# Patient Record
Sex: Female | Born: 1950
Health system: Southern US, Community
[De-identification: ages and names within clinical notes are randomized; demographics above are authoritative.]

## PROBLEM LIST (undated history)

## (undated) DIAGNOSIS — I10 Essential (primary) hypertension: Secondary | ICD-10-CM

## (undated) DIAGNOSIS — M1990 Unspecified osteoarthritis, unspecified site: Secondary | ICD-10-CM

## (undated) DIAGNOSIS — K219 Gastro-esophageal reflux disease without esophagitis: Secondary | ICD-10-CM

## (undated) DIAGNOSIS — R519 Headache, unspecified: Secondary | ICD-10-CM

## (undated) DIAGNOSIS — T7840XA Allergy, unspecified, initial encounter: Secondary | ICD-10-CM

## (undated) DIAGNOSIS — E7800 Pure hypercholesterolemia, unspecified: Secondary | ICD-10-CM

## (undated) DIAGNOSIS — I639 Cerebral infarction, unspecified: Secondary | ICD-10-CM

## (undated) DIAGNOSIS — E119 Type 2 diabetes mellitus without complications: Secondary | ICD-10-CM

## (undated) DIAGNOSIS — R51 Headache: Secondary | ICD-10-CM

## (undated) DIAGNOSIS — G4730 Sleep apnea, unspecified: Secondary | ICD-10-CM

## (undated) HISTORY — PX: KNEE ARTHROSCOPY: SUR90

## (undated) HISTORY — PX: TUBAL LIGATION: SHX77

## (undated) HISTORY — DX: Allergy, unspecified, initial encounter: T78.40XA

## (undated) HISTORY — PX: ABDOMINAL HYSTERECTOMY: SHX81

## (undated) HISTORY — DX: Unspecified osteoarthritis, unspecified site: M19.90

## (undated) HISTORY — PX: SHOULDER ARTHROSCOPY: SHX128

## (undated) HISTORY — PX: FOOT SURGERY: SHX648

---

## 1999-11-22 ENCOUNTER — Emergency Department (HOSPITAL_COMMUNITY): Admission: EM | Admit: 1999-11-22 | Discharge: 1999-11-22 | Payer: Self-pay | Admitting: *Deleted

## 2000-04-24 ENCOUNTER — Encounter: Admission: RE | Admit: 2000-04-24 | Discharge: 2000-04-24 | Payer: Self-pay | Admitting: Emergency Medicine

## 2000-04-24 ENCOUNTER — Encounter: Payer: Self-pay | Admitting: Emergency Medicine

## 2001-03-27 ENCOUNTER — Encounter: Payer: Self-pay | Admitting: Neurology

## 2001-03-27 ENCOUNTER — Encounter: Payer: Self-pay | Admitting: Emergency Medicine

## 2001-03-27 ENCOUNTER — Inpatient Hospital Stay (HOSPITAL_COMMUNITY): Admission: EM | Admit: 2001-03-27 | Discharge: 2001-03-29 | Payer: Self-pay | Admitting: Emergency Medicine

## 2002-07-14 ENCOUNTER — Encounter: Admission: RE | Admit: 2002-07-14 | Discharge: 2002-07-14 | Payer: Self-pay | Admitting: Hematology and Oncology

## 2002-07-14 ENCOUNTER — Encounter: Payer: Self-pay | Admitting: Internal Medicine

## 2002-07-16 HISTORY — PX: CHEST TUBE INSERTION: SHX231

## 2002-07-30 ENCOUNTER — Encounter: Admission: RE | Admit: 2002-07-30 | Discharge: 2002-07-30 | Payer: Self-pay | Admitting: Internal Medicine

## 2002-07-30 ENCOUNTER — Encounter: Payer: Self-pay | Admitting: Internal Medicine

## 2002-08-10 ENCOUNTER — Encounter: Payer: Self-pay | Admitting: Obstetrics and Gynecology

## 2002-08-10 ENCOUNTER — Ambulatory Visit (HOSPITAL_COMMUNITY): Admission: RE | Admit: 2002-08-10 | Discharge: 2002-08-10 | Payer: Self-pay | Admitting: Obstetrics and Gynecology

## 2002-10-21 ENCOUNTER — Ambulatory Visit (HOSPITAL_COMMUNITY): Admission: RE | Admit: 2002-10-21 | Discharge: 2002-10-21 | Payer: Self-pay | Admitting: Gastroenterology

## 2002-10-22 ENCOUNTER — Encounter: Payer: Self-pay | Admitting: Emergency Medicine

## 2002-10-22 ENCOUNTER — Encounter: Payer: Self-pay | Admitting: Neurology

## 2002-10-22 ENCOUNTER — Inpatient Hospital Stay (HOSPITAL_COMMUNITY): Admission: EM | Admit: 2002-10-22 | Discharge: 2002-10-23 | Payer: Self-pay | Admitting: Emergency Medicine

## 2002-10-23 ENCOUNTER — Encounter: Payer: Self-pay | Admitting: Neurology

## 2002-11-11 ENCOUNTER — Ambulatory Visit (HOSPITAL_COMMUNITY): Admission: RE | Admit: 2002-11-11 | Discharge: 2002-11-11 | Payer: Self-pay | Admitting: Neurology

## 2002-11-11 ENCOUNTER — Encounter: Payer: Self-pay | Admitting: Cardiology

## 2003-05-11 ENCOUNTER — Emergency Department (HOSPITAL_COMMUNITY): Admission: EM | Admit: 2003-05-11 | Discharge: 2003-05-12 | Payer: Self-pay | Admitting: Emergency Medicine

## 2003-07-17 DIAGNOSIS — I639 Cerebral infarction, unspecified: Secondary | ICD-10-CM

## 2003-07-17 HISTORY — DX: Cerebral infarction, unspecified: I63.9

## 2003-07-28 ENCOUNTER — Ambulatory Visit (HOSPITAL_COMMUNITY): Admission: RE | Admit: 2003-07-28 | Discharge: 2003-07-28 | Payer: Self-pay | Admitting: Orthopedic Surgery

## 2003-07-28 ENCOUNTER — Ambulatory Visit (HOSPITAL_BASED_OUTPATIENT_CLINIC_OR_DEPARTMENT_OTHER): Admission: RE | Admit: 2003-07-28 | Discharge: 2003-07-28 | Payer: Self-pay | Admitting: Orthopedic Surgery

## 2003-09-01 ENCOUNTER — Ambulatory Visit (HOSPITAL_COMMUNITY): Admission: RE | Admit: 2003-09-01 | Discharge: 2003-09-01 | Payer: Self-pay | Admitting: Orthopedic Surgery

## 2003-09-01 ENCOUNTER — Inpatient Hospital Stay (HOSPITAL_COMMUNITY): Admission: AD | Admit: 2003-09-01 | Discharge: 2003-09-05 | Payer: Self-pay | Admitting: Orthopedic Surgery

## 2003-09-01 ENCOUNTER — Ambulatory Visit (HOSPITAL_BASED_OUTPATIENT_CLINIC_OR_DEPARTMENT_OTHER): Admission: RE | Admit: 2003-09-01 | Discharge: 2003-09-01 | Payer: Self-pay | Admitting: Orthopedic Surgery

## 2003-09-08 ENCOUNTER — Ambulatory Visit (HOSPITAL_COMMUNITY): Admission: RE | Admit: 2003-09-08 | Discharge: 2003-09-08 | Payer: Self-pay | Admitting: Surgery

## 2003-09-30 ENCOUNTER — Encounter: Admission: RE | Admit: 2003-09-30 | Discharge: 2003-09-30 | Payer: Self-pay | Admitting: Surgery

## 2003-10-25 ENCOUNTER — Ambulatory Visit (HOSPITAL_BASED_OUTPATIENT_CLINIC_OR_DEPARTMENT_OTHER): Admission: RE | Admit: 2003-10-25 | Discharge: 2003-10-25 | Payer: Self-pay | Admitting: Orthopedic Surgery

## 2003-10-25 ENCOUNTER — Ambulatory Visit (HOSPITAL_COMMUNITY): Admission: RE | Admit: 2003-10-25 | Discharge: 2003-10-25 | Payer: Self-pay | Admitting: Orthopedic Surgery

## 2005-02-26 ENCOUNTER — Emergency Department (HOSPITAL_COMMUNITY): Admission: EM | Admit: 2005-02-26 | Discharge: 2005-02-26 | Payer: Self-pay | Admitting: Emergency Medicine

## 2009-11-03 ENCOUNTER — Encounter: Admission: RE | Admit: 2009-11-03 | Discharge: 2009-11-03 | Payer: Self-pay | Admitting: Internal Medicine

## 2010-07-04 ENCOUNTER — Encounter
Admission: RE | Admit: 2010-07-04 | Discharge: 2010-07-04 | Payer: Self-pay | Source: Home / Self Care | Attending: Internal Medicine | Admitting: Internal Medicine

## 2010-08-29 ENCOUNTER — Other Ambulatory Visit (HOSPITAL_COMMUNITY): Payer: BC Managed Care – PPO

## 2010-08-31 ENCOUNTER — Encounter (HOSPITAL_COMMUNITY)
Admission: RE | Admit: 2010-08-31 | Discharge: 2010-08-31 | Disposition: A | Discharge status: Home / Self Care | Payer: BC Managed Care – PPO | Source: Ambulatory Visit | Attending: Obstetrics and Gynecology | Admitting: Obstetrics and Gynecology

## 2010-08-31 DIAGNOSIS — Z01812 Encounter for preprocedural laboratory examination: Secondary | ICD-10-CM | POA: Insufficient documentation

## 2010-08-31 LAB — BASIC METABOLIC PANEL
BUN: 14 mg/dL (ref 6–23)
Chloride: 102 mEq/L (ref 96–112)
Glucose, Bld: 111 mg/dL — ABNORMAL HIGH (ref 70–99)
Potassium: 3.3 mEq/L — ABNORMAL LOW (ref 3.5–5.1)

## 2010-08-31 LAB — CBC
HCT: 43.8 % (ref 36.0–46.0)
MCV: 81.6 fL (ref 78.0–100.0)
RBC: 5.37 MIL/uL — ABNORMAL HIGH (ref 3.87–5.11)
WBC: 4.8 10*3/uL (ref 4.0–10.5)

## 2010-09-01 NOTE — H&P (Signed)
NAME:  [name], [name] NO.:  [id number]  MEDICAL RECORD NO.:  [id number]           PATIENT TYPE:  O  LOCATION:  SDC                           F[facility]ILITY:  [facility]  PHYSICIAN:  Osborn Coho, M.D.   DATE OF BIRTH:  04-07-1951  DATE OF ADMISSION: DATE OF DISCHARGE:                             HISTORY & PHYSICAL   HISTORY OF PRESENT ILLNESS:  [name] is a 60 year old single black female para 2-0-0-2, presenting for hysteroscopy D and C because of postmenopausal bleeding.  Over the past year, the patient reports that she has had sporadic vaginal bleeding that will last for 3-4 days at a time, requiring her to change a pad every 2-3 hours.  These bleeding episodes were accompanied by cramping that she rates as an 8/10 on a 10- point pain scale. she was able to find some relief however,  by taking 2 Aleve, decreasing her pain to 3/10 on a 10-point pain scale.  She admits to occasional constipation but denies any urinary tract symptoms, vaginitis symptoms, back pain, or fever.  Pelvic ultrasound on January 2012 showed a uterus measuring 13.0 cm x 11.1 cm x 11.8 cm with an endometrium that was not able to be assessed due to the position and size of a posterior fibroid that measured 10.1 x 8.83 x 8.52 cm.  The patient's ovaries appeared normal bilaterally.  The patient also had a TSH that returned normal as did her CBC.  An endometrial biopsy showed the fragments of benign endocervical mucosa in the background of mucous but no diagnostic endometrial tissue.  Given the patient's menopausal state and the presentation of vaginal bleeding, she has consented to proceed with hysteroscopy D and C for evaluation and management of these symptoms.  PAST MEDICAL HISTORY:  OB History:  Gravida 2, para 2-0-0-2.  The patient's deliveries were all vaginal. GYN History:  Menarche at 60 years old.  The patient is menopausal.  She has undergone bilateral tubal ligation.  Denies any history  of sexually transmitted diseases or abnormal Pap smears.  Her most recent Pap smear was in December 2011. Medical History:  Hypertension, migraines, diabetes mellitus, "mini" stroke, autoimmune disease affecting her cervical spine, and gastroesophageal reflux disease. Surgical History:  In 1995, left foot surgery; in 1996, right foot surgery; in 1996, bilateral tubal ligation; in 2004, right shoulder, left knee, and lung surgeries (due to lung perforation).  Denies any history of blood transfusions or problems with anesthesia.  FAMILY HISTORY:  Asthma, liver and ovarian cancer, autoimmune diseases, hypertension, anemia, diabetes, seizures, strokes, and anxiety.  SOCIAL HISTORY:  The patient is single and she works as a Naval architect at [employer]  Habits, she does not use tobacco or illicit drugs.  She will occasionally consume alcohol.  CURRENT MEDICATIONS: 1. Vitamin D 1 tablet daily. 2. Multivitamin 1 tablet daily. 3. Benicar 40/25 mg daily. 4. Lipitor 20 mg daily. 5. Victoza 1.8 mg. 6. Metformin 500 mg daily. 7. Prilosec 20 mg daily as needed.  The patient has no known drug allergies, however, she does have a  sensitivity to CODEINE and VICODIN, both of which cause nausea and PERCOCET that causes hallucinations.  She denies sensitivities to peanuts, soy, shellfish, or latex.  REVIEW OF SYSTEMS:  The patient does wear glasses and complains occasionally of left ankle edema.  She denies chest pain, shortness of breath, headache, vision changes, difficulty swallowing, nasal congestion, skin rashes, and except as mentioned in history present illness, the patient's review of systems is otherwise negative.  PHYSICAL EXAMINATION:  VITAL SIGNS:  Blood pressure 120/72, pulse is 70, respirations 16, temperature 96.9 degrees Fahrenheit orally, weight 261 pounds, height 5 feet 6-1/4 inches.  Body mass index 41.8. NECK:  Supple without masses. HEART:  Regular  rate and rhythm. LUNGS:  Clear. BACK:  No CVA tenderness. ABDOMEN:  No tenderness, masses, rebound, or organomegaly. EXTREMITIES:  No clubbing, cyanosis, or edema. PELVIC:  EG/BUS is normal.  Vagina is normal.  Cervix is nontender without lesions.  Uterus appears approximately 14 weeks size, is irregular without tenderness.  Adnexa, no tenderness or masses.  IMPRESSION:  Postmenopausal bleeding.  DISPOSITION:  A discussion was held with the patient regarding indications for her procedure along with its risks which include but are not limited to reaction to anesthesia, damage to adjacent organs, infection, and excessive bleeding.  The patient was given an ACOG brochure on hysteroscopy.  She has consented to proceed with hysteroscopy D and C at [facility] on September 05, 2010, at 11:30 a.m.     [name], P.A.-C   ______________________________ Osborn Coho, M.D.    EJP/MEDQ  D:  08/29/2010  T:  08/30/2010  Job:  [id number]  Electronically Signed by Raylene Everts. on 08/30/2010 09:58:51 PM Electronically Signed by Osborn Coho M.D. on 09/01/2010 09:33:03 AM

## 2010-09-05 ENCOUNTER — Ambulatory Visit (HOSPITAL_COMMUNITY)
Admission: RE | Admit: 2010-09-05 | Discharge: 2010-09-05 | Disposition: A | Discharge status: Home / Self Care | Payer: BC Managed Care – PPO | Source: Ambulatory Visit | Attending: Obstetrics and Gynecology | Admitting: Obstetrics and Gynecology

## 2010-09-05 ENCOUNTER — Other Ambulatory Visit: Payer: Self-pay | Admitting: Obstetrics and Gynecology

## 2010-09-05 DIAGNOSIS — Z01812 Encounter for preprocedural laboratory examination: Secondary | ICD-10-CM | POA: Insufficient documentation

## 2010-09-05 DIAGNOSIS — N950 Postmenopausal bleeding: Secondary | ICD-10-CM | POA: Insufficient documentation

## 2010-09-05 DIAGNOSIS — Z01818 Encounter for other preprocedural examination: Secondary | ICD-10-CM | POA: Insufficient documentation

## 2010-09-05 DIAGNOSIS — I10 Essential (primary) hypertension: Secondary | ICD-10-CM | POA: Insufficient documentation

## 2010-09-05 LAB — GLUCOSE, CAPILLARY: Glucose-Capillary: 107 mg/dL — ABNORMAL HIGH (ref 70–99)

## 2010-10-12 NOTE — Op Note (Signed)
  [name], TUSING                  ACCOUNT NO.:  [id number]  MEDICAL RECORD NO.:  [id number]           PATIENT TYPE:  O  LOCATION:  [facility]                          [facility]  PHYSICIAN:  Osborn Coho, M.D.   DATE OF BIRTH:  09/23/1950  DATE OF PROCEDURE: DATE OF DISCHARGE:                              OPERATIVE REPORT   PREOPERATIVE DIAGNOSIS:  Postmenopausal bleeding.  POSTOPERATIVE DIAGNOSIS:  Postmenopausal bleeding.  PROCEDURES: 1. Hysteroscopy. 2. D and C.  ATTENDING SURGEON:  Osborn Coho, MD  ANESTHESIA:  General via LMA.  FINDINGS:  Uterus sounded to 14 cm, questionable septum near the fundus of the cavity.  SPECIMENS TO PATHOLOGY:  Endometrial curettings.  FLUIDS:  800 mL.  URINE OUTPUT:  Quantity sufficient via straight cath prior to procedure. Hysteroscopic fluid deficit of glycine 415 mL  ESTIMATED BLOOD LOSS:  Minimal.  COMPLICATIONS:  None.  PROCEDURE:  The patient was taken to the operating room after the risks, benefits, and alternatives were discussed with the patient.  The patient verbalized understanding, consent signed and witnessed.  The patient was placed under general anesthesia and prepped and draped in a normal sterile fashion in the dorsal lithotomy position.  A bivalved speculum was placed in the patient's vagina and the anterior lip of the cervix grasped with a single-tooth tenaculum.  The cervix was dilated for passage of the diagnostic hysteroscope and uterus sounded to 14 cm. Diagnostic hysteroscope introduced and the right cornua region extended approximately 3 cm beyond the left cornua region.  There appeared to be in the ostia and the right cornua; however, it appeared to be covered by endometrium and atrophy.  Mostly, the endometrium appeared to be atrophic; however, with the questionable septum at the fundus, the uterine cavity was somewhat difficult to assess completely.  Curettage was performed; however, uncertain  whether or not the right cornual region was sampled secondary to the nature of the abnormal-appearing cavity.Marland Kitchen Hysteroscope was introduced again and no obvious intracavitary lesions were noted.  There was minimal bleeding.  All instruments were removed. There was good hemostasis at tenaculum site.  Count was correct.  The patient tolerated the procedure well and was awaiting transfer to the recovery room in good condition.     Osborn Coho, M.D.     AR/MEDQ  D:  09/05/2010  T:  09/05/2010  Job:  [id number]  Electronically Signed by Osborn Coho M.D. on 10/12/2010 09:49:10 AM

## 2010-12-01 NOTE — Discharge Summary (Signed)
NAME:  BRYER, GOTTSCH                            ACCOUNT NO.:  [id number]   MEDICAL RECORD NO.:  [id number]                   PATIENT TYPE:  INP   LOCATION:  5710                                 FACILITY:  [facility]   PHYSICIAN:  Thornton Park. Daphine Deutscher, M.D.             DATE OF BIRTH:  1950-08-31   DATE OF ADMISSION:  09/01/2003  DATE OF DISCHARGE:  09/04/2003                                 DISCHARGE SUMMARY   ADMITTING DIAGNOSIS:  Right pneumothorax.   DISCHARGE DIAGNOSIS:  Right pneumothorax, status post right tube  thoracostomy with reexpansion.   COURSE IN THE HOSPITAL:  Debora Stockdale. Paterson is a 60 year old lady who was  undergoing a block at [facility] and following that, was found to have  a right complete pneumothorax.  A 28 chest tube was placed anterior in the  thorax, resulting in reexpansion of the lung.   The patient was transferred to [facility] and admitted by me.  X-rays the  first day showed a small air-fluid level inferiorly consistent with a  hemopneumothorax.  Dr. Ines Bloomer saw the patient in addition  regarding whether any further interventions might be necessary.  Because of  the size of her breasts, lateral approach would be much more complicated.  However, she seemed to stay reexpanded with her anterior chest tube.  CT  scan done showed primarily atelectasis in the right base.  The chest tube  was removed on the morning of September 04, 2003.  The plan at this time is a  repeat chest x-ray later today and if all goes well, she will be discharged  after lunch for a followup chest x-ray on Wednesday, September 08, 2003.  She  is not having any pain and is having no difficulty breathing after the  removal of the chest tube.  I would not necessarily plan on her taking  anything other than over-the-counter medications for pain.  Condition  improved.   FOLLOWUP:  The patient was instructed to follow up with Dr. Harvie Junior  to reschedule orthopedic intervention  and will see me as needed but we will  get this chest x-ray on Wednesday to see if there are any intermediate-term  sequelae of this pneumothorax.                                                Thornton Park Daphine Deutscher, M.D.    MBM/MEDQ  D:  09/04/2003  T:  09/04/2003  Job:  [id number]   cc:   Candyce Churn. Allyne Gee, M.D.  [address]  [address]  [place]  [place] [address]  Fax: [phone]   C. Lesia Sago, M.D.  [address]  [address]  [place]  [place] [address]  Fax: [phone]  Harvie Junior, M.D.  [address]  [place]  [place] [address]  Fax: [phone]   Ines Bloomer, M.D.  [address]  [place]  [place] [address]   [facility]

## 2010-12-01 NOTE — Discharge Summary (Signed)
NAME:  [name], [name]                            ACCOUNT NO.:  [id number]   MEDICAL RECORD NO.:  [id number]                   PATIENT TYPE:  INP   LOCATION:  3030                                 FACILITY:  [facility]   PHYSICIAN:  Marlan Palau, M.D.               DATE OF BIRTH:  January 11, 1951   DATE OF ADMISSION:  10/22/2002  DATE OF DISCHARGE:  10/23/2002                                 DISCHARGE SUMMARY   ADMISSION DIAGNOSES:  1. Onset of headache, left-sided numbness and weakness (transient). Rule out     TIA or migraine headaches.  2. History of migraine headaches.  3. Hypertension.   DISCHARGE DIAGNOSES:  1. Transient left-sided symptomatology, probable migraine headache.  2. History of migraines.  3. Hypertension.   PROCEDURES:  1. CT of the brain.  2. MRI of the brain.  3. MR-angiogram of intracranial and extracranial vessels.  4. A 2-D echocardiogram - to be completed.   COMPLICATIONS TO ABOVE PROCEDURES:  None.   HISTORY OF PRESENT ILLNESS:  The patient is a 60 year old right-handed black  female who was born 04/28/2051 with a history of episodes of left-sided  weakness and numbness that occurred on the day of admission.  The patient  was at work, fell while pushing a cart. When she got up, she noticed that  the left side was somewhat weak and numb.  The patient noted a bifrontal  headache as well. This cleared over the next 40-50 minutes. The patient  denied any visual field changes or loss of consciousness.  The patient was  still able to ambulate during the event. The patient reported similar  episodes associated with severe headache, nausea and vomiting with left-  sided clumsiness or numbness of the arm.  The patient will take Imitrex for  this, which seems to improve her symptomatology.  The patient was brought  into the hospital for further evaluation.  CT of the head initially was  unremarkable.   PAST MEDICAL HISTORY:  1. Transient episodes of left-sided  weakness and numbness, TIA versus     migraines.  2. History of migraine headaches.  3. Obesity.  4. Gastroesophageal reflux disease.  5. History of feet surgery for heel spurs bilaterally.  6. History of hypertension.   MEDICATIONS:  1. Aspirin 1 a day.  2. Prilosec 20 mg a day.  3. Atenolol 25 mg a day.  4. Lisinopril/hydrochlorothiazide 10/12.5 one a day.  5. Imitrex nasal spray if needed.  6. Allegra if needed.   ALLERGIES:  CODEINE.   SOCIAL HISTORY:  Does not smoke, drinks alcohol on occasion.   FAMILY HISTORY, PAST SURGICAL HISTORY, REVIEW OF SYSTEMS:  Please refer to  admission history and physical dictation.   LABORATORY DATA:  Notable for sodium 140, potassium 3.4, chloride 106, CO2  17, glucose 94. INR 1.0.  White count 4.4, hemoglobin 12.4, hematocrit 37.9,  MCV 78.7,  platelets 250.  Urinalysis revealed specific 1.024, pH 5.0, 0-2  red cells and white cells. Drug screen positive for benzodiazepines,  otherwise negative.  EKG revealed sinus bradycardia, otherwise normal EKG,  heart rate 57. CT of head is as above.   HOSPITAL COURSE:  The patient was admitted to the hospital for evaluation of  transient left-sided weakness, numbness and headache.  Patient had a normal  CT of the head, was admitted for further evaluation.  MRI scan of the brain  was sent out and appeared to be unremarkable, no true changes seen.  MR-  angiography of the cranial vessels was unremarkable.  MR-angiogram of the  carotid vessels and extra-cranial vessels showed evidence of some reduction  of the distal portion of the left vertebral artery, right vertebral artery  appears to be dominant.  Otherwise, patient has no significant abnormalities  in the cerebrovascular circulation.   DISPOSITION:  The patient is to be set-up for a 2-D echocardiogram, which  has not been done at the time of this dictation. Following the 2-D echo, the  patient will be discharged to home; taking aspirin one a day  325 mg tablets,  lisinopril/hydrochlorothiazide 10/12.5 mg daily, Allegra daily, Prilosec 20  mg a day.  The patient is go off of Imitrex, is to have no strenuous  activity for 3 weeks, to have a no added salt diet.  The patient is to  contact the office immediately if any further problems arise.                                               Marlan Palau, M.D.    CKW/MEDQ  D:  10/23/2002  T:  10/24/2002  Job:  [id number]   cc:   Candyce Churn. Allyne Gee, M.D.  [address]  [address]  [place]  [place] [address]  Fax: [phone]
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
## 2010-12-01 NOTE — Op Note (Signed)
   NAME:  [name], [name]                            ACCOUNT NO.:  [id number]   MEDICAL RECORD NO.:  [id number]                   PATIENT TYPE:  AMB   LOCATION:  ENDO                                 FACILITY:  [facility]   PHYSICIAN:  Anselmo Rod, M.D.               DATE OF BIRTH:  [birth year]   DATE OF PROCEDURE:  10/21/2002  DATE OF DISCHARGE:                                 OPERATIVE REPORT   PROCEDURE:  Screening colonoscopy.   ENDOSCOPIST:  Anselmo Rod, M.D.   INSTRUMENT USED:  Olympus video colonoscope.   INDICATION FOR PROCEDURE:  A 60 year old African-American female undergoing  screening colonoscopy.  Rule out colonic polyps, masses, etc.   PREPROCEDURE PREPARATION:  Informed consent was procured from the patient.  The patient was fasted for eight hours prior to the procedure and prepped  with a bottle of Miralax and Gatorade the night prior to the procedure.   PREPROCEDURE PHYSICAL:  VITAL SIGNS:  The patient had stable vital signs.  NECK:  Supple.  CHEST:  Clear to auscultation.  S1, S2 regular.  ABDOMEN:  Soft with normal bowel sounds.   DESCRIPTION OF PROCEDURE:  The patient was placed in the left lateral  decubitus position and sedated with 60 mg of Demerol and 6 mg of Versed  intravenously.  Once the patient was adequately sedate and maintained on low-  flow oxygen and continuous cardiac monitoring, the Olympus video colonoscope  was advanced from the rectum to the cecum and terminal ileum without  difficulty.  The patient had an excellent prep.  No masses, polyps,  erosions, ulcerations, or diverticula were seen.  Small, nonbleeding  internal hemorrhoids were appreciated from retroflexion in the rectum.   IMPRESSION:  Normal colonoscopy up to the terminal ileum except for small,  nonbleeding internal hemorrhoids.   RECOMMENDATIONS:  1. A high-fiber diet with liberal fluid intake is advised.  2.     Repeat colorectal cancer screening is recommended in the next  10 years     unless the patient develops any abnormal symptoms in the interim.  3. Outpatient follow-up on a p.r.n. basis.                                               Anselmo Rod, M.D.    JNM/MEDQ  D:  10/21/2002  T:  10/21/2002  Job:  [id number]   cc:   Candyce Churn. Allyne Gee, M.D.  [address]  [address]  [place]  [place] [address]  Fax: [phone]

## 2010-12-01 NOTE — Discharge Summary (Signed)
[facility]  Patient:    [name] Visit Number: [id number] MRN: [id number]          Service Type: MED Location: 3W [id number] Attending Physician:  [name] Dictated by:   [name], M.D. Admit Date:  03/27/2001 Discharge Date: 03/29/2001   CC:         [name], M.D.  [name]. Love, M.D.   Discharge Summary  HISTORY OF PRESENT ILLNESS:  Briefly, [name] is a 60 year old right handed single female from [place]. It appears she has a 30-year history of hypertension and intermittent headaches occurring as often as one per month, usually in the bifrontal region associated with nausea and vomiting and thought to represent migraines. The patient presented on the morning of admission, September 12, with the onset of left supraorbital headache and she noted dizziness when trying to get out of bed without vomiting. She staggered on the way back from the bathroom, went back to bed, and later awoke with continued headache, dizziness, and lightheaded sensation. She drove to work and she noted the onset of weakness occurring in the left arm and left leg lasting about 20 minutes with some associated numbness in the left hand and left foot. The patient presented to the emergency room. Dr. Sandria White was asked to see her in neurologic consultation. On examination, blood pressure was 150/80 in the right arm and 160/80 in the left. Temperature was 99.5 degrees in the emergency room. Mental status was intact. Cranial nerve examination was unremarkable. Motor examination revealed no drift and good finger-nose-finger. Sensory examination was intact to pinprick, touch, joint position, and vibration testing. Deep tendon reflexes were symmetric. The patient was admitted with the diagnosis of headaches, vertigo, left-sided numbness--rule out TIA versus classic migraine.  LABORATORY FINDINGS:  The patients CBC and differential showed white  cell count of 4100, hemoglobin of 11.8 and hematocrit of 36.0. The patients platelet count was 295,000. Differential was unremarkable. Comprehensive metabolic panel on admission showed potassium 3.0, glucose 154, calcium 8.1, and other parameters were within normal limits. Repeat electrolytes on March 28, 2001 showed a normal potassium of 3.8, chloride 114, sodium 140 and CO2 24. Serial cardiac enzymes were within normal limits. Urine drug screen was negative. Urinalysis was unremarkable.  MRI of the brain without contrast on March 27, 2001 showed no evidence of acute CVA. MRA of the brain was normal.  Carotid Doppler studies showed no evidence of significant internal carotid artery stenosis. Vertebral artery flow was antegrade.  A 2-D echocardiogram on September 13 showed considerable soft tissue density adjacent to the left ventricle--probably epicardial fat. Ejection fraction was 55% to 65 %. Left ventricular wall thickness was at the upper limits of normal. No regional wall motion abnormalities were noted.  HOSPITAL COURSE:  The patient was admitted to the telemetry unit. Vital signs reflected intermittently elevated blood pressures in the maximum range of about 170/90. The patient could not recall the names of her antihypertensive medications and these were not administered. She was placed on hydrochlorothiazide. Vital signs were otherwise stable. Clinically, the patient did well. Her headache resolved for the most part over the first 24 hours and the left-sided numbness also resolved. She continued to complain of some mild dizziness when getting up and a mild headache intermittently for which she did not always require medications. On September 14 a.m., she was felt to have been received the maximal hospital benefit, was doing well and was ready for discharge. It  should be mentioned that her cholesterol level came back at that point at 121 with an HDL of 37.  FINAL  DIAGNOSES: 1. Probable classic migraine. 2. History of hypertension.  DISCHARGE MEDICATIONS:  Prevacid 20 mg p.o. q.d., Allegra one per day, patient was instructed to start back on her antihypertensives. She was given a small prescription for Darvocet-N 100 p.r.n. headache.  DISPOSITION:  The patient was discharged to home with instructions to follow up with Dr. Lorenz White on a routine basis. Her potassium was low on admission possibly reflecting the effect of diuretics. She will be asked to take some supplemental potassium in her diet and may need tablets of KCl. She will see Dr. Sandria White on an as-needed basis.  CONDITION ON DISCHARGE:  Improved.  PROGNOSIS:  Good. Dictated by:   [name], M.D. Attending Physician:  [name] DD:  03/29/01 TD:  03/29/01 Job: [id number]

## 2010-12-01 NOTE — H&P (Signed)
NAME:  [name], [name]                            ACCOUNT NO.:  [id number]   MEDICAL RECORD NO.:  [id number]                   PATIENT TYPE:  INP   LOCATION:  3030                                 FACILITY:  [facility]   PHYSICIAN:  Marlan Palau, M.D.               DATE OF BIRTH:  1951-04-04   DATE OF ADMISSION:  10/22/2002  DATE OF DISCHARGE:                                HISTORY & PHYSICAL   HISTORY OF PRESENT ILLNESS:  The patient is a 60 year old right-handed black  female, born Jan 20, 1951, with a history of obesity and hypertension and  prior history of migraine.  This patient claims that she was admitted to  [facility] approximately a year ago for a transient stroke event.  The patient does not recall what doctor was involved with that workup.  The  patient returns to the [facility] Emergency Room for an evaluation of a  similar event that occurred while at work today.  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  The patient noted immediately after the fall that she was somewhat  weak and numb on the left side of her body.  The patient was able to get up  and walk but had difficulty doing so.  The patient shortly thereafter began  noting a bifrontal headache and some pains down into the left face.  The  patient denied any visual field changes, double vision, loss of vision;  denied any loss of consciousness, speech problems.  The patient did feel  both numb and tingly as well as weak on the left arm and left leg and left  face.  The patient had resolution for the most part over the next hour or  so, still feels not quite normal two or three hours out after the above  event.  A CT scan of the brain was done and shows no acute changes.  Neurology was asked to see this patient for further evaluation.  The patient  reports a history of headaches a couple of times a week, a bad headache once  a month or so.  Many  of her bad headaches are associated with nausea and  vomiting and some numbness or clumsiness of the left arm.  The patient  usually takes Imitrex nasal spray for these events and seems to get  improvement.  The patient has had no nausea or vomiting today but claims the  headache she got after the onset of the deficit today was typical for her  usual migraine.  The patient is being admitted at this point to rule out a  TIA-type event, rule out migraine.   PAST MEDICAL HISTORY:  1. Transient episode of left-sided weakness and numbness, TIA versus     migraine.  2. History of migraine headaches.  3. Obesity.  4. Gastroesophageal reflux disease.  5. History of foot surgery bilaterally for heel spurs.  6. Hypertension.   MEDICATIONS:  1. Baby aspirin one a day.  2. Prevacid 20 mg a day.  3. Atenolol 25 mg a day.  4. Lisinopril/hydrochlorothiazide 10/12.5 mg one a day.  5. Imitrex nasal spray if needed.  6. Allegra.   ALLERGIES:  The patient states an allergy to CODEINE.   HABITS:  Does not smoke.  Drinks alcohol on occasion.   SOCIAL HISTORY:  This patient is divorced, lives in the [place] area, has  two children who are alive and well, works for the school system.   FAMILY MEDICAL HISTORY:  Family history is notable in that mother died with  cancer, father died with pneumonia.  The patient has one sister who is alive  and well.  One of the patient's sons has a history of migraine.  No other  family history of migraines noted.   REVIEW OF SYSTEMS:  Review of systems is notable for no fevers or chills.  The patient has a history of migraines, as above, and does note some neck  pain, no pain down the arms.  The patient notes some chest discomfort  associated with gastroesophageal reflux disease, does have occasional  shortness of breath, and nausea and vomiting associated with headache.  Denies any problems controlling the bowels or bladder.  Does have some  dizziness on and  off.   PHYSICAL EXAMINATION:  VITALS:  Blood pressure is 160/85, heart rate 58,  respiratory rate 20, temperature -- afebrile.  GENERAL:  In general, this patient is a moderately obese black female who is  alert and cooperative at the time of examination.  HEENT:  Head is atraumatic.  Eyes:  Pupils are equal, round and reactive to  light.  Disks are flat bilaterally.  NECK:  Neck supple.  No carotid bruits noted.  RESPIRATORY:  Examination is clear.  CARDIOVASCULAR:  Examination reveals a regular rate and rhythm with no  obvious murmurs or rubs noted.  ABDOMEN:  Abdomen reveals positive bowel sounds; no organomegaly or  tenderness are noted.  EXTREMITIES:  Extremities without significant edema.  NEUROLOGIC:  Cranial nerves as above.  Facial symmetry is present.  The  patient has some decreased pinprick sensation in the lower half of the left  face as compared to the right and more symmetric on the forehead.  The  patient has full extraocular movements.  Visual fields are full.  Speech is  normal and not aphasic.  Strength to facial muscles and the muscles with  head-turning and shoulder shrugs were symmetric and normal.  The patient has  good strength in all four extremities.  Good and symmetric motor tone is  noted throughout.  Sensory testing is intact to pinprick, soft touch and  vibratory sensation throughout.  No evidence of extinction is noted.  The  patient has fair finger-to-nose-to-finger and toe-to-finger bilaterally.  The patient is not ambulated.  The patient has good symmetric reflexes  throughout and depressed ankle jerk reflexes are noted bilaterally.  Toes  are neutral bilaterally.   No pronator drift is seen.   LABORATORY VALUES:  Laboratory values reveal a white count of 4.4,  hemoglobin 12.4, hematocrit of 37.9, MCV of 78.7, platelets of 250,000, INR  of 1.0.  Glucose is 94, BUN of 17, sodium 140, potassium 3.4, chloride of 106, CO2  30, creatinine of 1.2.    EKG revealed sinus bradycardia, otherwise, normal EKG, heart rate  of 57.   CT of the head is as above.   IMPRESSION:  1. Transient episode of left arm and left leg numbness and weakness, rule     out transient ischemic attack versus migraine headache.  2. History of hypertension.  3. History of migraine.   This patient has an unremarkable examination at this time.  The patient  gives a history of intermittent headaches associated with left upper  extremity symptomatology.  The patient has essentially resolved from the  above episode.  We will admit this patient for further workup.   PLAN:  1. Admission for TIA workup, 3000 unit, to monitor bed.  2. MRI of the brain.  3. MR angiogram of the intracranial and extracranial vessels.  4. Two-dimensional echocardiogram.  5. Aspirin and Plavix therapies.  6. No triptans for now.  7. The patient will receive IV fluid hydration.  8. Will follow the patient's clinical course while in house.                                               Marlan Palau, M.D.    CKW/MEDQ  D:  10/22/2002  T:  10/23/2002  Job:  [id number]   cc:   Candyce Churn. Allyne Gee, M.D.  [address]  [address]  [place]  [place] [address]  Fax: [phone]   [address], [address] [place], [place] [employer] [employer]

## 2010-12-01 NOTE — Op Note (Signed)
NAME:  [name], [name]                            ACCOUNT NO.:  [id number]   MEDICAL RECORD NO.:  [id number]                   PATIENT TYPE:  OUT   LOCATION:  DFTL                                 FACILITY:  [facility]   PHYSICIAN:  Harvie Junior, M.D.                DATE OF BIRTH:  April 14, 1951   DATE OF PROCEDURE:  07/28/2003  DATE OF DISCHARGE:  09/01/2003                                 OPERATIVE REPORT   PREOPERATIVE DIAGNOSIS:  Medial meniscal tear with chondromalacia patella.   POSTOPERATIVE DIAGNOSIS:  Medial meniscal tear with chondromalacia patella.   OPERATION PERFORMED:  Left knee arthroscopy with partial posterior horn  medial meniscectomy and chondroplasty of the patellofemoral joint.   SURGEON:  Harvie Junior, M.D.   ASSISTANT:  Marshia Ly, P.A.   ANESTHESIA:  General laryngeal mask.   INDICATIONS FOR PROCEDURE:  The patient is a 60 year old female with a long  history of having knee pain.  We evaluated her in the office and she was  felt to have a medial meniscal tear and some chondromalacia.  She was  brought to the operating room because of failure of all conservative care  for treatment of these conditions.   DESCRIPTION OF PROCEDURE:  The patient was taken to the operating room and  after adequate anesthesia was obtained with general anesthetic, the patient  was placed supine on the operating table.  The left leg was prepped and  draped in the usual sterile fashion.  Routine arthroscopic examination of  the knee revealed that there was an obvious posterior horn medial meniscal  tear.  This was debrided back to a smooth and stable rim.  About 15% of the  total body of the meniscus had been removed.  At that point, attention was  turned to the medial femoral condyle with some grade 2 changes.  Attention  was then turned up into the patellofemoral joint where there was noted to be  some fairly significant chondromalacia on the posterior aspect of the  patella.  This  was debrided with a shaver to a smooth and stable rim. The  lateral side was evaluated and felt to be normal.  The anterior cruciate was  within normal limits.  At this point the knee was copiously irrigated and  suctioned dry.  Arthroscopic portals were closed with bandage.  Sterile  compressive dressing was applied.  The patient was taken to the recovery  room where she was noted to be in satisfactory condition.  The estimated  blood loss for this procedure was none.                                               Harvie Junior, M.D.    Ranae Plumber  D:  10/18/2003  T:  10/19/2003  Job:  [id number]

## 2010-12-01 NOTE — H&P (Signed)
NAME:  ARENA, LINDAHL                            ACCOUNT NO.:  [id number]   MEDICAL RECORD NO.:  [id number]                   PATIENT TYPE:  INP   LOCATION:  5710                                 FACILITY:  [facility]   PHYSICIAN:  Thornton Park. Daphine Deutscher, M.D.             DATE OF BIRTH:  1951/04/02   DATE OF ADMISSION:  09/01/2003  DATE OF DISCHARGE:                                HISTORY & PHYSICAL   CHIEF COMPLAINT:  Right pneumothorax.   HISTORY OF PRESENT ILLNESS:  Ms. Destry Bezdek is a 60 year old black female  who was planned for right shoulder arthroscopy with arthroscopic  acromioplasty and distal clavicle resection by Dr. Luiz Blare on September 01, 2003 at the [facility] Day [facility].  Following the block of the shoulder, she  developed shortness of breath and was found on the chest x-ray to have a  complete right pneumothorax.  I was in the facility and available, and  inserted a right chest tube to reexpand her lung and admitted her at this  time for further observation.   PAST MEDICAL HISTORY:  Positive for intolerance of CODEINE.   CURRENT MEDICATIONS:  1. Cardizem.  2. Lisinopril.  3. Prilosec.  4. Tylenol p.r.n.   SOCIAL HISTORY:  The patient does not use drugs but drinks occasionally.  She does not smoke.   REVIEW OF SYSTEMS:  Positive for hypertension, migraine headaches, some  question of a mini-stroke one year ago treated by Dr. Lesia Sago and  esophageal reflux.   PRIMARY CARE PHYSICIAN:  Dr. Dorothyann Peng, and she sees Dr. Lesia Sago.   PHYSICAL EXAMINATION:  VITAL SIGNS:  At the time seen over there her blood  pressure was 152/81, heart rate 63, respirations 20, afebrile.  Height 5  feet, 7 inches, weight 220 pounds.  HEENT:  Exam unremarkable.  NECK:  Trachea midline.  RESPIRATORY:  Breath sounds markedly diminished on the right prior to chest  tube, then breath sounds present bilaterally.  Large breast, obscuring  lateral access to the thorax so anterior chest  tube was placed on the right.  ABDOMEN:  Nontender.  MUSCULOSKELETAL:  There is limitation of the right shoulder for which she is  scheduled to have her aforementioned operation.  NEUROLOGIC:  Alert and oriented x3.  Motor and sensory function grossly  intact, and she is a pleasant and appropriate lady.   IMPRESSION:  Right pneumothorax.   TREATMENT:  Right chest tube, now for admission for follow up and hopefully  removal soon.                                                Thornton Park Daphine Deutscher, M.D.    MBM/MEDQ  D:  09/04/2003  T:  09/05/2003  Job:  [id number]  cc:   Harvie Junior, M.D.  [address]  [place]  [place] [address]  Fax: [phone]

## 2010-12-01 NOTE — Op Note (Signed)
NAME:  [name], [name]                            ACCOUNT NO.:  [id number]   MEDICAL RECORD NO.:  [id number]                   PATIENT TYPE:  AMB   LOCATION:  DSC                                  FACILITY:  [facility]   PHYSICIAN:  Harvie Junior, M.D.                DATE OF BIRTH:  05-23-1951   DATE OF PROCEDURE:  10/25/2003  DATE OF DISCHARGE:                                 OPERATIVE REPORT   PREOPERATIVE DIAGNOSIS:  Pain, right shoulder.   POSTOPERATIVE DIAGNOSIS:  Pain, right shoulder.   OPERATION PERFORMED:  1. Right subacromial decompression.  2. Distal clavicle resection.  3. Debridement of superior labral tear, anterior to posterior.   SURGEON:  Harvie Junior, M.D.   ASSISTANT:  Marshia Ly, P.A.   ANESTHESIA:  General.   INDICATIONS FOR PROCEDURE:  The patient is a 60 year old female with a  history of having significant right shoulder pain.  She failed all  conservative measures and had continuous complaints of impingement resolved  with injection therapy.  X-ray showed type 3 acromion, significant  acromioclavicular joint arthritis.  Because of continued complaints of pain  and failure of conservative care she is ultimately taken to the operating  room for subacromial decompression.  She had a difficult time with her  initial attempt at surgery with a difficult airway but these issues were  corrected and at this point she has come back for surgical intervention.   DESCRIPTION OF PROCEDURE:  The patient was taken to the operating room and  after adequate anesthesia obtained with general anesthetic, the patient was  placed supine on the operating table and moved to the beach chair position.  All bony prominences well padded.  Attention was turned to the right  shoulder where after routine prep and drape, attention was turned to the  glenohumeral joint.  The arthroscope was placed into the glenohumeral joint  and showed that there was some significant biceps laxity  superiorly with  fray of the articular surface just inferior to the biceps.  The biceps was  anchored well but the labrum was somewhat torn superiorly.  This was  debrided with a suction shaver back to a smooth and stable rim.  Attention  was turned to the glenohumeral joint where there were also some changes.  Attention was turned to the rotator cuff where there was some fray at the  rotator cuff insertion.  At this point, attention was turned out of the  glenohumeral joint into the subacromial space and anterolateral  acromioplasty was performed from both the lateral and posterior compartment.  Attention was then turned to the distal clavicle where 15 mm of distal  clavicle was resected from the anterior portal.  At this point the rotator  cuff had a significant bursectomy undertaken.  This bursectomy revealed no  full thickness rotator cuff tear and at this point  the shoulder was copiously  irrigated and suctioned dry.  Arthroscope portals  were closed with a bandage.  Sterile compressive dressing was applied.  The  patient was taken to the recovery room where she was noted to be in  satisfactory condition.  The estimated blood loss for this procedure was  none.                                               Harvie Junior, M.D.    Ranae Plumber  D:  10/25/2003  T:  10/26/2003  Job:  [id number]

## 2011-08-27 ENCOUNTER — Other Ambulatory Visit: Payer: Self-pay | Admitting: Internal Medicine

## 2011-08-27 DIAGNOSIS — Z1231 Encounter for screening mammogram for malignant neoplasm of breast: Secondary | ICD-10-CM

## 2012-09-23 ENCOUNTER — Ambulatory Visit
Admission: RE | Admit: 2012-09-23 | Discharge: 2012-09-23 | Disposition: A | Discharge status: Home / Self Care | Payer: BC Managed Care – PPO | Source: Ambulatory Visit | Attending: Internal Medicine | Admitting: Internal Medicine

## 2012-09-23 DIAGNOSIS — Z1231 Encounter for screening mammogram for malignant neoplasm of breast: Secondary | ICD-10-CM

## 2012-10-07 ENCOUNTER — Other Ambulatory Visit (HOSPITAL_COMMUNITY)
Admission: RE | Admit: 2012-10-07 | Discharge: 2012-10-07 | Disposition: A | Discharge status: Home / Self Care | Payer: BC Managed Care – PPO | Source: Ambulatory Visit | Attending: Obstetrics and Gynecology | Admitting: Obstetrics and Gynecology

## 2012-10-07 ENCOUNTER — Other Ambulatory Visit: Payer: Self-pay | Admitting: Obstetrics and Gynecology

## 2012-10-07 DIAGNOSIS — Z01419 Encounter for gynecological examination (general) (routine) without abnormal findings: Secondary | ICD-10-CM | POA: Insufficient documentation

## 2012-10-07 DIAGNOSIS — Z1151 Encounter for screening for human papillomavirus (HPV): Secondary | ICD-10-CM | POA: Insufficient documentation

## 2012-10-09 ENCOUNTER — Other Ambulatory Visit: Payer: Self-pay | Admitting: Obstetrics and Gynecology

## 2012-10-22 LAB — HM COLONOSCOPY

## 2013-01-28 ENCOUNTER — Encounter (HOSPITAL_COMMUNITY): Payer: Self-pay | Admitting: Pharmacist

## 2013-01-30 ENCOUNTER — Encounter (HOSPITAL_COMMUNITY)
Admission: RE | Admit: 2013-01-30 | Discharge: 2013-01-30 | Disposition: A | Discharge status: Home / Self Care | Payer: BC Managed Care – PPO | Source: Ambulatory Visit | Attending: Obstetrics and Gynecology | Admitting: Obstetrics and Gynecology

## 2013-01-30 ENCOUNTER — Encounter (HOSPITAL_COMMUNITY): Payer: Self-pay

## 2013-01-30 HISTORY — DX: Pure hypercholesterolemia, unspecified: E78.00

## 2013-01-30 HISTORY — DX: Sleep apnea, unspecified: G47.30

## 2013-01-30 HISTORY — DX: Type 2 diabetes mellitus without complications: E11.9

## 2013-01-30 HISTORY — DX: Headache: R51

## 2013-01-30 HISTORY — DX: Cerebral infarction, unspecified: I63.9

## 2013-01-30 HISTORY — DX: Essential (primary) hypertension: I10

## 2013-01-30 HISTORY — DX: Gastro-esophageal reflux disease without esophagitis: K21.9

## 2013-01-30 LAB — BASIC METABOLIC PANEL
BUN: 13 mg/dL (ref 6–23)
Chloride: 102 mEq/L (ref 96–112)
Creatinine, Ser: 0.71 mg/dL (ref 0.50–1.10)
GFR calc Af Amer: >90 mL/min (ref >90)

## 2013-01-30 LAB — CBC
HCT: 41.4 % (ref 36.0–46.0)
MCHC: 31.9 g/dL (ref 30.0–36.0)
RDW: 14.7 % (ref 11.5–15.5)

## 2013-01-30 NOTE — Patient Instructions (Signed)
Your procedure is scheduled on:7/21/!4  Enter through the Main Entrance at :1230 pm Pick up desk phone and dial [phone] and inform us of your arrival.  Please call [phone] if you have any problems the morning of surgery.  Remember: Do not eat food after midnight: Sunday Clear liquids are ok until:10 am Monday   You may brush your teeth the morning of surgery.  Take these meds the morning of surgery with a sip of water:Nexiuim, Benicar  DO NOT wear jewelry, eye make-up, lipstick,body lotion, or dark fingernail polish.  (Polished toes are ok) You may wear deodorant.  If you are to be admitted after surgery, leave suitcase in car until your room has been assigned. Patients discharged on the day of surgery will not be allowed to drive home. Wear loose fitting, comfortable clothes for your ride home.

## 2013-02-01 NOTE — H&P (Signed)
History of Present Illness  General:  62 y/o G2P2 presents for f/u on PMB. Pt with h/o PMB. Ultrasound with large uterine fibroid, 10 cm. Declined hysterectomy. EMB done 09/2012, which showed benign proliferative endometrium. Spotted a few weeks after that. May full period x 14 days, first week she passed clots and had pain. June she has intermittently spotted. Pt ready to proceed with hysterectomy. Denies weight loss, nausea/vomiting/diarrhea. RLQ soreness.   Current Medications  Taking Benicar HCT 40-25 MG Tablet 1 tablet Once a day  Taking Kombiglyze XR 11-998 MG Tablet Extended Release 24 Hour 1 tablet with evening meal Once a day  Taking Lipitor 20 MG Tablet 1 tablet Once a day  Taking MVI 1 tab   Taking Allegra-D 24 Hour 180-240 MG Tablet Extended Release 24 Hour 1 tablet Once a day  Taking Glucosamine Chondr 1500 Complx Capsule   Taking Vitamin D 1000 UNIT Tablet 1 tablet Once a day  Taking Nexium 20 MG Capsule Delayed Release 1 capsule Once a day  Not-Taking/PRN Aleve 220 MG Tablet 1 tablet every 12 hrs  Not-Taking/PRN Voltaren Gel 3 Pack 1% Gel Apply 2 gm per upper extremity joint q 6 hrs  Not-Taking/PRN Vicodin ES 5-300 MG Tablet 1 tablet as needed for pain every 6 hrs  Not-Taking/PRN Benadryl Allergy 25 MG Tablet 1 tablet every 6 hrs  Medication List reviewed and reconciled with the patient   Past Medical History  Hypertension  type II diabetes  Hyperlipidemia  Plantar fasciitis  Obesity  Hypercholesterolemia  Postmenopause   Surgical History  left knee surgery   right shoulder surgery -   surgery on both feet    Family History  Father: deceased  Mother: deceased, Died from MI, diagnosed with Hypertension  Maternal Grand Mother: deceased, pancreatic cancer  denies any GYN family cancer hx Maternal grandmother with pancreatic cancer Denies familyhx colon cancer, colon polyps or liver disease.   Social History  General:  Tobacco use  cigarettes: Never smoked no  Smoking.  Alcohol: yes, occasionally.  no Recreational drug use.  Exercise: starting to walk.  Occupation: Works for [employer] in Engineer, agricultural, retired.  Marital Status: Divorced.  Children: Boys, 1, girls, 1.    Gyn History  Periods : postmenopausal, 2009.  LMP spotting off and on last 3 months.  Denies H/O Birth control.  Last pap smear date 10/07/12, all negative.  Last mammogram date 2-3 weeks ago, 09/2012.  Abnormal pap smear none.  STD none.  GYN procedures Endometrial Biopsy, benign.    OB History  Number of pregnancies 2.  Pregnancy # 1 live birth, vaginal delivery.  Pregnancy # 2 live birth, vaginal delivery.    Allergies  Codeine (for allergy): hives/rash: Allergy   Hospitalization/Major Diagnostic Procedure  childbirth x 2   puncture lung during shoulder surgery    Vital Signs  Wt 267, Wt change -2 lb, Ht 66.5, BMI 42.44, BP sitting 146/92.   Physical Examination  GENERAL:  General Appearance: well-appearing, well-nourished, no acute distress, alert and oriented.  LUNGS:  General clear bilaterally, no crackles, no wheezes.  HEART:  Heart sounds: normal, RRR.  ABDOMEN:  General: Fibroid palpated to one fB below the umbilicus to the right. Nontender. RLQ pain in right inguinal canal..  EXTREMITIES:  General: Trace edema, no calf tenderness.     Assessments   1. Postmenopausal bleeding - 627.1 (Primary), Persisting.   2. Uterine fibroid - 218.9   Treatment  1. Postmenopausal bleeding  Notes: Repeat  ultrasound to assess growth of fibroid and EMS. Recommend D&C for a more definitive sampling, especially if EMS suspicious. Persistant PMB worrisome despite normal EMB. Pt informed of possible Gyn/Onc referral. Discussed risk of morcellation. Pt verbalized understanding.    Follow Up  2 Weeks post op

## 2013-02-02 ENCOUNTER — Encounter (HOSPITAL_COMMUNITY)
Admission: RE | Disposition: A | Discharge status: Home / Self Care | Payer: Self-pay | Source: Ambulatory Visit | Attending: Obstetrics and Gynecology

## 2013-02-02 ENCOUNTER — Ambulatory Visit (HOSPITAL_COMMUNITY)
Admission: RE | Admit: 2013-02-02 | Discharge: 2013-02-02 | Disposition: A | Discharge status: Home / Self Care | Payer: BC Managed Care – PPO | Source: Ambulatory Visit | Attending: Obstetrics and Gynecology | Admitting: Obstetrics and Gynecology

## 2013-02-02 ENCOUNTER — Ambulatory Visit (HOSPITAL_COMMUNITY): Payer: BC Managed Care – PPO | Admitting: Anesthesiology

## 2013-02-02 ENCOUNTER — Encounter (HOSPITAL_COMMUNITY): Payer: Self-pay | Admitting: *Deleted

## 2013-02-02 ENCOUNTER — Encounter (HOSPITAL_COMMUNITY): Payer: Self-pay | Admitting: Anesthesiology

## 2013-02-02 DIAGNOSIS — E119 Type 2 diabetes mellitus without complications: Secondary | ICD-10-CM | POA: Insufficient documentation

## 2013-02-02 DIAGNOSIS — M1990 Unspecified osteoarthritis, unspecified site: Secondary | ICD-10-CM | POA: Insufficient documentation

## 2013-02-02 DIAGNOSIS — Z8673 Personal history of transient ischemic attack (TIA), and cerebral infarction without residual deficits: Secondary | ICD-10-CM | POA: Insufficient documentation

## 2013-02-02 DIAGNOSIS — Z6841 Body Mass Index (BMI) 40.0 and over, adult: Secondary | ICD-10-CM | POA: Insufficient documentation

## 2013-02-02 DIAGNOSIS — Z885 Allergy status to narcotic agent status: Secondary | ICD-10-CM | POA: Insufficient documentation

## 2013-02-02 DIAGNOSIS — G4730 Sleep apnea, unspecified: Secondary | ICD-10-CM | POA: Insufficient documentation

## 2013-02-02 DIAGNOSIS — Z79899 Other long term (current) drug therapy: Secondary | ICD-10-CM | POA: Insufficient documentation

## 2013-02-02 DIAGNOSIS — N950 Postmenopausal bleeding: Secondary | ICD-10-CM | POA: Insufficient documentation

## 2013-02-02 DIAGNOSIS — N952 Postmenopausal atrophic vaginitis: Secondary | ICD-10-CM | POA: Insufficient documentation

## 2013-02-02 DIAGNOSIS — K219 Gastro-esophageal reflux disease without esophagitis: Secondary | ICD-10-CM | POA: Insufficient documentation

## 2013-02-02 DIAGNOSIS — D259 Leiomyoma of uterus, unspecified: Secondary | ICD-10-CM | POA: Insufficient documentation

## 2013-02-02 HISTORY — PX: HYSTEROSCOPY WITH D & C: SHX1775

## 2013-02-02 LAB — GLUCOSE, CAPILLARY: Glucose-Capillary: 124 mg/dL — ABNORMAL HIGH (ref 70–99)

## 2013-02-02 SURGERY — DILATATION AND CURETTAGE /HYSTEROSCOPY
Anesthesia: General | Site: Vagina | Wound class: Clean Contaminated

## 2013-02-02 MED ORDER — ONDANSETRON HCL 4 MG/2ML IJ SOLN
INTRAMUSCULAR | Status: DC | PRN
  Administered 2013-02-02: 4 mg via INTRAVENOUS

## 2013-02-02 MED ORDER — MIDAZOLAM HCL 2 MG/2ML IJ SOLN
INTRAMUSCULAR | Status: AC
  Filled 2013-02-02: qty 2

## 2013-02-02 MED ORDER — FENTANYL CITRATE 0.05 MG/ML IJ SOLN
INTRAMUSCULAR | Status: DC | PRN
  Administered 2013-02-02: 100 ug via INTRAVENOUS
  Administered 2013-02-02 (×2): 50 ug via INTRAVENOUS

## 2013-02-02 MED ORDER — LACTATED RINGERS IV SOLN
INTRAVENOUS | Status: DC
  Administered 2013-02-02 (×2): via INTRAVENOUS

## 2013-02-02 MED ORDER — DEXAMETHASONE SODIUM PHOSPHATE 10 MG/ML IJ SOLN
INTRAMUSCULAR | Status: AC
  Filled 2013-02-02: qty 1

## 2013-02-02 MED ORDER — IBUPROFEN 600 MG PO TABS: 600.0000 mg | ORAL_TABLET | Freq: Four times a day (QID) | ORAL | Status: DC | PRN

## 2013-02-02 MED ORDER — FENTANYL CITRATE 0.05 MG/ML IJ SOLN
INTRAMUSCULAR | Status: AC
  Filled 2013-02-02: qty 2

## 2013-02-02 MED ORDER — CEFAZOLIN SODIUM-DEXTROSE 2-3 GM-% IV SOLR
INTRAVENOUS | Status: AC
  Administered 2013-02-02: 3 g via INTRAVENOUS
  Filled 2013-02-02: qty 50

## 2013-02-02 MED ORDER — KETOROLAC TROMETHAMINE 30 MG/ML IJ SOLN
INTRAMUSCULAR | Status: AC
  Filled 2013-02-02: qty 1

## 2013-02-02 MED ORDER — PROPOFOL 10 MG/ML IV EMUL
INTRAVENOUS | Status: AC
  Filled 2013-02-02: qty 20

## 2013-02-02 MED ORDER — DEXTROSE 5 % IV SOLN: 3.0000 g | INTRAVENOUS | Status: DC

## 2013-02-02 MED ORDER — LIDOCAINE HCL 2 % IJ SOLN
INTRAMUSCULAR | Status: AC
  Filled 2013-02-02: qty 20

## 2013-02-02 MED ORDER — LIDOCAINE HCL (CARDIAC) 20 MG/ML IV SOLN
INTRAVENOUS | Status: AC
  Filled 2013-02-02: qty 5

## 2013-02-02 MED ORDER — CEFAZOLIN SODIUM-DEXTROSE 2-3 GM-% IV SOLR: 2.0000 g | Freq: Once | INTRAVENOUS | Status: DC

## 2013-02-02 MED ORDER — PROPOFOL 10 MG/ML IV BOLUS
INTRAVENOUS | Status: DC | PRN
  Administered 2013-02-02: 200 mg via INTRAVENOUS

## 2013-02-02 MED ORDER — LIDOCAINE HCL 2 % IJ SOLN
INTRAMUSCULAR | Status: DC | PRN
  Administered 2013-02-02: 10 mL

## 2013-02-02 MED ORDER — DEXAMETHASONE SODIUM PHOSPHATE 10 MG/ML IJ SOLN
INTRAMUSCULAR | Status: DC | PRN
  Administered 2013-02-02: 10 mg via INTRAVENOUS

## 2013-02-02 MED ORDER — MIDAZOLAM HCL 5 MG/5ML IJ SOLN
INTRAMUSCULAR | Status: DC | PRN
  Administered 2013-02-02: 2 mg via INTRAVENOUS

## 2013-02-02 MED ORDER — ONDANSETRON HCL 4 MG/2ML IJ SOLN
INTRAMUSCULAR | Status: AC
  Filled 2013-02-02: qty 2

## 2013-02-02 MED ORDER — SODIUM CHLORIDE 0.9 % IR SOLN
Status: DC | PRN
  Administered 2013-02-02: 3000 mL

## 2013-02-02 MED ORDER — KETOROLAC TROMETHAMINE 30 MG/ML IJ SOLN
INTRAMUSCULAR | Status: DC | PRN
  Administered 2013-02-02: 30 mg via INTRAVENOUS

## 2013-02-02 MED ORDER — LIDOCAINE HCL (CARDIAC) 20 MG/ML IV SOLN
INTRAVENOUS | Status: DC | PRN
  Administered 2013-02-02: 50 mg via INTRAVENOUS

## 2013-02-02 SURGICAL SUPPLY — 14 items
CANISTER SUCTION 2500CC (MISCELLANEOUS) ×2 IMPLANT
CATH ROBINSON RED A/P 16FR (CATHETERS) ×2 IMPLANT
CLOTH BEACON ORANGE TIMEOUT ST (SAFETY) ×2 IMPLANT
CONTAINER PREFILL 10% NBF 60ML (FORM) ×2 IMPLANT
DILATOR CANAL MILEX (MISCELLANEOUS) IMPLANT
DRESSING TELFA 8X3 (GAUZE/BANDAGES/DRESSINGS) ×2 IMPLANT
GLOVE BIO SURGEON STRL SZ7 (GLOVE) ×2 IMPLANT
GLOVE BIOGEL PI IND STRL 7.0 (GLOVE) ×1 IMPLANT
GLOVE BIOGEL PI INDICATOR 7.0 (GLOVE) ×1
GOWN STRL REIN XL XLG (GOWN DISPOSABLE) ×4 IMPLANT
PACK HYSTEROSCOPY LF (CUSTOM PROCEDURE TRAY) ×2 IMPLANT
PAD OB MATERNITY 4.3X12.25 (PERSONAL CARE ITEMS) ×2 IMPLANT
TOWEL OR 17X24 6PK STRL BLUE (TOWEL DISPOSABLE) ×4 IMPLANT
WATER STERILE IRR 1000ML POUR (IV SOLUTION) ×2 IMPLANT

## 2013-02-02 NOTE — Anesthesia Postprocedure Evaluation (Signed)
Anesthesia Post Note  Patient: [name]  Procedure(s) Performed: Procedure(s) (LRB): DILATATION AND CURETTAGE /HYSTEROSCOPY (N/A)  Anesthesia type: General  Patient location: PACU  Post pain: Pain level controlled  Post assessment: Post-op Vital signs reviewed  Last Vitals:  Filed Vitals:   02/02/13 1515  BP: 145/89  Pulse: 73  Temp:   Resp: 13    Post vital signs: Reviewed  Level of consciousness: sedated  Complications: No apparent anesthesia complications

## 2013-02-02 NOTE — Interval H&P Note (Signed)
History and Physical Interval Note:  02/02/2013 1:40 PM  [name]  has presented today for surgery, with the diagnosis of PMB/Fibroids.  Pt currently denies any bleeding for the last week. CPT [phone]  The various methods of treatment have been discussed with the patient and family. After consideration of risks, benefits and other options for treatment, the patient has consented to  Procedure(s): DILATATION AND CURETTAGE /HYSTEROSCOPY (N/A) as a surgical intervention .  The patient's history has been reviewed, patient examined, no change in status, stable for surgery.  I have reviewed the patient's chart and labs.  Questions were answered to the patient's satisfaction.     Dion Body, Jurney Overacker

## 2013-02-02 NOTE — Brief Op Note (Signed)
02/02/2013  3:28 PM  PATIENT:  [name]  62 y.o. female  PRE-OPERATIVE DIAGNOSIS:  PMB/Fibroid CPT [phone]  POST-OPERATIVE DIAGNOSIS:  Post menopausal bleeding, fibroid  PROCEDURE:  Procedure(s): DILATATION AND CURETTAGE /HYSTEROSCOPY (N/A)  SURGEON:  Surgeon(s) and Role:    * Geryl Rankins, MD - Primary  PHYSICIAN ASSISTANT: None  ASSISTANTS: Technician   ANESTHESIA:   general  EBL:  Total I/O In: 1300 [I.V.:1300] Out: 50 [Urine:50] 25 Hysteroscopy deficit of 505 (~200 cc under patient and on floor)  BLOOD ADMINISTERED:none  DRAINS: none   LOCAL MEDICATIONS USED:  2% LIDOCAINE 10 cc  SPECIMEN:  Source of Specimen:  Endometrial currettings  DISPOSITION OF SPECIMEN:  PATHOLOGY  COUNTS:  YES  TOURNIQUET:  * No tourniquets in log *  DICTATION: .Other Dictation: Dictation Number [id number]  PLAN OF CARE: Discharge to home after PACU  PATIENT DISPOSITION:  PACU - hemodynamically stable.   Delay start of Pharmacological VTE agent (>24hrs) due to surgical blood loss or risk of bleeding: not applicable

## 2013-02-02 NOTE — Transfer of Care (Signed)
Immediate Anesthesia Transfer of Care Note  Patient: [name]  Procedure(s) Performed: Procedure(s): DILATATION AND CURETTAGE /HYSTEROSCOPY (N/A)  Patient Location: PACU  Anesthesia Type:General  Level of Consciousness: awake, alert , oriented and patient cooperative  Airway & Oxygen Therapy: Patient Spontanous Breathing and Patient connected to nasal cannula oxygen  Post-op Assessment: Report given to PACU RN, Post -op Vital signs reviewed and stable and Patient moving all extremities  Post vital signs: Reviewed and stable  Complications: No apparent anesthesia complications

## 2013-02-02 NOTE — Anesthesia Preprocedure Evaluation (Signed)
Anesthesia Evaluation  Patient identified by MRN, date of birth, ID band Patient awake    Reviewed: Allergy & Precautions, H&P , NPO status , Patient's Chart, lab work & pertinent test results  Airway Mallampati: III TM Distance: >3 FB Neck ROM: Full    Dental no notable dental hx. (+) Teeth Intact   Pulmonary sleep apnea ,  breath sounds clear to auscultation  Pulmonary exam normal       Cardiovascular hypertension, Pt. on medications Rhythm:Regular Rate:Normal     Neuro/Psych  Headaches, CVA, No Residual Symptoms negative psych ROS   GI/Hepatic Neg liver ROS, GERD-  Medicated and Controlled,  Endo/Other  diabetes, Well Controlled, Type 2, Oral Hypoglycemic AgentsMorbid obesity  Renal/GU negative Renal ROS  negative genitourinary   Musculoskeletal  (+) Arthritis -, Osteoarthritis,    Abdominal (+) + obese,   Peds  Hematology   Anesthesia Other Findings   Reproductive/Obstetrics PMB Leiomyoma of Uterus                           Anesthesia Physical Anesthesia Plan  ASA: III  Anesthesia Plan: General   Post-op Pain Management:    Induction: Intravenous  Airway Management Planned: LMA  Additional Equipment:   Intra-op Plan:   Post-operative Plan: Extubation in OR  Informed Consent: I have reviewed the patients History and Physical, chart, labs and discussed the procedure including the risks, benefits and alternatives for the proposed anesthesia with the patient or authorized representative who has indicated his/her understanding and acceptance.   Dental advisory given  Plan Discussed with: Anesthesiologist, CRNA and Surgeon  Anesthesia Plan Comments:         Anesthesia Quick Evaluation

## 2013-02-03 ENCOUNTER — Encounter (HOSPITAL_COMMUNITY): Payer: Self-pay | Admitting: Obstetrics and Gynecology

## 2013-02-03 NOTE — Op Note (Signed)
[name], [name]                  ACCOUNT NO.:  [id number]  MEDICAL RECORD NO.:  [id number]  LOCATION:  [facility]                          [facility]ILITY:  [facility]  PHYSICIAN:  Pieter Partridge, MD   DATE OF BIRTH:  07/22/1950  DATE OF PROCEDURE:  02/02/2013 DATE OF DISCHARGE:  02/02/2013                              OPERATIVE REPORT   PREOPERATIVE DIAGNOSIS:  Persistent postmenopausal bleeding, fibroid.  POSTOPERATIVE DIAGNOSIS:  Persistent postmenopausal bleeding, fibroid.  PROCEDURE:  Hysteroscopy, D and C.  SURGEON:  Pieter Partridge, MD  ASSISTANT:  Technician.  ANESTHESIA:  Local 2% lidocaine 10 mL.  URINE OUTPUT:  50.  EBL:  25.  HYSTEROSCOPY DEFICIT:  550.  BLOOD ADMINISTERED:  None.  DRAINS:  None.  SPECIMEN:  Endometrial curettings to Pathology.  COUNTS:  Correct x2.  DISPOSITION:  To PACU hemodynamically stable.  COMPLICATIONS:  None.  FINDINGS:  Unable to advance hysteroscope through the endocervical canal, however, curettage revealed no obvious gross tissue, but there was a significant amount of bright red blood, also significant amount of vaginal atrophy causing also again bright red blood.  PROCEDURE IN DETAIL:  Ms. White was taken to the operating room, where she underwent general anesthesia without complication.  She was then prepped and draped in normal sterile fashion.  Hysteroscope was then wide balanced.  Graves speculum was inserted into the vagina.  An average-sized Graves speculum was a little too short because of the prolapse and also because of the patient's anatomy and the buttocks did not allow for adequate length into the vagina.  Eventually we ended up using a long Graves speculum.  A paracervical block was performed with 2% lidocaine.  Anterior lip of the cervix was initially injected and single-tooth tenaculum was placed at the anterior lip of the cervix.  The patient was dilated up to a 17, but as retracting on the cervix that displaced  the speculum and that needed to be held in place while using counter traction on the cervix.  The hysteroscope was then advanced.  Prior to that, there was some leakage noted from one of the cords, but that appeared to be going into the bag.  The hysteroscope was then advanced.  It looked like there was a false passage, but it did not appear to be a complete perforation. When the passage was noted above, it was difficult to get at the angle necessary with the patient's positioning and at that point in attempting to do so, the hysteroscopy deficit seemed to be climbing significantly or rapidly and the procedure was aborted.  There was no active bleeding noted at that time.  Attention was then turned to examine where the deficit could have been and it was that there was quite a bit of wart around the floor and under the patient.  Sharp curettage was then performed after the uterus was sounded to 10 cm.  Curettage was done easily and I felt strongly that I was in the intrauterine cavity and bright red blood again was noted.  That will clot easily, but there was no discrete tissue noted.  Single-tooth tenaculum was removed from the anterior lip of the cervix.  The vagina appeared to be very friable that was cleared out with moist laparotomy sponge and all instruments were removed from the vagina.  The patient tolerated the procedure well.  The patient was awakened and taken to recovery room in a stable condition.  She had Ancef 3 g IV prior to the procedure.  SCDs were on operating.  Time-out was performed prior to the procedure.     Pieter Partridge, MD     EBV/MEDQ  D:  02/02/2013  T:  02/03/2013  Job:  [id number]

## 2013-02-20 DIAGNOSIS — I10 Essential (primary) hypertension: Secondary | ICD-10-CM | POA: Insufficient documentation

## 2013-02-20 DIAGNOSIS — E785 Hyperlipidemia, unspecified: Secondary | ICD-10-CM | POA: Insufficient documentation

## 2013-05-21 ENCOUNTER — Other Ambulatory Visit: Payer: Self-pay

## 2013-11-23 ENCOUNTER — Other Ambulatory Visit: Payer: Self-pay

## 2013-11-23 DIAGNOSIS — Z1231 Encounter for screening mammogram for malignant neoplasm of breast: Secondary | ICD-10-CM

## 2013-12-01 ENCOUNTER — Ambulatory Visit
Admission: RE | Admit: 2013-12-01 | Discharge: 2013-12-01 | Disposition: A | Discharge status: Home / Self Care | Payer: BC Managed Care – PPO | Source: Ambulatory Visit

## 2013-12-01 DIAGNOSIS — Z1231 Encounter for screening mammogram for malignant neoplasm of breast: Secondary | ICD-10-CM

## 2015-01-10 ENCOUNTER — Other Ambulatory Visit: Payer: Self-pay

## 2015-01-10 DIAGNOSIS — Z1231 Encounter for screening mammogram for malignant neoplasm of breast: Secondary | ICD-10-CM

## 2015-01-12 ENCOUNTER — Ambulatory Visit
Admission: RE | Admit: 2015-01-12 | Discharge: 2015-01-12 | Disposition: A | Discharge status: Home / Self Care | Payer: BC Managed Care – PPO | Source: Ambulatory Visit

## 2015-01-12 DIAGNOSIS — Z1231 Encounter for screening mammogram for malignant neoplasm of breast: Secondary | ICD-10-CM

## 2015-03-07 ENCOUNTER — Other Ambulatory Visit: Payer: Self-pay | Admitting: Nurse Practitioner

## 2015-03-07 ENCOUNTER — Ambulatory Visit
Admission: RE | Admit: 2015-03-07 | Discharge: 2015-03-07 | Disposition: A | Discharge status: Home / Self Care | Payer: BC Managed Care – PPO | Source: Ambulatory Visit | Attending: Internal Medicine | Admitting: Internal Medicine

## 2015-03-07 DIAGNOSIS — R52 Pain, unspecified: Secondary | ICD-10-CM

## 2016-02-20 ENCOUNTER — Other Ambulatory Visit: Payer: Self-pay | Admitting: Internal Medicine

## 2016-02-20 DIAGNOSIS — Z1231 Encounter for screening mammogram for malignant neoplasm of breast: Secondary | ICD-10-CM

## 2016-03-05 ENCOUNTER — Ambulatory Visit
Admission: RE | Admit: 2016-03-05 | Discharge: 2016-03-05 | Disposition: A | Discharge status: Home / Self Care | Payer: Medicare Other | Source: Ambulatory Visit | Attending: Internal Medicine | Admitting: Internal Medicine

## 2016-03-05 DIAGNOSIS — Z1231 Encounter for screening mammogram for malignant neoplasm of breast: Secondary | ICD-10-CM

## 2016-06-22 ENCOUNTER — Other Ambulatory Visit: Payer: Self-pay | Admitting: Pharmacist

## 2016-06-22 NOTE — Patient Outreach (Signed)
Outreach call to [employer] regarding her request for follow up from the [facility] Medication Adherence Campaign. Left a HIPAA compliant message on the patient's voicemail.  Harlow Asa, PharmD, [facility]  [phone]

## 2016-08-13 ENCOUNTER — Other Ambulatory Visit: Payer: Self-pay | Admitting: Pharmacist

## 2016-08-13 NOTE — Patient Outreach (Signed)
Receive a call from Ms. Conroe regarding the cost of her blood pressure medication. Patient states that she saved my phone number from the voicemail that I left her last month. HIPAA identifiers verified and verbal consent received.  Patient reports that she has received a letter from her insurance company letting her know that her Tribenzor is being changed to a higher formulary tier. Patient would like assistance in determining what formulary alternatives might be available and affordable to her. Note that Tribenzor is now available as a generic. Review the formulary for the patient's plan on the [employer]. Note that the generic of Tribenzor, amlodipine/olmesartan/hydrochlorothiazide is a tier 1 option for the patient. Call patient's [place] and confirm that patient's prescription is written to allow for substitution. Also let Ms. Bartoletti know about the cost savings available to her by using [employer] order and provide her with the phone number. Ms. Marta expresses appreciation for my help.   Ms. Branson reports that she has no further medication questions/concerns at this time.    Harlow Asa, PharmD, [facility] [phone]

## 2017-04-10 ENCOUNTER — Other Ambulatory Visit: Payer: Self-pay | Admitting: Internal Medicine

## 2017-04-10 DIAGNOSIS — Z1231 Encounter for screening mammogram for malignant neoplasm of breast: Secondary | ICD-10-CM

## 2017-04-29 ENCOUNTER — Encounter: Payer: Self-pay | Admitting: Radiology

## 2017-04-29 ENCOUNTER — Ambulatory Visit
Admission: RE | Admit: 2017-04-29 | Discharge: 2017-04-29 | Disposition: A | Discharge status: Home / Self Care | Payer: Medicare Other | Source: Ambulatory Visit | Attending: Internal Medicine | Admitting: Internal Medicine

## 2017-04-29 DIAGNOSIS — Z1231 Encounter for screening mammogram for malignant neoplasm of breast: Secondary | ICD-10-CM

## 2017-12-23 LAB — BASIC METABOLIC PANEL
BUN: 10 (ref 4–21)
GLUCOSE: 115

## 2017-12-23 LAB — LIPID PANEL
Cholesterol: 168 (ref 0–200)
HDL: 47 (ref 35–70)
LDL Cholesterol: 86
Triglycerides: 177 — AB (ref 40–160)

## 2017-12-23 LAB — HEMOGLOBIN A1C: Hemoglobin A1C: 6

## 2017-12-26 ENCOUNTER — Encounter (HOSPITAL_COMMUNITY): Payer: Self-pay

## 2017-12-26 ENCOUNTER — Ambulatory Visit (HOSPITAL_COMMUNITY)
Admission: EM | Admit: 2017-12-26 | Discharge: 2017-12-26 | Disposition: A | Discharge status: Home / Self Care | Payer: Medicare Other | Attending: Family Medicine | Admitting: Family Medicine

## 2017-12-26 DIAGNOSIS — W260XXA Contact with knife, initial encounter: Secondary | ICD-10-CM | POA: Diagnosis not present

## 2017-12-26 DIAGNOSIS — Z23 Encounter for immunization: Secondary | ICD-10-CM | POA: Diagnosis not present

## 2017-12-26 DIAGNOSIS — S61211A Laceration without foreign body of left index finger without damage to nail, initial encounter: Secondary | ICD-10-CM

## 2017-12-26 MED ORDER — TETANUS-DIPHTH-ACELL PERTUSSIS 5-2.5-18.5 LF-MCG/0.5 IM SUSP
INTRAMUSCULAR | Status: AC
  Filled 2017-12-26: qty 0.5

## 2017-12-26 MED ORDER — TETANUS-DIPHTH-ACELL PERTUSSIS 5-2.5-18.5 LF-MCG/0.5 IM SUSP
0.5000 mL | Freq: Once | INTRAMUSCULAR | Status: AC
  Administered 2017-12-26: 0.5 mL via INTRAMUSCULAR

## 2017-12-26 MED ORDER — TETANUS-DIPHTHERIA TOXOIDS TD 5-2 LFU IM INJ: 0.5000 mL | INJECTION | Freq: Once | INTRAMUSCULAR | Status: DC

## 2017-12-26 NOTE — ED Triage Notes (Signed)
Pt presents with laceration to left first finger.

## 2017-12-26 NOTE — ED Provider Notes (Signed)
[facility]    CSN: [id number] Arrival date & time: 12/26/17  1959     History   Chief Complaint Chief Complaint  Patient presents with  . Laceration    HPI [name] is a 67 y.o. female.   Patient cut her left index finger with a knife while preparing supper.  This laceration is less than 2 hours old.  HPI  Past Medical History:  Diagnosis Date  . Diabetes mellitus without complication ([place])   . GERD (gastroesophageal reflux disease)   . Headache(784.0)   . High cholesterol   . Hypertension   . Sleep apnea    mild- no CPAP  . Stroke [facility]) 2005   mini- stroke due to HTN    There are no active problems to display for this patient.   Past Surgical History:  Procedure Laterality Date  . CHEST TUBE INSERTION  2004   lung punctured during shoulder surgery  . FOOT SURGERY     bilateral foot surgery  . HYSTEROSCOPY W/D&C N/A 02/02/2013   Procedure: DILATATION AND CURETTAGE /HYSTEROSCOPY;  Surgeon: Thurnell Lose, MD;  Location: [facility];  Service: Gynecology;  Laterality: N/A;  . KNEE ARTHROSCOPY    . SHOULDER ARTHROSCOPY    . TUBAL LIGATION      OB History   None      Home Medications    Prior to Admission medications   Medication Sig Start Date End Date Taking? Authorizing Provider  atorvastatin (LIPITOR) 20 MG tablet Take 20 mg by mouth daily.   Yes [provider]  Cholecalciferol (VITAMIN D) 2000 UNITS tablet Take 4,000 Units by mouth daily.   Yes [provider]  esomeprazole (NEXIUM) 20 MG capsule Take 20 mg by mouth 2 (two) times daily.   Yes [provider]  glucosamine-chondroitin 500-400 MG tablet Take 1 tablet by mouth 2 (two) times daily.   Yes [provider]  levocetirizine (XYZAL) 5 MG tablet Take 5 mg by mouth daily as needed for allergies.   Yes [provider]  Multiple Vitamin (MULTIVITAMIN WITH MINERALS) TABS Take 1 tablet by mouth daily.   Yes [provider]    olmesartan-hydrochlorothiazide (BENICAR HCT) 40-25 MG per tablet Take 1 tablet by mouth daily.   Yes [provider]  Saxagliptin-Metformin (KOMBIGLYZE XR) 11-998 MG TB24 Take 1 tablet by mouth daily.   Yes [provider]  traMADol (ULTRAM) 50 MG tablet Take 50 mg by mouth daily as needed for pain.   Yes [provider]  cyclobenzaprine (AMRIX) 15 MG 24 hr capsule Take 15 mg by mouth daily.    [provider]  diclofenac sodium (VOLTAREN) 1 % GEL Apply 2 g topically 2 (two) times daily as needed (for hand swelling).    [provider]  ibuprofen (ADVIL) 600 MG tablet Take 1 tablet (600 mg total) by mouth every 6 (six) hours as needed for pain. 02/02/13   Thurnell Lose, MD  Lorcaserin HCl (BELVIQ) 10 MG TABS Take 10 mg by mouth daily. Weight loss medication- stopped taking on July 15 for upcoming surgery.    [provider]    Family History No family history on file.  Social History Social History   Tobacco Use  . Smoking status: Never Smoker  Substance Use Topics  . Alcohol use: Yes    Comment: socially  . Drug use: No     Allergies   Codeine   Review of Systems Review of Systems  Constitutional: Negative for chills and fever.  HENT: Negative for ear pain and sore throat.   Eyes: Negative for pain and visual disturbance.  Respiratory: Negative for cough and shortness of breath.   Cardiovascular: Negative for chest pain and palpitations.  Gastrointestinal: Negative for abdominal pain and vomiting.  Genitourinary: Negative for dysuria and hematuria.  Musculoskeletal: Negative for arthralgias and back pain.  Skin: Positive for wound. Negative for color change and rash.  Neurological: Negative for seizures and syncope.  All other systems reviewed and are negative.    Physical Exam Triage Vital Signs ED Triage Vitals [12/26/17 2011]  Enc Vitals Group     BP 139/90     Pulse Rate 85     Resp 18     Temp 98 F  (36.7 C)     Temp src      SpO2 95 %     Weight      Height      Head Circumference      Peak Flow      Pain Score 4     Pain Loc      Pain Edu?      Excl. in [place]?    No data found.  Updated Vital Signs BP 139/90   Pulse 85   Temp 98 F (36.7 C)   Resp 18   LMP 01/23/2013   SpO2 95%   Visual Acuity Right Eye Distance:   Left Eye Distance:   Bilateral Distance:    Right Eye Near:   Left Eye Near:    Bilateral Near:     Physical Exam  Constitutional: She appears well-developed and well-nourished.  Skin:  Procedure note: Skin was prepped with Betadine.  Wound was anesthetized with 1% lidocaine without epinephrine.  2 interrupted sutures of 5-0 nylon were placed with good closure and hemostasis. Prior to suturing function was tested with normal flexion and extension.     UC Treatments / Results  Labs (all labs ordered are listed, but only abnormal results are displayed) Labs Reviewed - No data to display  EKG None  Radiology No results found.  Procedures Procedures (including critical care time)  Medications Ordered in UC Medications - No data to display  Initial Impression / Assessment and Plan / UC Course  I have reviewed the triage vital signs and the nursing notes.  Pertinent labs & imaging results that were available during my care of the patient were reviewed by me and considered in my medical decision making (see chart for details).     Finger laceration, sutured Final Clinical Impressions(s) / UC Diagnoses   Final diagnoses:  None   Discharge Instructions   None    ED Prescriptions    None     Controlled Substance Prescriptions  Controlled Substance Registry consulted? No   Wardell Honour, MD 12/26/17 2033

## 2018-01-03 ENCOUNTER — Ambulatory Visit (HOSPITAL_COMMUNITY)
Admission: EM | Admit: 2018-01-03 | Discharge: 2018-01-03 | Disposition: A | Discharge status: Home / Self Care | Payer: Medicare Other

## 2018-01-03 DIAGNOSIS — S61211D Laceration without foreign body of left index finger without damage to nail, subsequent encounter: Secondary | ICD-10-CM

## 2018-01-03 DIAGNOSIS — Z4802 Encounter for removal of sutures: Secondary | ICD-10-CM

## 2018-01-03 NOTE — ED Notes (Signed)
Bed: UC10 Expected date:  Expected time:  Means of arrival:  Comments:

## 2018-01-03 NOTE — ED Notes (Signed)
Pt here for two sutures removed from L pointer finger. WOund well healed. Pt has no further questions.

## 2018-02-27 ENCOUNTER — Encounter: Payer: Self-pay | Admitting: Neurology

## 2018-02-27 ENCOUNTER — Ambulatory Visit (INDEPENDENT_AMBULATORY_CARE_PROVIDER_SITE_OTHER): Payer: Medicare Other | Admitting: Neurology

## 2018-02-27 VITALS — BP 110/73 | HR 70 | Ht 66.0 in | Wt 243.0 lb

## 2018-02-27 DIAGNOSIS — R0683 Snoring: Secondary | ICD-10-CM | POA: Diagnosis not present

## 2018-02-27 DIAGNOSIS — G4720 Circadian rhythm sleep disorder, unspecified type: Secondary | ICD-10-CM

## 2018-02-27 DIAGNOSIS — R400 Somnolence: Secondary | ICD-10-CM

## 2018-02-27 DIAGNOSIS — I10 Essential (primary) hypertension: Secondary | ICD-10-CM | POA: Insufficient documentation

## 2018-02-27 DIAGNOSIS — E669 Obesity, unspecified: Secondary | ICD-10-CM

## 2018-02-27 NOTE — Progress Notes (Signed)
SLEEP MEDICINE CLINIC   Provider:  Larey Seat, M D  Primary Care Physician:  Glendale Chard, MD   Referring Provider: Glendale Chard, MD    Chief Complaint  Patient presents with  . New Patient (Initial Visit)    pt alone, rm 10. pt states that she has not been sleeping well through the last year. She gets 4- 4.5 hours each night of uninterupted sleep.  Pt. states she is told she snores in sleep, has for years. Had a sleep study several years ago at [facility] -  She states it did show mild apnea that wasn't enough for CPAP  treatment.    HPI:  [name] is a 67 y.o. female patient, seen here in a referral from Dr. Baird Cancer for a sleep evaluation.   I have the pleasure of meeting Mrs. both today, on 27 February 2018, she is a 67 year old female patient of African-American and Native American descent. She reports that her family and friends have told her that she snores but she has never woken herself from snoring or apnea.  She also denies being excessively daytime sleepy or fatigued but this is reflected in her Epworth Sleepiness Scale being endorsed at only 5 points fatigue severity at 27 points and she also does not have evidence of clinical depression according to her geriatric depression scale endorsed at 1 out of 15 possible points.  Dr. Baird Cancer stated that she has seen this patient for abdominal pain, diabetes type 2 and essential hypertension.  The abdominal pain is not associated with blood in the stool, there is no alteration between constipation and diarrhea.  She does have some gas but this affects her mostly in form of belching.  Diabetes has been compliantly treated with oral medication, she has a comorbidity of hypertension, she has not shown signs of nocturia, polydipsia, slow healing wounds.  She is not lightheaded when she wakes up in the morning.  She does not have chest pain.  Her hypertension has been exacerbated when she is anxious or under stress but there  has been no evidence of heart disease, tinnitus, nausea, they have never been strokes, epistaxis, claudication.  Chief complaint according to patient : " I would like to sleep through the night "   Sleep habits are as follows: dinner time is between 5-8, recently earlier in this range. She does housework, Medical sales representative afterwards, she lives alone. She watches TV, she never falls asleep in front of TV- she goes to bed every night 10 o clock , and having sometimes difficulties falling asleep, but she sleeps within 30 minutes, she doesn't read and has no TV in bed.  The bedroom,  in which she sleeps alone,  is cool, quiet and dark.  She stays asleep until 2.30- 2.45 and wakes up  She uses the bathroom, but it was not the cause for her arousal. It's  always at this time- and she  feels energized until about 5 AM , when she can go back to sleep until 7.30 when she goes to the gym. She is participating in a "boot camp".  Several years ago she did not have the problem of the long wakefullnes period. It entrained over years.  She feels menopause contributed by hot flushes and nocturia, and she feels she rests better after an exercise rich days. She sleeps on  1-2 pillows, she sleeps on her side, sometimes in supine.  She wakes up with a dry mouth.  Sleep medical history and family sleep history:  Sister has OSA, on CPAP.   Social history: retired - Mining engineer- o- lay for 24 years, retired from [employer] at [employer] for 14 years.   2 adult children , 4 grandchildren. Non smoker, social drinker- on [place]. Caffeine- 2 coffee in AM-, iced tea when eating out.     Review of Systems: Out of a complete 14 system review, the patient complains of only the following symptoms, and all other reviewed systems are negative.  snoring, interrupted sleep.   Epworth score 5 , Fatigue severity score 27 , depression score 1/ 15    Social History   Socioeconomic History  . Marital status: Single    Spouse name: Not on file    . Number of children: Not on file  . Years of education: Not on file  . Highest education level: Not on file  Occupational History  . Not on file  Social Needs  . Financial resource strain: Not on file  . Food insecurity:    Worry: Not on file    Inability: Not on file  . Transportation needs:    Medical: Not on file    Non-medical: Not on file  Tobacco Use  . Smoking status: Never Smoker  . Smokeless tobacco: Never Used  Substance and Sexual Activity  . Alcohol use: Yes    Comment: socially  . Drug use: No  . Sexual activity: Not on file  Lifestyle  . Physical activity:    Days per week: Not on file    Minutes per session: Not on file  . Stress: Not on file  Relationships  . Social connections:    Talks on phone: Not on file    Gets together: Not on file    Attends religious service: Not on file    Active member of club or organization: Not on file    Attends meetings of clubs or organizations: Not on file    Relationship status: Not on file  . Intimate partner violence:    Fear of current or ex partner: Not on file    Emotionally abused: Not on file    Physically abused: Not on file    Forced sexual activity: Not on file  Other Topics Concern  . Not on file  Social History Narrative  . Not on file    Family History  Problem Relation Age of Onset  . Cancer Mother   . Hypertension Mother   . Cancer Maternal Grandmother   . Hypertension Maternal Grandmother     Past Medical History:  Diagnosis Date  . Diabetes mellitus without complication ([place])   . GERD (gastroesophageal reflux disease)   . Headache(784.0)   . High cholesterol   . Hypertension   . Sleep apnea    mild- no CPAP  . Stroke [facility]) 2005   mini- stroke due to HTN    Past Surgical History:  Procedure Laterality Date  . CHEST TUBE INSERTION  2004   lung punctured during shoulder surgery  . FOOT SURGERY     bilateral foot surgery  . HYSTEROSCOPY W/D&C N/A 02/02/2013   Procedure:  DILATATION AND CURETTAGE /HYSTEROSCOPY;  Surgeon: Thurnell Lose, MD;  Location: [facility];  Service: Gynecology;  Laterality: N/A;  . KNEE ARTHROSCOPY    . SHOULDER ARTHROSCOPY    . TUBAL LIGATION      Current Outpatient Medications  Medication Sig Dispense Refill  . Cholecalciferol (VITAMIN D) 2000 UNITS tablet Take  2,000 Units by mouth daily.     Marland Kitchen levocetirizine (XYZAL) 5 MG tablet Take 5 mg by mouth daily as needed for allergies.    . metFORMIN (GLUCOPHAGE-XR) 750 MG 24 hr tablet     . olmesartan-hydrochlorothiazide (BENICAR HCT) 40-25 MG per tablet Take 1 tablet by mouth daily.    Marland Kitchen OZEMPIC 0.25 or 0.5 MG/DOSE SOPN     . pantoprazole (PROTONIX) 40 MG tablet TK 1 T PO QD  0  . Triamcinolone Acetonide (NASACORT AQ NA) Place into the nose.     No current facility-administered medications for this visit.     Allergies as of 02/27/2018 - Review Complete 02/27/2018  Allergen Reaction Noted  . Codeine Nausea And Vomiting 01/28/2013    Vitals: BP 110/73   Pulse 70   Ht 5\' 6"  (1.676 m)   Wt 243 lb (110.2 kg)   LMP 01/23/2013   BMI 39.22 kg/m  Last Weight:  Wt Readings from Last 1 Encounters:  02/27/18 243 lb (110.2 kg)   CHE:NIDP mass index is 39.22 kg/m.     Last Height:   Ht Readings from Last 1 Encounters:  02/27/18 5\' 6"  (1.676 m)    Physical exam:  General: The patient is awake, alert and appears not in acute distress. The patient is well groomed. Head: Normocephalic, atraumatic. Neck is supple. Mallampati 3,  neck circumference:15" . Nasal airflow patent , Cardiovascular:  Regular rate and rhythm , without  murmurs or carotid bruit, and without distended neck veins. Respiratory: Lungs are clear to auscultation. Skin:  Only mild ankle edema, or rash Trunk: BMI is 39.22 . The patient's posture is erect   Neurologic exam : The patient is awake and alert, oriented to place and time.   Memory subjective described as intact.  Attention span & concentration ability  appears normal.  Speech is fluent,  without dysarthria, dysphonia or aphasia.  Mood and affect are appropriate.  Cranial nerves: inatact smell and taste. Pupils are equal and briskly reactive to light. Funduscopic exam deferred . Extraocular movements  in vertical and horizontal planes intact and without nystagmus. Visual fields deferred.Hearing to finger rub intact. Facial sensation intact to fine touch.  Facial motor strength is symmetric and tongue and uvula move midline. Shoulder shrug was symmetrical.   Motor exam:  Normal tone, muscle bulk and symmetric strength in all extremities.  Sensory:  Fine touch, pinprick and vibration were tested in all extremities. Proprioception in the upper extremities was normal.  Coordination: Rapid alternating movements in the fingers/hands was normal. Finger-to-nose maneuver  normal without evidence of ataxia, dysmetria or tremor.  Gait and station: Patient walks without assistive device and is able unassisted to climb up to the exam table. Strength within normal limits.  Stance is stable and normal. Deep tendon reflexes: in the  upper and lower extremities are symmetric and intact.     Assessment:  After physical and neurologic examination, review of laboratory studies,  Personal review of imaging studies, reports of other /same  Imaging studies, results of polysomnography and / or neurophysiology testing and pre-existing records as far as provided in visit., my assessment is   1)  Mrs. Lariviere is snoring as witnessed by family members , nobody has told her she stops breathing. She has milder risk factors for OSA, BMI is elevated, but Mallampati and neck size are normal, she doesn't present with retrognathia or poor dental status- all biological teeth.   2) she has one early morning arousal- spontaneously-  like an inner clock waking her. This is not unusual, has been a pattern of healthy sleep - but she likes to addres this.  I will order a [place] directed  In lab study.   The patient was advised of the nature of the diagnosed disorder , the treatment options and the  risks for general health and wellness arising from not treating the condition.   I spent more than 45 minutes of face to face time with the patient.  Greater than 50% of time was spent in counseling and coordination of care. We have discussed the diagnosis and differential and I answered the patient's questions.    Plan:  Treatment plan and additional workup : SPLIT at AHI 20 if OSA found.  The early morning arousals in a patient with out symptoms or signs of depression harder to address.  In a depressed patient early morning arousals almost always present.  Sometimes it is a sign of an entrained rhythm but has gone over the years and may have set on as a sleep disorder during menopause.  I would like for her to try 1 or 2 mg of melatonin at bedtime as this medication or nutritional supplement of his possible delay of the arousal.  It also helps to increase serotonin levels which help to maintain sleep stable overnight.  This could be achieved by a L tryptophane nutritional supplement, but it is also present in [place] meat .    Larey Seat, MD 9/56/3875, 6:43 PM  Certified in Neurology by ABPN Certified in [place] by [facility] [address] [place], [place] [address]

## 2018-02-27 NOTE — Patient Instructions (Signed)
Melatonin oral solid dosage forms What is this medicine? MELATONIN (mel uh TOH nin) is a dietary supplement. It is mostly promoted to help maintain normal sleep patterns. The FDA has not approved this supplement for any medical use. This supplement may be used for other purposes; ask your health care provider or pharmacist if you have questions. This medicine may be used for other purposes; ask your health care provider or pharmacist if you have questions. COMMON BRAND NAME(S): Melatonex What should I tell my health care provider before I take this medicine? They need to know if you have any of these conditions: -cancer -depression or mental illness -diabetes -hormone problems -if you often drink alcohol -immune system problems -liver disease -lung or breathing disease, like asthma -organ transplant -seizure disorder -an unusual or allergic reaction to melatonin, other medicines, foods, dyes, or preservatives -pregnant or trying to get pregnant -breast-feeding How should I use this medicine? Take this supplement by mouth with a glass of water. Do not take with food. This supplement is usually taken 1 or 2 hours before bedtime. After taking this supplement, limit your activities to those needed to prepare for bed. Some products may be chewed or dissolved in the mouth before swallowing. Some tablets or capsules must be swallowed whole; do not cut, crush or chew. Follow the directions on the package labeling, or take as directed by your health care professional. Do not take this supplement more often than directed. Talk to your pediatrician regarding the use of this supplement in children. Special care may be needed. This supplement is not recommended for use in children without a prescription. Overdosage: If you think you have taken too much of this medicine contact a poison control center or emergency room at once. NOTE: This medicine is only for you. Do not share this medicine with  others. What if I miss a dose? If you miss taking your dose at the usual time, skip that dose. If it is almost time for your next dose, take only that dose. Do not take double or extra doses. What may interact with this medicine? Do not take this medicine with any of the following medications: -fluvoxamine -ramelteon -tasimelteon This medicine may also interact with the following medications: -alcohol -caffeine -carbamazepine -certain antibiotics like ciprofloxacin -certain medicines for depression, anxiety, or psychotic disturbances -cimetidine -female hormones, like estrogens and birth control pills, patches, rings, or injections -methoxsalen -nifedipine -other medications for sleep -other herbal or dietary supplements -phenobarbital -rifampin -smoking tobacco -tamoxifen -warfarin This list may not describe all possible interactions. Give your health care provider a list of all the medicines, herbs, non-prescription drugs, or dietary supplements you use. Also tell them if you smoke, drink alcohol, or use illegal drugs. Some items may interact with your medicine. What should I watch for while using this medicine? See your doctor if your symptoms do not get better or if they get worse. Do not take this supplement for more than 2 weeks unless your doctor tells you to. You may get drowsy or dizzy. Do not drive, use machinery, or do anything that needs mental alertness until you know how this medicine affects you. Do not stand or sit up quickly, especially if you are an older patient. This reduces the risk of dizzy or fainting spells. Alcohol may interfere with the effect of this medicine. Avoid alcoholic drinks. Talk to your doctor before you use this supplement if you are currently being treated for an emotional, mental, or sleep problem. This medicine   may interfere with your treatment. Herbal or dietary supplements are not regulated like medicines. Rigid quality control standards are  not required for dietary supplements. The purity and strength of these products can vary. The safety and effect of this dietary supplement for a certain disease or illness is not well known. This product is not intended to diagnose, treat, cure or prevent any disease. The Food and Drug Administration suggests the following to help consumers protect themselves: -Always read product labels and follow directions. -Natural does not mean a product is safe for humans to take. -Look for products that include USP after the ingredient name. This means that the manufacturer followed the standards of the US Pharmacopoeia. -Supplements made or sold by a nationally known food or drug company are more likely to be made under tight controls. You can write to the company for more information about how the product was made. What side effects may I notice from receiving this medicine? Side effects that you should report to your doctor or health care professional as soon as possible: -allergic reactions like skin rash, itching or hives, swelling of the face, lips, or tongue -breathing problems -confusion -depressed mood, irritable, or other changes in moods or behaviors -feeling faint or lightheaded, falls -increased blood pressure -irregular or missed menstrual periods -signs and symptoms of liver injury like dark yellow or brown urine; general ill feeling or flu-like symptoms; light-colored stools; loss of appetite; nausea; right upper belly pain; unusually weak or tired; yellowing of the eyes or skin -trouble staying awake or alert during the day -unusual activities while you are still asleep like driving, eating, making phone calls -unusual bleeding or bruising Side effects that usually do not require medical attention (report to your doctor or health care professional if they continue or are bothersome): -dizziness -drowsiness -headache -hot flashes -nausea -tiredness -unusual dreams or  nightmares -upset stomach This list may not describe all possible side effects. Call your doctor for medical advice about side effects. You may report side effects to FDA at 1-800-FDA-1088. Where should I keep my medicine? Keep out of the reach of children. Store at room temperature or as directed on the package label. Protect from moisture. Throw away any unused supplement after the expiration date. NOTE: This sheet is a summary. It may not cover all possible information. If you have questions about this medicine, talk to your doctor, pharmacist, or health care provider.  2018 Elsevier/Gold Standard (2016-03-26 14:38:22)  

## 2018-03-14 ENCOUNTER — Ambulatory Visit (INDEPENDENT_AMBULATORY_CARE_PROVIDER_SITE_OTHER): Payer: Medicare Other | Admitting: Neurology

## 2018-03-14 DIAGNOSIS — G4733 Obstructive sleep apnea (adult) (pediatric): Secondary | ICD-10-CM | POA: Diagnosis not present

## 2018-03-14 DIAGNOSIS — R0683 Snoring: Secondary | ICD-10-CM

## 2018-03-14 DIAGNOSIS — R400 Somnolence: Secondary | ICD-10-CM

## 2018-03-14 DIAGNOSIS — E669 Obesity, unspecified: Secondary | ICD-10-CM

## 2018-03-14 DIAGNOSIS — G4720 Circadian rhythm sleep disorder, unspecified type: Secondary | ICD-10-CM

## 2018-03-14 DIAGNOSIS — I10 Essential (primary) hypertension: Secondary | ICD-10-CM

## 2018-03-19 NOTE — Procedures (Signed)
PATIENT'S NAME:  [name], [name] DOB:      1950/10/03      MR#:    [id number]     DATE OF RECORDING: 03/14/2018 REFERRING M.D.:  Glendale Chard MD Study Performed:   Baseline Polysomnogram HISTORY:  Mrs. Virgo is a 67 year old female patient of African-American and Native American descent. She reports that her family and friends have told her that she snores but she has never woken herself from snoring or apnea.  She also denies being excessively daytime sleepy or fatigued - this is reflected in her Epworth Sleepiness Scale being endorsed at only 5 points fatigue severity at 27 points ,and geriatric depression scale endorsed at 1 out of 15 possible points. The patient's weight 243 pounds with a height of 66 (inches), resulting in a BMI of 39.0 kg/m2. The patient's neck circumference measured 15 inches.  CURRENT MEDICATIONS: Vitamin D, Xyzal, Glucophage, Benicar-HCT, Ozempic, Protonix, Nasacort AQ,   PROCEDURE:  This is a multichannel digital polysomnogram utilizing the Somnostar 11.2 system.  Electrodes and sensors were applied and monitored per AASM Specifications.   EEG, EOG, Chin and Limb EMG, were sampled at 200 Hz.  ECG, Snore and Nasal Pressure, Thermal Airflow, Respiratory Effort, CPAP Flow and Pressure, Oximetry was sampled at 50 Hz. Digital video and audio were recorded.      BASELINE STUDY: Lights Out was at 22:12 and Lights On at 05:10.  Total recording time (TRT) was 418 minutes, with a total sleep time (TST) of 198.5 minutes.   The patient's sleep latency was 60 minutes.  REM latency was 350 minutes.  The sleep efficiency was poor at 47.5 %.     SLEEP ARCHITECTURE: WASO (Wake after sleep onset) was 192 minutes.  There were 17 minutes in Stage N1, 143 minutes Stage N2, 26.5 minutes Stage N3 and 12 minutes in Stage REM.  The percentage of Stage N1 was 8.6%, Stage N2 was 72.%, Stage N3 was 13.4% and Stage R (REM sleep) was 6.0%.   RESPIRATORY ANALYSIS:  There were a total of 24 respiratory  events:  8 obstructive apneas, 0 central apneas and 16 hypopneas with 0 respiratory event related arousals (RERAs).     The total APNEA/HYPOPNEA INDEX (AHI) was 7.3 /hour and the total RESPIRATORY DISTURBANCE INDEX was 7.3 /hour.  8 events occurred in REM sleep and 28 events in NREM. The REM AHI was 40.0 /hour, versus a non-REM AHI of 5.1. The patient spent 13 minutes of total sleep time in the supine position and 186 minutes in non-supine. The supine AHI was 23.1 versus a non-supine AHI of 6.2.  OXYGEN SATURATION & C02:  The Wake baseline 02 saturation was 96%, with the lowest being 80%. Time spent below 89% saturation equaled 10 minutes.   AROUSALS/ PERIODIC LIMB MOVEMENTS:  The patient had a total of 82 Periodic Limb Movements.  The Periodic Limb Movement (PLM) index was 24.8 and the PLM Arousal index was 3.0/hour. The arousals were noted as: 46 were spontaneous, 10 were associated with PLMs, and 8 were associated with respiratory events.    Audio and video analysis did not show any abnormal or unusual movements, behaviors, phonations or vocalizations.  The patient took one bathroom break and had trouble to go back to sleep. Snoring was noted. EKG was in keeping with normal sinus rhythm (NSR) and some PVCs. Post-study, the patient indicated that sleep was the same as usual.    IMPRESSION:  1. Very mild Obstructive Sleep Apnea (OSA) 2. Mild- Moderate  Periodic Limb Movement Disorder (PLMD), only within the last 2 hours of sleep. 3. Mild Primary Snoring 4. Frequent arousal from sleep, unrelated to apnea or desaturation. The longest period of 2 hours followed nocturia.  5. Normal EKG with some PVCs.    RECOMMENDATIONS:  1. Advise full-night, attended, CPAP titration study to optimize therapy- and to advise patient to avoid supine sleep, pursue weight loss.  2. Further work up for PLMs- looking at overlapping RLS symptoms, iron deficiency, spinal stenosis or Neuropathy.    I certify that I  have reviewed the entire raw data recording prior to the issuance of this report in accordance with the Standards of Accreditation of the American Academy of Sleep Medicine (AASM)    Larey Seat, MD    03-19-2018  Diplomat, American Board of Psychiatry and Neurology  Diplomat, American Board of [place] Director, [place] [facility] [employer]

## 2018-03-19 NOTE — Addendum Note (Signed)
Addended by: Larey Seat on: 03/19/2018 09:06 AM   Modules accepted: Orders

## 2018-03-20 ENCOUNTER — Telehealth: Payer: Self-pay | Admitting: Neurology

## 2018-03-20 NOTE — Telephone Encounter (Signed)
I called pt. I advised pt that Dr. Brett Fairy reviewed their sleep study results and found that had mild sleep apnea and recommends that pt be treated with a cpap. Dr. Brett Fairy recommends that pt return for a repeat sleep study in order to properly titrate the cpap and ensure a good mask fit. Pt is agreeable to returning for a titration study. I advised pt that our sleep lab will file with pt's insurance and call pt to schedule the sleep study when we hear back from the pt's insurance regarding coverage of this sleep study. Pt verbalized understanding of results. Pt had no questions at this time but was encouraged to call back if questions arise.

## 2018-03-20 NOTE — Telephone Encounter (Signed)
-----   Message from Larey Seat, MD sent at 03/19/2018  9:06 AM EDT ----- IMPRESSION:  1. Very mild Obstructive Sleep Apnea (OSA) 2. Mild- Moderate Periodic Limb Movement Disorder (PLMD), only  within the last 2 hours of sleep. 3. Mild Primary Snoring 4. Frequent arousal from sleep, unrelated to apnea or  desaturation. The longest period of 2 hours followed nocturia.  5. Normal EKG with some PVCs.    RECOMMENDATIONS:  1. Advise full-night, attended, CPAP titration study to optimize  therapy- and to advise patient to avoid supine sleep, pursue  weight loss.  2. Further work up for PLMs- looking at overlapping RLS symptoms,  iron deficiency, spinal stenosis or Neuropathy.

## 2018-03-20 NOTE — Telephone Encounter (Signed)
Called patient to discuss sleep study results. No answer at this time. LVM for the patient to call back.   

## 2018-04-06 ENCOUNTER — Ambulatory Visit (INDEPENDENT_AMBULATORY_CARE_PROVIDER_SITE_OTHER): Payer: Medicare Other | Admitting: Neurology

## 2018-04-06 DIAGNOSIS — E669 Obesity, unspecified: Secondary | ICD-10-CM

## 2018-04-06 DIAGNOSIS — I10 Essential (primary) hypertension: Secondary | ICD-10-CM

## 2018-04-06 DIAGNOSIS — G4733 Obstructive sleep apnea (adult) (pediatric): Secondary | ICD-10-CM

## 2018-04-06 DIAGNOSIS — R0683 Snoring: Secondary | ICD-10-CM

## 2018-04-06 DIAGNOSIS — G4761 Periodic limb movement disorder: Secondary | ICD-10-CM

## 2018-04-06 DIAGNOSIS — G4720 Circadian rhythm sleep disorder, unspecified type: Secondary | ICD-10-CM

## 2018-04-09 ENCOUNTER — Other Ambulatory Visit: Payer: Self-pay | Admitting: Internal Medicine

## 2018-04-09 DIAGNOSIS — Z1231 Encounter for screening mammogram for malignant neoplasm of breast: Secondary | ICD-10-CM

## 2018-04-11 NOTE — Procedures (Signed)
PATIENT'S NAME:  [name], [name] DOB:      1950/11/05      MR#:    [id number]     DATE OF RECORDING: 04/06/2018 REFERRING M.D.:  Glendale Chard, M.D. Study Performed:   Titration to Positive Airway Pressure  HISTORY:  [name] is a 67 year old female patient of African-American and Native American descent. She returned for a CPAP titration after an attended PSG on 03/14/18 showed a total APNEA/HYPOPNEA INDEX (AHI) at 7.3 /hour and the SpO2 Nadir at 80%. Time spent below 89% saturation equaled 10 minutes. The Periodic Limb Movement (PLM) index was 24.8 and the PLM Arousal index was 3.0/hour. The patient's weight 243 pounds with a height of 66 (inches), resulting in a BMI of 39.0 kg/m2. The patient's neck circumference measured 15 inches. She reports that her family and friends have told her that she snores but she has never been woken by snoring or apnea.  She also denies being excessively daytime sleepy or fatigued - this is reflected in her Epworth Sleepiness Scale being endorsed at only 5 points , the fatigue severity at 27 points ,and geriatric depression scale endorsed at 1 out of 15 possible points. The patient's weight 243 pounds with a height of 66 (inches), resulting in a BMI of 39.0 kg/m2. The patient's neck circumference measured 15 inches  CURRENT MEDICATIONS: Vitamin D, Xyzal, Glucophage, Benicar-HCT, Ozempic, Protonix, Nasacort AQ,   PROCEDURE:  This is a multichannel digital polysomnogram utilizing the SomnoStar 11.2 system.  Electrodes and sensors were applied and monitored per AASM Specifications.   EEG, EOG, Chin and Limb EMG, were sampled at 200 Hz.  ECG, Snore and Nasal Pressure, Thermal Airflow, Respiratory Effort, CPAP Flow and Pressure, Oximetry was sampled at 50 Hz. Digital video and audio were recorded.      CPAP was initiated at 5 cmH20 with heated humidity per AASM split night standards and pressure was advanced to 7/cmH20 because of hypopneas, apneas and desaturations.  The  highest sleep efficiency was seen under 6 cm water (98%). At a PAP pressure of 7 cmH20, there was a reduction of the AHI to 0.0/h with improvement of sleep apnea but only 52% sleep efficiency. The patient was fitted with a Respironics Dreamwear nasal (Med) mask. Lights Out was at 21:22 and Lights On at 05:00. Total recording time (TRT) was 458.5 minutes, with a total sleep time (TST) of 284 minutes. The patient's sleep latency was 73.5 minutes. REM latency was 70.5 minutes.  The sleep efficiency was 61.9 %.    SLEEP ARCHITECTURE: WASO (Wake after sleep onset) was 109.5 minutes.  There were 10 minutes in Stage N1, 230 minutes in Stage N2, 0 minutes in Stage N3, and 44 minutes in Stage REM.  The percentage of Stage N1 was 3.5%, Stage N2 was 81.0%, Stage N3 was 0% and Stage R (REM sleep) was 15.5%.   RESPIRATORY ANALYSIS:  There was a total of 2 respiratory events: 0 apneas and 2 hypopneas with 0 respiratory event related arousals (RERAs). The total APNEA/HYPOPNEA INDEX  (AHI) was 0.4 /hour and the total RESPIRATORY DISTURBANCE INDEX was 0.4 /hour  2 events occurred in REM sleep and 0 events in NREM. The REM AHI was 2.7 /hour versus a non-REM AHI of 0.0 /hour.  The patient spent 67 minutes of total sleep time in the supine position and 217 minutes in non-supine. The supine AHI was 1.8, versus a non-supine AHI of 0.0.  OXYGEN SATURATION & C02:  The baseline 02 saturation was  94%, with the lowest being 87%. Time spent below 89% saturation equaled 7 minutes.  PERIODIC LIMB MOVEMENTS:  The patient had a total of 0 Periodic Limb Movements. The arousals were noted as: 19 were spontaneous, 0 were associated with PLMs, and only 2 were associated with respiratory events. Audio and video analysis did not show any abnormal or unusual movements, behaviors, phonations or vocalizations. PLMs seen frequently in the baseline PSG were resolving as apnea was treated.   The patient took only one bathroom break.  Snoring was  alleviated at only 6 cm water pressure. The patient slept the longest time at 6 cm water pressure which did not allow for a full alleviation of apnea, but a remarkable sleep efficiency of 98.5% EKG was in keeping with normal sinus rhythm. Isolated PVCs.   DIAGNOSIS: Mild Obstructive Sleep Apnea (OSA) was alleviated under low pressure CPAP, as was snoring and airway resistance. Supine sleep position was still associated with some airway resistance. The PLMs noted in baseline PSG were no longer present. EKG was now in NSR with only 2 PVCs in NREM sleep.  * PS: Post-study, the patient indicated that sleep was better than usual.   PLANS/RECOMMENDATIONS: 1. An autotitration CPAP device will be ordered for this patient, a DreamWear nasal cradle in medium size and heated humidity are ordered. The pressure settings will be from 5 through 10 cm water pressure with 2 cm EPR. 2. Re-educate patient in sleep hygiene measures, such as set bedtime, rise time and creating an environment that is conducive to sleep. Avoid supine sleep.    3. Reduction in body weight is needed to treat the main risk factor for OSA in this patient. 4. CPAP therapy compliance is defined as 4 hours or more of nightly use (if you nap, use CPAP then, too).  5. All OSA patients should avoid sedatives, hypnotics, and alcoholic beverage consumption before Bedtime- as applicable.  A follow up appointment will be scheduled in the Sleep Clinic at [facility].   Please call [phone] with any questions.     I certify that I have reviewed the entire raw data recording prior to the issuance of this report in accordance with the Standards of Accreditation of the American Academy of Sleep Medicine (AASM)  Larey Seat, M.D.  04-11-2018  Diplomat, American Board of Psychiatry and Neurology  Diplomat, [place] of [facility] Medical Director, [place] [facility] at [facility]

## 2018-04-11 NOTE — Addendum Note (Signed)
Addended by: Larey Seat on: 04/11/2018 08:57 AM   Modules accepted: Orders

## 2018-04-14 ENCOUNTER — Telehealth: Payer: Self-pay | Admitting: Internal Medicine

## 2018-04-14 NOTE — Telephone Encounter (Signed)
I left a message letting the patient know that her yearly Medicare questionnaire has been scheduled on 04/23/18 with [place]. VDM (DD)

## 2018-04-15 ENCOUNTER — Telehealth: Payer: Self-pay

## 2018-04-15 NOTE — Telephone Encounter (Signed)
-----   Message from Larey Seat, MD sent at 04/11/2018  8:57 AM EDT ----- PATIENT'S NAME:  [name], [name] DOB:      [birth year]      MR#:    [id number]     DATE OF RECORDING: 04/06/2018 REFERRING M.D.:  Glendale Chard, M.D. Study Performed:   Titration to Positive Airway Pressure   DIAGNOSIS: Mild Obstructive Sleep Apnea (OSA) was alleviated under low pressure CPAP, as was snoring and airway resistance. Supine sleep position was still associated with some airway resistance. The PLMs noted in baseline PSG were no longer present. EKG was now in NSR with only 2 PVCs in NREM sleep.  # PS: Post-study, the patient indicated that sleep was better than usual.   PLANS/RECOMMENDATIONS: 1. An autotitration CPAP device will be ordered for this patient, a DreamWear nasal cradle in medium size and heated humidity are ordered. The pressure settings will be from 5 through 10 cm water pressure with 2 cm EPR. 2. Re-educate patient in sleep hygiene measures, such as set bedtime, rise time and creating an environment that is conducive to sleep. Avoid supine sleep.    3. Reduction in body weight is needed to treat the main risk factor for OSA in this patient. 4. CPAP therapy compliance is defined as 4 hours or more of nightly use (if you nap, use CPAP then, too).  5. All OSA patients should avoid sedatives, hypnotics, and alcoholic beverage consumption before Bedtime- as applicable.  A follow up appointment will be scheduled in the Sleep Clinic at [facility].   Please call [phone] with any questions.     I certify that I have reviewed the entire raw data recording prior to the issuance of this report in accordance with the Standards of Accreditation of the American Academy of Sleep Medicine (AASM)  Larey Seat, M.D.  04-11-2018  Diplomat, American Board of Psychiatry and Neurology  Diplomat, [place] of Sleep Medicine Medical Director, [place] [facility] at [facility]

## 2018-04-15 NOTE — Telephone Encounter (Signed)
I called pt. I advised pt that Dr. Brett Fairy reviewed their sleep study results and found that pt did well with the cpap during her sleep study. Dr. Brett Fairy recommends that pt start a cpap. I reviewed PAP compliance expectations with the pt. Pt is agreeable to starting an auto-PAP. I advised pt that an order will be sent to a DME, [facility], and [facility] will call the pt within about one week after they file with the pt's insurance. [facility] will show the pt how to use the machine, fit for masks, and troubleshoot the auto-PAP if needed. A follow up appt was made for insurance purposes with Dr. Brett Fairy on 07/30/18 at 2:30pm. Pt verbalized understanding to arrive 15 minutes early and bring their auto-PAP. A letter with all of this information in it will be sent to the pt's mychart account as a reminder. I verified with the pt that the address we have on file is correct. I advised pt to avoid sedative-hypnotics which may worsen sleep apnea, alcohol and tobacco.  I reminded pt to avoid supine sleep and work on reduction in body weight. Pt verbalized understanding of results. Pt had no questions at this time but was encouraged to call back if questions arise.

## 2018-04-23 ENCOUNTER — Ambulatory Visit: Payer: Medicare Other

## 2018-04-23 VITALS — BP 132/76 | HR 70 | Temp 97.7°F | Ht 65.5 in | Wt 234.8 lb

## 2018-04-23 DIAGNOSIS — Z0000 Encounter for general adult medical examination without abnormal findings: Secondary | ICD-10-CM | POA: Diagnosis not present

## 2018-04-23 DIAGNOSIS — Z23 Encounter for immunization: Secondary | ICD-10-CM

## 2018-04-23 DIAGNOSIS — E1165 Type 2 diabetes mellitus with hyperglycemia: Secondary | ICD-10-CM | POA: Diagnosis not present

## 2018-04-23 LAB — POCT UA - MICROALBUMIN
Albumin/Creatinine Ratio, Urine, POC: <30
Creatinine, POC: 300 mg/dL
Microalbumin Ur, POC: 10 mg/L

## 2018-04-23 NOTE — Patient Instructions (Signed)
[name] , Thank you for taking time to come for your Medicare Wellness Visit. I appreciate your ongoing commitment to your health goals. Please review the following plan we discussed and let me know if I can assist you in the future.   Screening recommendations/referrals: Colonoscopy: up to date  Mammogram: ordered Bone Density: up to date Recommended yearly ophthalmology/optometry visit for glaucoma screening and checkup Recommended yearly dental visit for hygiene and checkup  Vaccinations: Influenza vaccine: administered today Pneumococcal vaccine: administered today Tdap vaccine: 12/26/2017 Shingles vaccine: decline    Advanced directives: Advance directive discussed with you today. I have provided a copy for you to complete at home and have notarized. Once this is complete please bring a copy in to our office so we can scan it into your chart.   Conditions/risks identified: Obesity: Patient is working out 3 times a week and is decreasing calorie intake while increasing water intake.  Next appointment: 06/02/2018 at 2:15p   Preventive Care 65 Years and Older, Female Preventive care refers to lifestyle choices and visits with your health care provider that can promote health and wellness. What does preventive care include?  A yearly physical exam. This is also called an annual well check.  Dental exams once or twice a year.  Routine eye exams. Ask your health care provider how often you should have your eyes checked.  Personal lifestyle choices, including:  Daily care of your teeth and gums.  Regular physical activity.  Eating a healthy diet.  Avoiding tobacco and drug use.  Limiting alcohol use.  Practicing safe sex.  Taking low-dose aspirin every day.  Taking vitamin and mineral supplements as recommended by your health care provider. What happens during an annual well check? The services and screenings done by your health care provider during your annual well  check will depend on your age, overall health, lifestyle risk factors, and family history of disease. Counseling  Your health care provider may ask you questions about your:  Alcohol use.  Tobacco use.  Drug use.  Emotional well-being.  Home and relationship well-being.  Sexual activity.  Eating habits.  History of falls.  Memory and ability to understand (cognition).  Work and work Statistician.  Reproductive health. Screening  You may have the following tests or measurements:  Height, weight, and BMI.  Blood pressure.  Lipid and cholesterol levels. These may be checked every 5 years, or more frequently if you are over 38 years old.  Skin check.  Lung cancer screening. You may have this screening every year starting at age 103 if you have a 30-pack-year history of smoking and currently smoke or have quit within the past 15 years.  Fecal occult blood test (FOBT) of the stool. You may have this test every year starting at age 8.  Flexible sigmoidoscopy or colonoscopy. You may have a sigmoidoscopy every 5 years or a colonoscopy every 10 years starting at age 12.  Hepatitis C blood test.  Hepatitis B blood test.  Sexually transmitted disease (STD) testing.  Diabetes screening. This is done by checking your blood sugar (glucose) after you have not eaten for a while (fasting). You may have this done every 1-3 years.  Bone density scan. This is done to screen for osteoporosis. You may have this done starting at age 82.  Mammogram. This may be done every 1-2 years. Talk to your health care provider about how often you should have regular mammograms. Talk with your health care provider about your test  results, treatment options, and if necessary, the need for more tests. Vaccines  Your health care provider may recommend certain vaccines, such as:  Influenza vaccine. This is recommended every year.  Tetanus, diphtheria, and acellular pertussis (Tdap, Td) vaccine. You  may need a Td booster every 10 years.  Zoster vaccine. You may need this after age 75.  Pneumococcal 13-valent conjugate (PCV13) vaccine. One dose is recommended after age 39.  Pneumococcal polysaccharide (PPSV23) vaccine. One dose is recommended after age 51. Talk to your health care provider about which screenings and vaccines you need and how often you need them. This information is not intended to replace advice given to you by your health care provider. Make sure you discuss any questions you have with your health care provider. Document Released: 07/29/2015 Document Revised: 03/21/2016 Document Reviewed: 05/03/2015 Elsevier Interactive Patient Education  2017 [place] Prevention in the Home Falls can cause injuries. They can happen to people of all ages. There are many things you can do to make your home safe and to help prevent falls. What can I do on the outside of my home?  Regularly fix the edges of walkways and driveways and fix any cracks.  Remove anything that might make you trip as you walk through a door, such as a raised step or threshold.  Trim any bushes or trees on the path to your home.  Use bright outdoor lighting.  Clear any walking paths of anything that might make someone trip, such as rocks or tools.  Regularly check to see if handrails are loose or broken. Make sure that both sides of any steps have handrails.  Any raised decks and porches should have guardrails on the edges.  Have any leaves, snow, or ice cleared regularly.  Use sand or salt on walking paths during winter.  Clean up any spills in your garage right away. This includes oil or grease spills. What can I do in the bathroom?  Use night lights.  Install grab bars by the toilet and in the tub and shower. Do not use towel bars as grab bars.  Use non-skid mats or decals in the tub or shower.  If you need to sit down in the shower, use a plastic, non-slip stool.  Keep the floor  dry. Clean up any water that spills on the floor as soon as it happens.  Remove soap buildup in the tub or shower regularly.  Attach bath mats securely with double-sided non-slip rug tape.  Do not have throw rugs and other things on the floor that can make you trip. What can I do in the bedroom?  Use night lights.  Make sure that you have a light by your bed that is easy to reach.  Do not use any sheets or blankets that are too big for your bed. They should not hang down onto the floor.  Have a firm chair that has side arms. You can use this for support while you get dressed.  Do not have throw rugs and other things on the floor that can make you trip. What can I do in the kitchen?  Clean up any spills right away.  Avoid walking on wet floors.  Keep items that you use a lot in easy-to-reach places.  If you need to reach something above you, use a strong step stool that has a grab bar.  Keep electrical cords out of the way.  Do not use floor polish or wax that makes  floors slippery. If you must use wax, use non-skid floor wax.  Do not have throw rugs and other things on the floor that can make you trip. What can I do with my stairs?  Do not leave any items on the stairs.  Make sure that there are handrails on both sides of the stairs and use them. Fix handrails that are broken or loose. Make sure that handrails are as long as the stairways.  Check any carpeting to make sure that it is firmly attached to the stairs. Fix any carpet that is loose or worn.  Avoid having throw rugs at the top or bottom of the stairs. If you do have throw rugs, attach them to the floor with carpet tape.  Make sure that you have a light switch at the top of the stairs and the bottom of the stairs. If you do not have them, ask someone to add them for you. What else can I do to help prevent falls?  Wear shoes that:  Do not have high heels.  Have rubber bottoms.  Are comfortable and fit you  well.  Are closed at the toe. Do not wear sandals.  If you use a stepladder:  Make sure that it is fully opened. Do not climb a closed stepladder.  Make sure that both sides of the stepladder are locked into place.  Ask someone to hold it for you, if possible.  Clearly mark and make sure that you can see:  Any grab bars or handrails.  First and last steps.  Where the edge of each step is.  Use tools that help you move around (mobility aids) if they are needed. These include:  Canes.  Walkers.  Scooters.  Crutches.  Turn on the lights when you go into a dark area. Replace any light bulbs as soon as they burn out.  Set up your furniture so you have a clear path. Avoid moving your furniture around.  If any of your floors are uneven, fix them.  If there are any pets around you, be aware of where they are.  Review your medicines with your doctor. Some medicines can make you feel dizzy. This can increase your chance of falling. Ask your doctor what other things that you can do to help prevent falls. This information is not intended to replace advice given to you by your health care provider. Make sure you discuss any questions you have with your health care provider. Document Released: 04/28/2009 Document Revised: 12/08/2015 Document Reviewed: 08/06/2014 Elsevier Interactive Patient Education  2017 [employer].

## 2018-04-23 NOTE — Progress Notes (Signed)
Subjective:   [name] is a 67 y.o. female who presents for Medicare Annual (Subsequent) preventive examination.  Review of Systems:  N/A Cardiac Risk Factors include: advanced age (>22men, >77 women);diabetes mellitus;hypertension     Objective:     Vitals: BP 132/76   Pulse 70   Temp 97.7 F (36.5 C) (Oral)   Ht 5' 5.5" (1.664 m)   Wt 234 lb 12.8 oz (106.5 kg)   LMP 01/23/2013   SpO2 95%   BMI 38.48 kg/m   Body mass index is 38.48 kg/m.  Advanced Directives 04/23/2018 02/02/2013 01/30/2013  Does Patient Have a Medical Advance Directive? No Patient does not have advance directive Patient does not have advance directive  Would patient like information on creating a medical advance directive? Yes (MAU/Ambulatory/Procedural Areas - Information given) - -    Tobacco Social History   Tobacco Use  Smoking Status Never Smoker  Smokeless Tobacco Never Used     Counseling given: Not Answered   Clinical Intake:  Pre-visit preparation completed: Yes  Pain : No/denies pain Pain Score: 0-No pain     Nutritional Status: BMI > 30  Obese Nutritional Risks: None Diabetes: Yes CBG done?: No Did pt. bring in CBG monitor from home?: No  How often do you need to have someone help you when you read instructions, pamphlets, or other written materials from your doctor or pharmacy?: 1 - Never What is the last grade level you completed in school?: 1 year college  Interpreter Needed?: No  Information entered by :: NAllen LPN  Past Medical History:  Diagnosis Date  . Diabetes mellitus without complication ([place])   . GERD (gastroesophageal reflux disease)   . Headache(784.0)   . High cholesterol   . Hypertension   . Sleep apnea    mild- no CPAP  . Stroke [facility]) 2005   mini- stroke due to HTN   Past Surgical History:  Procedure Laterality Date  . CHEST TUBE INSERTION  2004   lung punctured during shoulder surgery  . FOOT SURGERY     bilateral foot surgery  .  HYSTEROSCOPY W/D&C N/A 02/02/2013   Procedure: DILATATION AND CURETTAGE /HYSTEROSCOPY;  Surgeon: Thurnell Lose, MD;  Location: [facility];  Service: Gynecology;  Laterality: N/A;  . KNEE ARTHROSCOPY    . SHOULDER ARTHROSCOPY    . TUBAL LIGATION     Family History  Problem Relation Age of Onset  . Cancer Mother   . Hypertension Mother   . Cancer Maternal Grandmother   . Hypertension Maternal Grandmother    Social History   Socioeconomic History  . Marital status: Single    Spouse name: Not on file  . Number of children: Not on file  . Years of education: Not on file  . Highest education level: Not on file  Occupational History  . Occupation: retired  Scientific laboratory technician  . Financial resource strain: Not hard at all  . Food insecurity:    Worry: Never true    Inability: Never true  . Transportation needs:    Medical: No    Non-medical: No  Tobacco Use  . Smoking status: Never Smoker  . Smokeless tobacco: Never Used  Substance and Sexual Activity  . Alcohol use: Yes    Comment: socially  . Drug use: No  . Sexual activity: Not Currently  Lifestyle  . Physical activity:    Days per week: 3 days    Minutes per session: 60 min  . Stress: Not  at all  Relationships  . Social connections:    Talks on phone: Not on file    Gets together: Not on file    Attends religious service: Not on file    Active member of club or organization: Not on file    Attends meetings of clubs or organizations: Not on file    Relationship status: Not on file  Other Topics Concern  . Not on file  Social History Narrative  . Not on file    Outpatient Encounter Medications as of 04/23/2018  Medication Sig  . Cholecalciferol (VITAMIN D) 2000 UNITS tablet Take 2,000 Units by mouth daily.   Marland Kitchen levocetirizine (XYZAL) 5 MG tablet Take 5 mg by mouth daily as needed for allergies.  . metFORMIN (GLUCOPHAGE-XR) 750 MG 24 hr tablet   . olmesartan-hydrochlorothiazide (BENICAR HCT) 40-25 MG per tablet Take 1  tablet by mouth daily.  Marland Kitchen OZEMPIC 0.25 or 0.5 MG/DOSE SOPN   . Triamcinolone Acetonide (NASACORT AQ NA) Place into the nose.  . pantoprazole (PROTONIX) 40 MG tablet TK 1 T PO QD   No facility-administered encounter medications on file as of 04/23/2018.     Activities of Daily Living In your present state of health, do you have any difficulty performing the following activities: 04/23/2018  Hearing? N  Vision? N  Difficulty concentrating or making decisions? N  Walking or climbing stairs? N  Dressing or bathing? N  Doing errands, shopping? N  Preparing Food and eating ? N  Using the Toilet? N  In the past six months, have you accidently leaked urine? N  Do you have problems with loss of bowel control? N  Managing your Medications? N  Managing your Finances? N  Housekeeping or managing your Housekeeping? N  Some recent data might be hidden    Patient Care Team: Glendale Chard, MD as PCP - General (Internal Medicine) Warden Fillers, MD as Consulting Physician (Ophthalmology)    Assessment:   This is a routine wellness examination for [name].  Exercise Activities and Dietary recommendations Current Exercise Habits: Structured exercise class, Type of exercise: calisthenics;strength training/weights, Time (Minutes): 60, Frequency (Times/Week): 3, Weekly Exercise (Minutes/Week): 180, Intensity: Intense, Exercise limited by: None identified  Goals    . DIET - REDUCE CALORIE INTAKE (pt-stated)     Patient is cutting down on breads, increasing water intake.       Fall Risk Fall Risk  04/23/2018 02/27/2018  Falls in the past year? No No  Risk for fall due to : Medication side effect -   Is the patient's home free of loose throw rugs in walkways, pet beds, electrical cords, etc?   yes      Grab bars in the bathroom? yes      Handrails on the stairs?   yes      Adequate lighting?   yes  Timed Get Up and Go performed: N/A  Depression Screen PHQ 2/9 Scores 04/23/2018  PHQ - 2  Score 0     Cognitive Function     6CIT Screen 04/23/2018  What Year? 0 points  What month? 0 points  What time? 0 points  Count back from 20 0 points  Months in reverse 0 points  Repeat phrase 0 points  Total Score 0    Immunization History  Administered Date(s) Administered  . Influenza, High Dose Seasonal PF 04/23/2018  . Pneumococcal Polysaccharide-23 04/23/2018  . Tdap 12/26/2017    Qualifies for Shingles Vaccine?decline  Screening Tests Health Maintenance  Topic Date Due  . Hepatitis C Screening  1950-08-16  . PNA vac Low Risk Adult (2 of 2 - PPSV23) 04/24/2019  . MAMMOGRAM  04/30/2019  . COLONOSCOPY  10/30/2022  . TETANUS/TDAP  12/27/2027  . INFLUENZA VACCINE  Completed  . DEXA SCAN  Completed    Cancer Screenings: Lung: Low Dose CT Chest recommended if Age 41-80 years, 30 pack-year currently smoking OR have quit w/in 15years. Patient does not qualify. Breast:  Up to date on Mammogram? Yes   Up to date of Bone Density/Dexa? Yes Colorectal: up to date  Additional Screenings: : Hepatitis C Screening: due     Plan:    Patient received both fluzone high dose and PPV23 vaccinations today.    I have personally reviewed and noted the following in the patient's chart:   . Medical and social history . Use of alcohol, tobacco or illicit drugs  . Current medications and supplements . Functional ability and status . Nutritional status . Physical activity . Advanced directives . List of other physicians . Hospitalizations, surgeries, and ER visits in previous 12 months . Vitals . Screenings to include cognitive, depression, and falls . Referrals and appointments  In addition, I have reviewed and discussed with patient certain preventive protocols, quality metrics, and best practice recommendations. A written personalized care plan for preventive services as well as general preventive health recommendations were provided to patient.     Kellie Simmering,  LPN  [id number]

## 2018-05-03 ENCOUNTER — Other Ambulatory Visit: Payer: Self-pay | Admitting: Internal Medicine

## 2018-05-06 ENCOUNTER — Ambulatory Visit
Admission: RE | Admit: 2018-05-06 | Discharge: 2018-05-06 | Disposition: A | Discharge status: Home / Self Care | Payer: Medicare Other | Source: Ambulatory Visit | Attending: Internal Medicine | Admitting: Internal Medicine

## 2018-05-06 DIAGNOSIS — Z1231 Encounter for screening mammogram for malignant neoplasm of breast: Secondary | ICD-10-CM

## 2018-05-19 ENCOUNTER — Encounter: Payer: Self-pay | Admitting: Nurse Practitioner

## 2018-05-19 ENCOUNTER — Ambulatory Visit: Payer: Medicare Other | Admitting: Nurse Practitioner

## 2018-05-19 ENCOUNTER — Telehealth: Payer: Self-pay | Admitting: Internal Medicine

## 2018-05-19 VITALS — BP 122/84 | HR 90 | Temp 97.5°F | Ht 65.25 in | Wt 222.6 lb

## 2018-05-19 DIAGNOSIS — K5900 Constipation, unspecified: Secondary | ICD-10-CM | POA: Diagnosis not present

## 2018-05-19 DIAGNOSIS — M545 Low back pain, unspecified: Secondary | ICD-10-CM

## 2018-05-19 DIAGNOSIS — R112 Nausea with vomiting, unspecified: Secondary | ICD-10-CM

## 2018-05-19 DIAGNOSIS — R519 Headache, unspecified: Secondary | ICD-10-CM

## 2018-05-19 DIAGNOSIS — R51 Headache: Secondary | ICD-10-CM

## 2018-05-19 LAB — POCT URINALYSIS DIPSTICK
GLUCOSE UA: NEGATIVE
Ketones, UA: NEGATIVE
Leukocytes, UA: NEGATIVE
NITRITE UA: NEGATIVE
PROTEIN UA: POSITIVE — AB
RBC UA: NEGATIVE
Spec Grav, UA: >=1.03 — AB (ref 1.010–1.025)
Urobilinogen, UA: 0.2 E.U./dL
pH, UA: 5 (ref 5.0–8.0)

## 2018-05-19 LAB — POC INFLUENZA A&B (BINAX/QUICKVUE)
INFLUENZA A, POC: NEGATIVE
INFLUENZA B, POC: NEGATIVE

## 2018-05-19 MED ORDER — PROMETHAZINE HCL 12.5 MG PO TABS: 12.5000 mg | ORAL_TABLET | Freq: Three times a day (TID) | ORAL | 0 refills | Status: DC | PRN

## 2018-05-19 NOTE — Telephone Encounter (Signed)
PT LVM WANTING APPT STATING THAT SHE IS UNABLE TO KEEP ANYTHING ON HER STOMACH.Marland Kitchen ATT TO CALL PT TO SCHEDULE APPT NO ANS LVM TO CALL OFC

## 2018-05-19 NOTE — Progress Notes (Signed)
Subjective:     Patient ID: [name] , female    DOB: Nov 28, 1950 , 67 y.o.   MRN: [id number]   Chief Complaint  Patient presents with  . Hip Pain    patient states she is having pain in her groin area. pt states she is feeling dizzy and nauseated she stated she has not had a good bowel movement.    HPI  Hip Pain   The incident occurred 5 to 7 days ago. There was no injury mechanism. The pain is present in the left hip. The pain is moderate. The pain has been intermittent since onset. Pertinent negatives include no inability to bear weight, loss of motion, loss of sensation, numbness or tingling. She reports no foreign bodies present. The symptoms are aggravated by movement.  Emesis   This is a new problem. The current episode started in the past 7 days. The problem occurs less than 2 times per day. The problem has been gradually improving. Associated symptoms include abdominal pain and headaches. Pertinent negatives include no chills, coughing, dizziness, fever or myalgias. She has tried nothing for the symptoms. The treatment provided no relief.  Headache   Associated symptoms include abdominal pain, nausea and vomiting. Pertinent negatives include no coughing, dizziness, fever, numbness or tingling.     Past Medical History:  Diagnosis Date  . Diabetes mellitus without complication ([place])   . GERD (gastroesophageal reflux disease)   . Headache(784.0)   . High cholesterol   . Hypertension   . Sleep apnea    mild- no CPAP  . Stroke [facility]) 2005   mini- stroke due to HTN     Family History  Problem Relation Age of Onset  . Cancer Mother   . Hypertension Mother   . Cancer Maternal Grandmother   . Hypertension Maternal Grandmother      Current Outpatient Medications:  .  Cholecalciferol (VITAMIN D) 2000 UNITS tablet, Take 2,000 Units by mouth daily. , Disp: , Rfl:  .  levocetirizine (XYZAL) 5 MG tablet, Take 5 mg by mouth daily as needed for allergies., Disp: , Rfl:  .   metFORMIN (GLUCOPHAGE-XR) 750 MG 24 hr tablet, TAKE 1 TABLET BY MOUTH  EVERY DAY WITH EVENING MEAL, Disp: 90 tablet, Rfl: 1 .  Olmesartan-amLODIPine-HCTZ 40-5-12.5 MG TABS, TAKE 1 TABLET BY MOUTH  EVERY DAY, Disp: 90 tablet, Rfl: 1 .  OZEMPIC 0.25 or 0.5 MG/DOSE SOPN, , Disp: , Rfl:  .  Triamcinolone Acetonide (NASACORT AQ NA), Place into the nose., Disp: , Rfl:    Allergies  Allergen Reactions  . Codeine Nausea And Vomiting     Review of Systems  Constitutional: Negative for chills, fatigue and fever.  HENT: Negative.  Negative for voice change.   Respiratory: Negative for cough, choking, shortness of breath and wheezing.   Cardiovascular: Negative.   Gastrointestinal: Positive for abdominal pain, constipation, nausea and vomiting.  Musculoskeletal: Negative for myalgias.  Neurological: Positive for headaches. Negative for dizziness, tingling, syncope and numbness.     Today's Vitals   05/19/18 1524  BP: 122/84  Pulse: 90  Temp: (!) 97.5 F (36.4 C)  TempSrc: Oral  SpO2: 93%  Weight: 222 lb 9.6 oz (101 kg)  Height: 5' 5.25" (1.657 m)  PainSc: 8   PainLoc: Head   Body mass index is 36.76 kg/m.   Objective:  Physical Exam  Constitutional: She is oriented to person, place, and time. She appears well-developed and well-nourished.  HENT:  Nose: Right sinus  exhibits frontal sinus tenderness. Left sinus exhibits frontal sinus tenderness.  Eyes: Pupils are equal, round, and reactive to light. Conjunctivae and EOM are normal. Left eye exhibits no discharge.  Musculoskeletal: Normal range of motion.  Neurological: She is alert and oriented to person, place, and time.  Skin: Skin is warm and dry. Capillary refill takes less than 2 seconds.        Assessment And Plan:     1. Acute nonintractable headache, unspecified headache type  She does have tenderness to her frontal sinuses  I have advised her if she has worsening headache to go to ER for further evaluation.  Will  check CT scan of head without contrast.   Encouraged to take tylenol only for her headache. - CT HEAD WO CONTRAST; Future  2. Non-intractable vomiting with nausea, unspecified vomiting type  Phenergan 12.5 mg as needed every 8 hours  Advised to take when not operating heavy machinery or driving  Negative influenza A/B - promethazine (PHENERGAN) 12.5 MG tablet; Take 1 tablet (12.5 mg total) by mouth every 8 (eight) hours as needed for nausea or vomiting.  Dispense: 20 tablet; Refill: 0 - POC Influenza A&B(BINAX/QUICKVUE)  3. Constipation, unspecified constipation type  Will try linzess 72 mcg daily  Increase water intake  4. Acute right-sided low back pain, unspecified whether sciatica present  Negative for UTI  May be related to her constipation - POCT Urinalysis Dipstick (55732)      Minette Brine, FNP

## 2018-05-20 ENCOUNTER — Telehealth: Payer: Self-pay

## 2018-05-20 NOTE — Telephone Encounter (Signed)
Called pt to see how she was feeling she stated she is feeling much better. YRL,RMA

## 2018-05-21 NOTE — Telephone Encounter (Signed)
Great thank you!

## 2018-05-26 ENCOUNTER — Ambulatory Visit
Admission: RE | Admit: 2018-05-26 | Discharge: 2018-05-26 | Disposition: A | Discharge status: Home / Self Care | Payer: Medicare Other | Source: Ambulatory Visit | Attending: Nurse Practitioner | Admitting: Nurse Practitioner

## 2018-05-26 DIAGNOSIS — R519 Headache, unspecified: Secondary | ICD-10-CM

## 2018-05-26 DIAGNOSIS — R51 Headache: Principal | ICD-10-CM

## 2018-06-02 ENCOUNTER — Encounter: Payer: Self-pay | Admitting: Internal Medicine

## 2018-06-02 ENCOUNTER — Ambulatory Visit: Payer: Medicare Other | Admitting: Internal Medicine

## 2018-06-02 VITALS — BP 114/66 | HR 56 | Ht 65.25 in | Wt 228.0 lb

## 2018-06-02 DIAGNOSIS — Z0000 Encounter for general adult medical examination without abnormal findings: Secondary | ICD-10-CM | POA: Diagnosis not present

## 2018-06-02 DIAGNOSIS — E1165 Type 2 diabetes mellitus with hyperglycemia: Secondary | ICD-10-CM | POA: Diagnosis not present

## 2018-06-02 DIAGNOSIS — Z6837 Body mass index (BMI) 37.0-37.9, adult: Secondary | ICD-10-CM

## 2018-06-02 DIAGNOSIS — I10 Essential (primary) hypertension: Secondary | ICD-10-CM

## 2018-06-02 NOTE — Patient Instructions (Signed)
Obesity, Adult Obesity is having too much body fat. If you have a BMI of 30 or more, you are obese. BMI is a number that explains how much body fat you have. Obesity is often caused by taking in (consuming) more calories than your body uses. Obesity can cause serious health problems. Changing your lifestyle can help to treat obesity. Follow these instructions at home: Eating and drinking   Follow advice from your doctor about what to eat and drink. Your doctor may tell you to: ? Cut down on (limit) fast foods, sweets, and processed snack foods. ? Choose low-fat options. For example, choose low-fat milk instead of whole milk. ? Eat 5 or more servings of fruits or vegetables every day. ? Eat at home more often. This gives you more control over what you eat. ? Choose healthy foods when you eat out. ? Learn what a healthy portion size is. A portion size is the amount of a certain food that is healthy for you to eat at one time. This is different for each person. ? Keep low-fat snacks available. ? Avoid sugary drinks. These include soda, fruit juice, iced tea that is sweetened with sugar, and flavored milk. ? Eat a healthy breakfast.  Drink enough water to keep your pee (urine) clear or pale yellow.  Do not go without eating for long periods of time (do not fast).  Do not go on popular or trendy diets (fad diets). Physical Activity  Exercise often, as told by your doctor. Ask your doctor: ? What types of exercise are safe for you. ? How often you should exercise.  Warm up and stretch before being active.  Do slow stretching after being active (cool down).  Rest between times of being active. Lifestyle  Limit how much time you spend in front of your TV, computer, or video game system (be less sedentary).  Find ways to reward yourself that do not involve food.  Limit alcohol intake to no more than 1 drink a day for nonpregnant women and 2 drinks a day for men. One drink equals 12 oz  of beer, 5 oz of wine, or 1 oz of hard liquor. General instructions  Keep a weight loss journal. This can help you keep track of: ? The food that you eat. ? The exercise that you do.  Take over-the-counter and prescription medicines only as told by your doctor.  Take vitamins and supplements only as told by your doctor.  Think about joining a support group. Your doctor may be able to help with this.  Keep all follow-up visits as told by your doctor. This is important. Contact a doctor if:  You cannot meet your weight loss goal after you have changed your diet and lifestyle for 6 weeks. This information is not intended to replace advice given to you by your health care provider. Make sure you discuss any questions you have with your health care provider. Document Released: 09/24/2011 Document Revised: 12/08/2015 Document Reviewed: 04/20/2015 Elsevier Interactive Patient Education  2018 Elsevier Inc.  

## 2018-06-02 NOTE — Progress Notes (Signed)
Subjective:     Patient ID: [name] , female    DOB: 1950/09/15 , 67 y.o.   MRN: [id number]   Chief Complaint  Patient presents with  . Annual Exam  . Diabetes  . Hypertension    HPI  She is here today for a full physical examination. She is no longer followed by GYN. She has had her mammogram this year.   Diabetes  She presents for her follow-up diabetic visit. She has type 2 diabetes mellitus. Her disease course has been improving. There are no hypoglycemic associated symptoms. There are no diabetic associated symptoms. Pertinent negatives for diabetes include no blurred vision and no chest pain. There are no hypoglycemic complications. There are no diabetic complications. Risk factors for coronary artery disease include diabetes mellitus, hypertension, obesity and post-menopausal. She is compliant with treatment most of the time. Her breakfast blood glucose is taken between 8-9 am. Her breakfast blood glucose range is generally 70-90 mg/dl. Eye exam is current.  Hypertension  This is a chronic problem. The current episode started more than 1 year ago. The problem is unchanged. The problem is controlled. Pertinent negatives include no blurred vision, chest pain, neck pain, orthopnea, palpitations, PND or shortness of breath.   She reports compliance with meds.   Past Medical History:  Diagnosis Date  . Diabetes mellitus without complication ([place])   . GERD (gastroesophageal reflux disease)   . Headache(784.0)   . High cholesterol   . Hypertension   . Sleep apnea    mild- no CPAP  . Stroke [facility]) 2005   mini- stroke due to HTN     Family History  Problem Relation Age of Onset  . Cancer Mother   . Hypertension Mother   . Cancer Maternal Grandmother   . Hypertension Maternal Grandmother      Current Outpatient Medications:  .  Cholecalciferol (VITAMIN D) 2000 UNITS tablet, Take 2,000 Units by mouth daily. , Disp: , Rfl:  .  levocetirizine (XYZAL) 5 MG tablet, Take 5 mg  by mouth daily as needed for allergies., Disp: , Rfl:  .  metFORMIN (GLUCOPHAGE-XR) 750 MG 24 hr tablet, TAKE 1 TABLET BY MOUTH  EVERY DAY WITH EVENING MEAL, Disp: 90 tablet, Rfl: 1 .  Olmesartan-amLODIPine-HCTZ 40-5-12.5 MG TABS, TAKE 1 TABLET BY MOUTH  EVERY DAY, Disp: 90 tablet, Rfl: 1 .  OZEMPIC 0.25 or 0.5 MG/DOSE SOPN, , Disp: , Rfl:  .  promethazine (PHENERGAN) 12.5 MG tablet, Take 1 tablet (12.5 mg total) by mouth every 8 (eight) hours as needed for nausea or vomiting., Disp: 20 tablet, Rfl: 0 .  Triamcinolone Acetonide (NASACORT AQ NA), Place into the nose., Disp: , Rfl:    Allergies  Allergen Reactions  . Codeine Nausea And Vomiting     Review of Systems  Constitutional: Negative.   HENT: Negative.   Eyes: Negative.  Negative for blurred vision.  Respiratory: Negative.  Negative for shortness of breath.   Cardiovascular: Negative.  Negative for chest pain, palpitations, orthopnea and PND.  Gastrointestinal: Negative.   Endocrine: Negative.   Genitourinary: Negative.   Musculoskeletal: Negative.  Negative for neck pain.  Skin: Negative.   Allergic/Immunologic: Negative.   Neurological: Negative.   Hematological: Negative.   Psychiatric/Behavioral: Negative.      Today's Vitals   06/02/18 1411  BP: 114/66  Pulse: (!) 56  Weight: 228 lb (103.4 kg)  Height: 5' 5.25" (1.657 m)   Body mass index is 37.65 kg/m.   Objective:  Physical Exam  Constitutional: She is oriented to person, place, and time. She appears well-developed and well-nourished.  HENT:  Head: Normocephalic and atraumatic.  Right Ear: External ear normal.  Left Ear: External ear normal.  Nose: Nose normal.  Mouth/Throat: Oropharynx is clear and moist.  Eyes: Pupils are equal, round, and reactive to light. Conjunctivae and EOM are normal.  Neck: Normal range of motion. Neck supple.  Cardiovascular: Normal rate, regular rhythm, normal heart sounds and intact distal pulses.  Pulses:      Dorsalis  pedis pulses are 2+ on the right side, and 2+ on the left side.  Pulmonary/Chest: Effort normal and breath sounds normal. Right breast exhibits no inverted nipple, no mass, no nipple discharge, no skin change and no tenderness. Left breast exhibits no inverted nipple, no mass, no nipple discharge, no skin change and no tenderness.  Abdominal: Soft. Bowel sounds are normal.  Genitourinary:  Genitourinary Comments: deferred  Musculoskeletal: Normal range of motion.       Right foot: There is normal range of motion.       Left foot: There is normal range of motion.  Feet:  Right Foot:  Protective Sensation: 5 sites tested. 5 sites sensed.  Left Foot:  Protective Sensation: 5 sites tested. 5 sites sensed.  Neurological: She is alert and oriented to person, place, and time.  Skin: Skin is warm and dry.  Psychiatric: She has a normal mood and affect.  Nursing note and vitals reviewed.       Assessment And Plan:     1. Routine general medical examination at health care facility  A full exam was performed. Importance of monthly self breast exams was discussed with the patent.  PATIENT HAS BEEN ADVISED TO GET 30-45 MINUTES REGULAR EXERCISE NO LESS THAN FOUR TO FIVE DAYS PER WEEK - BOTH WEIGHTBEARING EXERCISES AND AEROBIC ARE RECOMMENDED.  SHE IS ADVISED TO FOLLOW A HEALTHY DIET WITH AT LEAST SIX FRUITS/VEGGIES PER DAY, DECREASE INTAKE OF RED MEAT, AND TO INCREASE FISH INTAKE TO TWO DAYS PER WEEK.  MEATS/FISH SHOULD NOT BE FRIED, BAKED OR BROILED IS PREFERABLE.  I SUGGEST WEARING SPF 50 SUNSCREEN ON EXPOSED PARTS AND ESPECIALLY WHEN IN THE DIRECT SUNLIGHT FOR AN EXTENDED PERIOD OF TIME.  PLEASE AVOID FAST FOOD RESTAURANTS AND INCREASE YOUR WATER INTAKE.   2. Type 2 diabetes mellitus with hyperglycemia, without long-term current use of insulin ([place])  Diabetic foot exam was performed.  I DISCUSSED WITH THE PATIENT AT LENGTH REGARDING THE GOALS OF GLYCEMIC CONTROL AND POSSIBLE LONG-TERM  COMPLICATIONS.  I  ALSO STRESSED THE IMPORTANCE OF COMPLIANCE WITH HOME GLUCOSE MONITORING, DIETARY RESTRICTIONS INCLUDING AVOIDANCE OF SUGARY DRINKS/PROCESSED FOODS,  ALONG WITH REGULAR EXERCISE.  I  ALSO STRESSED THE IMPORTANCE OF ANNUAL EYE EXAMS, SELF FOOT CARE AND COMPLIANCE WITH OFFICE VISITS.  - CMP14+EGFR - Hemoglobin A1c - CBC no Diff  3. Essential hypertension, benign  Well controlled. She will continue with current meds. EKG performed. She is encouraged to avoid adding salt to her foods.  - EKG 12-Lead  4. Class 2 severe obesity due to excess calories with serious comorbidity and body mass index (BMI) of 37.0 to 37.9 in adult [facility])  She is congratulated on her 28 pound weight loss thus far in the past five months. She is encouraged to keep up the great work. She is encouraged to strive for BMI less than 32 to decrease cardiac risk (our next goal).   [name], MD

## 2018-06-03 LAB — CMP14+EGFR
ALBUMIN: 4 g/dL (ref 3.6–4.8)
ALT: 14 IU/L (ref 0–32)
AST: 16 IU/L (ref 0–40)
Albumin/Globulin Ratio: 1.5 (ref 1.2–2.2)
Alkaline Phosphatase: 79 IU/L (ref 39–117)
BUN/Creatinine Ratio: 24 (ref 12–28)
BUN: 17 mg/dL (ref 8–27)
Bilirubin Total: <0.2 mg/dL (ref 0.0–1.2)
CALCIUM: 9.6 mg/dL (ref 8.7–10.3)
CO2: 22 mmol/L (ref 20–29)
CREATININE: 0.72 mg/dL (ref 0.57–1.00)
Chloride: 106 mmol/L (ref 96–106)
GFR calc Af Amer: 100 mL/min/{1.73_m2} (ref >59)
GFR, EST NON AFRICAN AMERICAN: 87 mL/min/{1.73_m2} (ref >59)
Globulin, Total: 2.6 g/dL (ref 1.5–4.5)
Glucose: 82 mg/dL (ref 65–99)
Potassium: 4.3 mmol/L (ref 3.5–5.2)
SODIUM: 143 mmol/L (ref 134–144)
Total Protein: 6.6 g/dL (ref 6.0–8.5)

## 2018-06-03 LAB — CBC
HEMATOCRIT: 40.6 % (ref 34.0–46.6)
HEMOGLOBIN: 13.1 g/dL (ref 11.1–15.9)
MCH: 25.2 pg — ABNORMAL LOW (ref 26.6–33.0)
MCHC: 32.3 g/dL (ref 31.5–35.7)
MCV: 78 fL — ABNORMAL LOW (ref 79–97)
Platelets: 422 10*3/uL (ref 150–450)
RBC: 5.19 x10E6/uL (ref 3.77–5.28)
RDW: 14.4 % (ref 12.3–15.4)
WBC: 6.1 10*3/uL (ref 3.4–10.8)

## 2018-06-03 LAB — HEMOGLOBIN A1C
ESTIMATED AVERAGE GLUCOSE: 117 mg/dL
Hgb A1c MFr Bld: 5.7 % — ABNORMAL HIGH (ref 4.8–5.6)

## 2018-06-03 NOTE — Progress Notes (Signed)
Here are your lab results:  Your liver and kidney function are stable. Your a1c is great at 5.7. In order to come off of metformin, we may need to increase the dose of Ozempic. How do you feel about this?   Your blood count is normal.   Happy holidays to you and your family! Safe travels to [place]!  Sincerely,    Caelin Rosen N. [facility], MD   Your blood count is normal.

## 2018-07-02 ENCOUNTER — Encounter: Payer: Self-pay | Admitting: Nurse Practitioner

## 2018-07-02 ENCOUNTER — Ambulatory Visit: Payer: Medicare Other | Admitting: Nurse Practitioner

## 2018-07-02 VITALS — BP 140/80 | HR 66 | Temp 98.0°F | Ht 65.2 in | Wt 223.0 lb

## 2018-07-02 DIAGNOSIS — M25552 Pain in left hip: Secondary | ICD-10-CM | POA: Diagnosis not present

## 2018-07-02 MED ORDER — TRIAMCINOLONE ACETONIDE 40 MG/ML IJ SUSP
60.0000 mg | Freq: Once | INTRAMUSCULAR | Status: AC
  Administered 2018-07-02: 60 mg via INTRAMUSCULAR

## 2018-07-02 NOTE — Patient Instructions (Signed)

## 2018-07-02 NOTE — Progress Notes (Signed)
Subjective:     Patient ID: [name] , female    DOB: [birth year] , 67 y.o.   MRN: [id number]   Chief Complaint  Patient presents with  . Hip Pain    patient states she has been having hip pain for the past week. patient states the pain is sharp and aching when she moves a certain way. the pain is in her let flank area and radiates down into her groin.    HPI  Hip Pain   The incident occurred more than 1 week ago. There was no injury mechanism. The pain is present in the left hip. The quality of the pain is described as aching and stabbing. The pain is at a severity of 9/10. The pain is moderate. The pain has been intermittent (when moving more) since onset. Pertinent negatives include no inability to bear weight, numbness or tingling. She reports no foreign bodies present. The symptoms are aggravated by movement. She has tried NSAIDs (alcohol only to the area) for the symptoms. The treatment provided mild relief.     Past Medical History:  Diagnosis Date  . Diabetes mellitus without complication ([place])   . GERD (gastroesophageal reflux disease)   . Headache(784.0)   . High cholesterol   . Hypertension   . Sleep apnea    mild- no CPAP  . Stroke [facility]) 2005   mini- stroke due to HTN     Family History  Problem Relation Age of Onset  . Cancer Mother   . Hypertension Mother   . Cancer Maternal Grandmother   . Hypertension Maternal Grandmother      Current Outpatient Medications:  .  Cholecalciferol (VITAMIN D) 2000 UNITS tablet, Take 2,000 Units by mouth daily. , Disp: , Rfl:  .  levocetirizine (XYZAL) 5 MG tablet, Take 5 mg by mouth daily as needed for allergies., Disp: , Rfl:  .  metFORMIN (GLUCOPHAGE-XR) 750 MG 24 hr tablet, TAKE 1 TABLET BY MOUTH  EVERY DAY WITH EVENING MEAL, Disp: 90 tablet, Rfl: 1 .  Multiple Vitamin (MULTIVITAMIN WITH MINERALS) TABS tablet, Take 1 tablet by mouth daily., Disp: , Rfl:  .  Olmesartan-amLODIPine-HCTZ 40-5-12.5 MG TABS, TAKE 1 TABLET BY  MOUTH  EVERY DAY, Disp: 90 tablet, Rfl: 1 .  OZEMPIC 0.25 or 0.5 MG/DOSE SOPN, , Disp: , Rfl:  .  Triamcinolone Acetonide (NASACORT AQ NA), Place into the nose., Disp: , Rfl:    Allergies  Allergen Reactions  . Codeine Nausea And Vomiting     Review of Systems  Respiratory: Negative.   Cardiovascular: Negative.   Gastrointestinal: Negative.   Genitourinary: Negative for frequency.  Musculoskeletal: Positive for arthralgias and gait problem.  Skin: Negative.   Neurological: Negative for dizziness, tingling and numbness.     Today's Vitals   07/02/18 1543  BP: 140/80  Pulse: 66  Temp: 98 F (36.7 C)  TempSrc: Oral  SpO2: 97%  Weight: 223 lb (101.2 kg)  Height: 5' 5.2" (1.656 m)  PainSc: 8   PainLoc: Hip   Body mass index is 36.88 kg/m.   Objective:  Physical Exam Constitutional:      Appearance: Normal appearance.  Cardiovascular:     Rate and Rhythm: Normal rate.     Pulses: Normal pulses.     Heart sounds: Normal heart sounds.  Pulmonary:     Effort: Pulmonary effort is normal.     Breath sounds: Normal breath sounds.  Musculoskeletal: Normal range of motion.  General: Tenderness (left hip and groin) present. No signs of injury.  Skin:    General: Skin is warm.  Neurological:     General: No focal deficit present.     Mental Status: She is alert.  Psychiatric:        Mood and Affect: Mood normal.         Assessment And Plan:  1. Left hip pain  Tenderness to left hip and groin likely inflammation vs sciatic pain  Will treat with kenalog 60 mg IM - triamcinolone acetonide (KENALOG-40) injection 60 mg   Minette Brine, FNP

## 2018-07-27 ENCOUNTER — Encounter: Payer: Self-pay | Admitting: Neurology

## 2018-07-30 ENCOUNTER — Encounter: Payer: Self-pay | Admitting: Neurology

## 2018-07-30 ENCOUNTER — Ambulatory Visit: Payer: Medicare Other | Admitting: Neurology

## 2018-07-30 DIAGNOSIS — Z9989 Dependence on other enabling machines and devices: Secondary | ICD-10-CM | POA: Diagnosis not present

## 2018-07-30 DIAGNOSIS — G4733 Obstructive sleep apnea (adult) (pediatric): Secondary | ICD-10-CM

## 2018-07-30 NOTE — Assessment & Plan Note (Signed)
Auto setting CPAP , 5-10 cm water.

## 2018-07-30 NOTE — Progress Notes (Signed)
SLEEP MEDICINE CLINIC   Provider:  Larey Seat, MD  Primary Care Physician:  Glendale Chard, MD   Referring Provider: Glendale Chard, MD    Chief Complaint  Patient presents with  . New Patient (Initial Visit)    pt alone, rm 10. pt states that she has not been sleeping well through the last year. She gets 4- 4.5 hours each night of uninterupted sleep. Much improved ! No nocturia or only once.   Pt. states she is told she snores in sleep, has for years. Had a sleep study several years ago at [facility] -  She states it did show mild apnea that wasn't enough for CPAP treatment.    HPI: [name] is a 68 y.o. female patient, who was seen on 02-28-2028 upon referral from Dr. Baird Cancer for a sleep evaluation. She is now seen in her first Rv after her sleep studies. Mrs. post underwent a baseline attended polysomnography study on 14 March 2018 which revealed mild sleep apnea her AHI was only 7.3, all apneas were obstructive in nature, but during REM sleep there was an exacerbation of apnea to 40 events per hour.  Notable was that REM sleep was suppressed likely because of the apneas clustering in the sleep stage, and only amounted to 6% of the sleep architecture.  There was no significant oxygen desaturation but several periodic limb movements were noted.  The EKG remained in sinus rhythm with some isolated PVCs  My recommendation was to pursue weight loss advised the patient to avoid supine sleep position and as CPAP return.  The patient return for CPAP titration on 06 April 2018 at the time her BMI was still 39.  She had a very long sleep latency took her 73 minutes to fall asleep.  Sleep efficiency was accordingly reduced to only 62% of the recorded time but once she was able to initiate sleep on CPAP she was virtually apnea free and interestingly her REM sleep proportion rose to 15.5%.  There was no oxygen desaturation and her periodic limb movements have disappeared.   At only 6 cmH2O pressure on CPAP there was no longer apnea noted no longer snoring noted and she slept for 98.5% of the recorded time.  The patient is now using an AutoSet between 5 and 10 cmH2O with 3 cm EPR and she has been 100% compliant for the last 30 days her average user time is 7 hours 32 minutes and the residual AHI is 0.4/h based on these data I do not need to make any changes her apnea is successfully treated and alleviated.  The patient reports that she feels better and that her sleep has a higher quality is more restorative and refreshing.  She endorsed the fatigue severity score lower than pre-CPAP, at currently 25 points out of 63 possible points and her Epworth Sleepiness Scale was endorsed at 2 out of 24 possible points. In addition Her BMI has been reduced and she currently has a BMI of 36/kg/m-  Lost 40 pounds in 10 month.  She clearly has benefitted from weight loss and form CPAP therapy. Nocturia times one now, from  2-3    INITIAL VISIT:  02-27-2018.  I have the pleasure of meeting [name] today, on 27 February 2018, she is a 68 year old female patient of African-American and Native American descent. She reports that her family and friends have told her that she snores but she has never woken herself from snoring or apnea.  She also denies being excessively daytime sleepy or fatigued but this is reflected in her Epworth Sleepiness Scale being endorsed at only 5 points fatigue severity at 27 points and she also does not have evidence of clinical depression according to her geriatric depression scale endorsed at 1 out of 15 possible points.  Dr. Baird Cancer stated that she has seen this patient for abdominal pain, diabetes type 2 and essential hypertension.  The abdominal pain is not associated with blood in the stool, there is no alteration between constipation and diarrhea.  She does have some gas but this affects her mostly in form of belching.  Diabetes has been compliantly treated with  oral medication, she has a comorbidity of hypertension, she has not shown signs of nocturia, polydipsia, slow healing wounds.  She is not lightheaded when she wakes up in the morning.  She does not have chest pain.  Her hypertension has been exacerbated when she is anxious or under stress but there has been no evidence of heart disease, tinnitus, nausea, they have never been strokes, epistaxis, claudication.  Chief complaint according to patient : " I would like to sleep through the night "   Sleep habits are as follows: dinner time is between 5-8, recently earlier in this range. She does housework, Medical sales representative afterwards, she lives alone. She watches TV, she never falls asleep in front of TV- she goes to bed every night 10 o clock , and having sometimes difficulties falling asleep, but she sleeps within 30 minutes, she doesn't read and has no TV in bed.  The bedroom,  in which she sleeps alone,  is cool, quiet and dark.  She stays asleep until 2.30- 2.45 and wakes up  She uses the bathroom, but it was not the cause for her arousal. It's  always at this time- and she  feels energized until about 5 AM , when she can go back to sleep until 7.30 when she goes to the gym. She is participating in a "boot camp".  Several years ago she did not have the problem of the long wakefullnes period. It entrained over years.  She feels menopause contributed by hot flushes and nocturia, and she feels she rests better after an exercise rich days. She sleeps on  1-2 pillows, she sleeps on her side, sometimes in supine.  She wakes up with a dry mouth.    Sleep medical history and family sleep history:  Sister has OSA, on CPAP.   Social history: retired - Mining engineer- o- lay for 24 years, retired from [employer] at [place] [employer] for 14 years.   2 adult children , 4 grandchildren. Non smoker, social drinker- on [place]. Caffeine- 2 coffee in AM-, iced tea when eating out.     Review of Systems: Out of a complete 14 system review, the  patient complains of only the following symptoms, and all other reviewed systems are negative.  snoring, interrupted sleep.   Epworth score 5 , now 2 points on CPAP , Fatigue severity score now 25 from 27 points , depression score 1/ 15. No clinical depression.  No longer nocturia.  Her daughter visited over the holiday and heard no snoring.   Social History   Socioeconomic History  . Marital status: Single    Spouse name: Not on file  . Number of children: Not on file  . Years of education: Not on file  . Highest education level: Not on file  Occupational History  . Occupation: retired  Science writer  Needs  . Financial resource strain: Not hard at all  . Food insecurity:    Worry: Never true    Inability: Never true  . Transportation needs:    Medical: No    Non-medical: No  Tobacco Use  . Smoking status: Never Smoker  . Smokeless tobacco: Never Used  Substance and Sexual Activity  . Alcohol use: Yes    Comment: socially  . Drug use: No  . Sexual activity: Not Currently  Lifestyle  . Physical activity:    Days per week: 3 days    Minutes per session: 60 min  . Stress: Not at all  Relationships  . Social connections:    Talks on phone: Not on file    Gets together: Not on file    Attends religious service: Not on file    Active member of club or organization: Not on file    Attends meetings of clubs or organizations: Not on file    Relationship status: Not on file  . Intimate partner violence:    Fear of current or ex partner: No    Emotionally abused: No    Physically abused: No    Forced sexual activity: No  Other Topics Concern  . Not on file  Social History Narrative  . Not on file    Family History  Problem Relation Age of Onset  . Cancer Mother   . Hypertension Mother   . Cancer Maternal Grandmother   . Hypertension Maternal Grandmother     Past Medical History:  Diagnosis Date  . Diabetes mellitus without complication ([place])   . GERD (gastroesophageal  reflux disease)   . Headache(784.0)   . High cholesterol   . Hypertension   . Sleep apnea    mild- no CPAP  . Stroke [facility]) 2005   mini- stroke due to HTN    Past Surgical History:  Procedure Laterality Date  . CHEST TUBE INSERTION  2004   lung punctured during shoulder surgery  . FOOT SURGERY     bilateral foot surgery  . HYSTEROSCOPY W/D&C N/A 02/02/2013   Procedure: DILATATION AND CURETTAGE /HYSTEROSCOPY;  Surgeon: Thurnell Lose, MD;  Location: [facility];  Service: Gynecology;  Laterality: N/A;  . KNEE ARTHROSCOPY    . SHOULDER ARTHROSCOPY    . TUBAL LIGATION      Current Outpatient Medications  Medication Sig Dispense Refill  . Cholecalciferol (VITAMIN D) 2000 UNITS tablet Take 2,000 Units by mouth daily.     Marland Kitchen latanoprost (XALATAN) 0.005 % ophthalmic solution INSTILL 1 DROP INTO OU HS    . levocetirizine (XYZAL) 5 MG tablet Take 5 mg by mouth daily as needed for allergies.    . metFORMIN (GLUCOPHAGE-XR) 750 MG 24 hr tablet TAKE 1 TABLET BY MOUTH  EVERY DAY WITH EVENING MEAL 90 tablet 1  . Multiple Vitamin (MULTIVITAMIN WITH MINERALS) TABS tablet Take 1 tablet by mouth daily.    . Olmesartan-amLODIPine-HCTZ 40-5-12.5 MG TABS TAKE 1 TABLET BY MOUTH  EVERY DAY 90 tablet 1  . OZEMPIC 0.25 or 0.5 MG/DOSE SOPN     . Triamcinolone Acetonide (NASACORT AQ NA) Place into the nose.     No current facility-administered medications for this visit.     Allergies as of 07/30/2018 - Review Complete 07/30/2018  Allergen Reaction Noted  . Codeine Nausea And Vomiting 01/28/2013    Vitals: Ht 5\' 6"  (1.676 m)   Wt 227 lb (103 kg)   LMP 01/23/2013   BMI 36.64 kg/m  Last Weight:   Wt Readings from Last 1 Encounters:  07/30/18 227 lb (103 kg)   TGG:YIRS mass index is 36.64 kg/m.     Last Height:   Ht Readings from Last 1 Encounters:  07/30/18 5\' 6"  (1.676 m)    Physical exam:  General: The patient is awake, alert and appears not in acute distress. The patient is well  groomed. Head: Normocephalic, atraumatic. Neck is supple. Mallampati 3,  Neck circumference:14. 75 - reduced. Nasal airflow patent , Cardiovascular:  Regular rate and rhythm, without  murmurs or carotid bruit, and without distended neck veins. Respiratory: Lungs are clear to auscultation. Skin:  Only mild ankle edema, or rash. Trunk: BMI is 39.22 . The patient's posture is erect   Neurologic exam : The patient is awake and alert, oriented to place and time.   Memory subjective described as intact.  Attention span & concentration ability appears normal.  Speech is fluent,  without dysarthria, dysphonia or aphasia.  Mood and affect are appropriate.  Cranial nerves: inatact smell and taste. Pupils are equal and briskly reactive to light. Funduscopic exam deferred . Extraocular movements  in vertical and horizontal planes intact and without nystagmus. Visual fields deferred.Hearing to finger rub intact. Facial sensation intact to fine touch.  Facial motor strength is symmetric and tongue and uvula move midline. Shoulder shrug was symmetrical.  Motor exam:  Normal tone, muscle bulk and symmetric strength in all extremities. Sensory:  Fine touch, pinprick and vibration were tested in all extremities. Proprioception in the upper extremities was normal. Coordination: Rapid alternating movements in the fingers/hands was normal. Finger-to-nose maneuver  normal without evidence of ataxia, dysmetria or tremor. Gait and station: Patient walks without assistive device  Stance is stable and normal. Deep tendon reflexes: in the upper and lower extremities are symmetric and intact.    Assessment:  After physical and neurologic examination, review of laboratory studies,  Personal review of imaging studies, reports of other /same  Imaging studies, results of polysomnography and / or neurophysiology testing and pre-existing records as far as provided in visit., my assessment is   1)  Mrs. Rinehart is snoring as  witnessed by family members , nobody has told her she stops breathing. She has milder risk factors for OSA, BMI is elevated, but Mallampati and neck size are normal, she doesn't present with retrognathia or poor dental status- all biological teeth.   2) she has one early morning arousal- spontaneously- like an inner clock waking her. This is not unusual, has been a pattern of healthy sleep - but she likes to addres this.  I will order a Gantt directed  In lab study.   The patient was advised of the nature of the diagnosed disorder , the treatment options and the risks for general health and wellness arising from not treating the condition. She clearly indicated benefit from CPAP, reflected in her ROS changes.   I spent more than 15 minutes of face to face time with the patient.  Greater than 50% of time was spent in counseling and coordination of care. We have discussed the diagnosis and differential and I answered the patient's questions.    Plan:  Treatment plan and additional workup :  Continue CPAP unless you feel aerophagia- the sensation of swallowing air.  Congratulations on your weight loss.  If your BMI is below 30 , we will retest  you if apnea may have resolved.  RV in 12 month.  Larey Seat, MD  03/24/300, 4:99 PM  Certified in Neurology by ABPN Certified in [place] by [facility] [address], [place], [place] [address]

## 2018-07-30 NOTE — Patient Instructions (Signed)

## 2018-09-02 ENCOUNTER — Other Ambulatory Visit: Payer: Self-pay | Admitting: Internal Medicine

## 2018-09-10 ENCOUNTER — Other Ambulatory Visit: Payer: Self-pay | Admitting: Internal Medicine

## 2018-10-01 ENCOUNTER — Ambulatory Visit: Payer: Medicare Other | Admitting: Internal Medicine

## 2018-10-14 ENCOUNTER — Other Ambulatory Visit: Payer: Self-pay

## 2018-10-14 ENCOUNTER — Encounter: Payer: Self-pay | Admitting: Internal Medicine

## 2018-10-14 ENCOUNTER — Ambulatory Visit: Payer: Medicare Other | Admitting: Internal Medicine

## 2018-10-14 VITALS — BP 112/86 | HR 62 | Temp 97.2°F | Ht 65.4 in | Wt 230.4 lb

## 2018-10-14 DIAGNOSIS — Z6837 Body mass index (BMI) 37.0-37.9, adult: Secondary | ICD-10-CM

## 2018-10-14 DIAGNOSIS — E1165 Type 2 diabetes mellitus with hyperglycemia: Secondary | ICD-10-CM | POA: Diagnosis not present

## 2018-10-14 DIAGNOSIS — I10 Essential (primary) hypertension: Secondary | ICD-10-CM | POA: Diagnosis not present

## 2018-10-14 LAB — POCT URINALYSIS DIPSTICK
Bilirubin, UA: NEGATIVE
Blood, UA: NEGATIVE
Glucose, UA: NEGATIVE
KETONES UA: NEGATIVE
Leukocytes, UA: NEGATIVE
Nitrite, UA: NEGATIVE
Protein, UA: NEGATIVE
SPEC GRAV UA: 1.025 (ref 1.010–1.025)
Urobilinogen, UA: 0.2 E.U./dL
pH, UA: 5.5 (ref 5.0–8.0)

## 2018-10-14 LAB — POCT UA - MICROALBUMIN
Albumin/Creatinine Ratio, Urine, POC: <30
Creatinine, POC: 200 mg/dL
MICROALBUMIN (UR) POC: 10 mg/L

## 2018-10-14 NOTE — Patient Instructions (Signed)

## 2018-10-14 NOTE — Progress Notes (Signed)
Subjective:     Patient ID: [name] , female    DOB: 1950/07/20 , 68 y.o.   MRN: [id number]   Chief Complaint  Patient presents with  . Hypertension  . Diabetes    HPI  Hypertension  This is a chronic problem. The current episode started more than 1 year ago. The problem has been gradually improving since onset. The problem is controlled. Pertinent negatives include no blurred vision, chest pain, palpitations or shortness of breath. Risk factors for coronary artery disease include diabetes mellitus, dyslipidemia, obesity and post-menopausal state. The current treatment provides moderate improvement. Compliance problems include exercise.   Diabetes  She presents for her follow-up diabetic visit. She has type 2 diabetes mellitus. Her disease course has been stable. There are no hypoglycemic associated symptoms. Pertinent negatives for diabetes include no blurred vision and no chest pain. There are no hypoglycemic complications. Her breakfast blood glucose is taken between 8-9 am. Her breakfast blood glucose range is generally 70-90 mg/dl.     Past Medical History:  Diagnosis Date  . Diabetes mellitus without complication ([place])   . GERD (gastroesophageal reflux disease)   . Headache(784.0)   . High cholesterol   . Hypertension   . Sleep apnea    mild- no CPAP  . Stroke [facility]) 2005   mini- stroke due to HTN     Family History  Problem Relation Age of Onset  . Cancer Mother   . Hypertension Mother   . Cancer Maternal Grandmother   . Hypertension Maternal Grandmother      Current Outpatient Medications:  .  Cholecalciferol (VITAMIN D) 2000 UNITS tablet, Take 2,000 Units by mouth daily. , Disp: , Rfl:  .  latanoprost (XALATAN) 0.005 % ophthalmic solution, INSTILL 1 DROP INTO OU HS, Disp: , Rfl:  .  levocetirizine (XYZAL) 5 MG tablet, Take 5 mg by mouth daily as needed for allergies., Disp: , Rfl:  .  metFORMIN (GLUCOPHAGE-XR) 750 MG 24 hr tablet, TAKE 1 TABLET BY MOUTH   EVERY DAY WITH EVENING MEAL, Disp: 90 tablet, Rfl: 1 .  Multiple Vitamin (MULTIVITAMIN WITH MINERALS) TABS tablet, Take 1 tablet by mouth daily., Disp: , Rfl:  .  Olmesartan-amLODIPine-HCTZ 40-5-12.5 MG TABS, TAKE 1 TABLET BY MOUTH  EVERY DAY, Disp: 90 tablet, Rfl: 1 .  ONETOUCH VERIO test strip, USE AS DIRECTED TO CHECK  BLOOD SUGAR ONCE A DAY, Disp: 100 each, Rfl: 1 .  OZEMPIC, 0.25 OR 0.5 MG/DOSE, 2 MG/1.5ML SOPN, INJECT (0.5MG)  SUBCUTANEOUSLY 1 TIME  WEEKLY IN THE ABDOMEN,  THIGHS OR UPPER ARM,  ROTATING INJECTION SITES, Disp: 6 pen, Rfl: 2 .  Triamcinolone Acetonide (NASACORT AQ NA), Place into the nose., Disp: , Rfl:    Allergies  Allergen Reactions  . Codeine Nausea And Vomiting     Review of Systems  Constitutional: Negative.   Eyes: Negative for blurred vision.  Respiratory: Negative.  Negative for shortness of breath.   Cardiovascular: Negative.  Negative for chest pain and palpitations.  Gastrointestinal: Negative.   Neurological: Negative.   Psychiatric/Behavioral: Negative.      Today's Vitals   10/14/18 1407  BP: 112/86  Pulse: 62  Temp: (!) 97.2 F (36.2 C)  TempSrc: Oral  Weight: 230 lb 6.4 oz (104.5 kg)  Height: 5' 5.4" (1.661 m)  PainSc: 7   PainLoc: Head   Body mass index is 37.87 kg/m.   Objective:  Physical Exam Vitals signs and nursing note reviewed.  Constitutional:  Appearance: Normal appearance.  HENT:     Head: Normocephalic and atraumatic.  Cardiovascular:     Rate and Rhythm: Normal rate and regular rhythm.     Heart sounds: Normal heart sounds.  Pulmonary:     Effort: Pulmonary effort is normal.     Breath sounds: Normal breath sounds.  Skin:    General: Skin is warm.  Neurological:     General: No focal deficit present.     Mental Status: She is alert.  Psychiatric:        Mood and Affect: Mood normal.        Behavior: Behavior normal.         Assessment And Plan:     1. Essential hypertension, benign  Well  controlled.  She reports compliance with meds. She is encouraged to avoid adding salt to her foods.   2. Type 2 diabetes mellitus with hyperglycemia, without long-term current use of insulin (HCC)  I will check u/a and microalbumin today. She is encouraged to resume her regular exercise regimen.   - CMP14+EGFR - Hemoglobin A1c - Lipid panel  3. Class 2 severe obesity due to excess calories with serious comorbidity and body mass index (BMI) of 37.0 to 37.9 in adult [facility])  Patient advised of 7 pound weight gain since Dec 2019. Importance of achieving optimal weight to decrease risk of cardiovascular disease and cancers was discussed with the patient in full detail. She is encouraged to start slowly - start with 10 minutes twice daily at least three to four days per week and to gradually build to 30 minutes five days weekly. She was given tips to incorporate more activity into her daily routine - take stairs when possible, park farther away from grocery stores, etc.      [name], MD

## 2018-10-15 LAB — CMP14+EGFR
ALBUMIN: 4.1 g/dL (ref 3.8–4.8)
ALT: 14 IU/L (ref 0–32)
AST: 18 IU/L (ref 0–40)
Albumin/Globulin Ratio: 1.5 (ref 1.2–2.2)
Alkaline Phosphatase: 93 IU/L (ref 39–117)
BUN/Creatinine Ratio: 22 (ref 12–28)
BUN: 15 mg/dL (ref 8–27)
Bilirubin Total: 0.3 mg/dL (ref 0.0–1.2)
CO2: 23 mmol/L (ref 20–29)
CREATININE: 0.67 mg/dL (ref 0.57–1.00)
Calcium: 9.5 mg/dL (ref 8.7–10.3)
Chloride: 103 mmol/L (ref 96–106)
GFR, EST AFRICAN AMERICAN: 105 mL/min/{1.73_m2} (ref >59)
GFR, EST NON AFRICAN AMERICAN: 91 mL/min/{1.73_m2} (ref >59)
GLOBULIN, TOTAL: 2.8 g/dL (ref 1.5–4.5)
GLUCOSE: 82 mg/dL (ref 65–99)
POTASSIUM: 4.4 mmol/L (ref 3.5–5.2)
SODIUM: 143 mmol/L (ref 134–144)
TOTAL PROTEIN: 6.9 g/dL (ref 6.0–8.5)

## 2018-10-15 LAB — HEMOGLOBIN A1C
ESTIMATED AVERAGE GLUCOSE: 114 mg/dL
Hgb A1c MFr Bld: 5.6 % (ref 4.8–5.6)

## 2018-10-15 LAB — LIPID PANEL
CHOLESTEROL TOTAL: 163 mg/dL (ref 100–199)
Chol/HDL Ratio: 2.8 ratio (ref 0.0–4.4)
HDL: 58 mg/dL (ref >39)
LDL CALC: 84 mg/dL (ref 0–99)
Triglycerides: 106 mg/dL (ref 0–149)
VLDL CHOLESTEROL CAL: 21 mg/dL (ref 5–40)

## 2018-11-06 ENCOUNTER — Other Ambulatory Visit: Payer: Self-pay | Admitting: Internal Medicine

## 2018-11-06 MED ORDER — AMOXICILLIN-POT CLAVULANATE 875-125 MG PO TABS: 1.0000 | ORAL_TABLET | Freq: Two times a day (BID) | ORAL | 0 refills | Status: AC

## 2018-12-02 ENCOUNTER — Other Ambulatory Visit: Payer: Self-pay | Admitting: Internal Medicine

## 2018-12-04 ENCOUNTER — Other Ambulatory Visit: Payer: Self-pay | Admitting: Internal Medicine

## 2018-12-16 ENCOUNTER — Other Ambulatory Visit: Payer: Self-pay

## 2018-12-16 MED ORDER — SEMAGLUTIDE(0.25 OR 0.5MG/DOS) 2 MG/1.5ML ~~LOC~~ SOPN: 1.5000 mL | PEN_INJECTOR | SUBCUTANEOUS | 5 refills | Status: DC

## 2018-12-23 ENCOUNTER — Telehealth: Payer: Self-pay

## 2018-12-23 ENCOUNTER — Other Ambulatory Visit: Payer: Self-pay

## 2018-12-23 MED ORDER — SEMAGLUTIDE(0.25 OR 0.5MG/DOS) 2 MG/1.5ML ~~LOC~~ SOPN: 0.5000 mg | PEN_INJECTOR | SUBCUTANEOUS | 2 refills | Status: DC

## 2018-12-23 NOTE — Telephone Encounter (Signed)
The pt verified that she is currently using 0.5 mg of Ozempic weekly.

## 2019-01-06 ENCOUNTER — Ambulatory Visit: Payer: Medicare Other

## 2019-01-06 ENCOUNTER — Other Ambulatory Visit: Payer: Self-pay

## 2019-01-06 VITALS — BP 130/74 | HR 74 | Temp 98.7°F | Ht 66.0 in | Wt 229.4 lb

## 2019-01-06 DIAGNOSIS — Z0000 Encounter for general adult medical examination without abnormal findings: Secondary | ICD-10-CM

## 2019-01-06 DIAGNOSIS — E1165 Type 2 diabetes mellitus with hyperglycemia: Secondary | ICD-10-CM

## 2019-01-06 LAB — POCT URINALYSIS DIPSTICK
Bilirubin, UA: NEGATIVE
Blood, UA: NEGATIVE
Glucose, UA: NEGATIVE
Ketones, UA: NEGATIVE
Nitrite, UA: NEGATIVE
Protein, UA: NEGATIVE
Spec Grav, UA: 1.025 (ref 1.010–1.025)
Urobilinogen, UA: 0.2 E.U./dL
pH, UA: 5 (ref 5.0–8.0)

## 2019-01-06 LAB — POCT UA - MICROALBUMIN
Albumin/Creatinine Ratio, Urine, POC: <30
Creatinine, POC: 300 mg/dL
Microalbumin Ur, POC: 30 mg/L

## 2019-01-06 NOTE — Patient Instructions (Signed)
[name] , Thank you for taking time to come for your Medicare Wellness Visit. I appreciate your ongoing commitment to your health goals. Please review the following plan we discussed and let me know if I can assist you in the future.   Screening recommendations/referrals: Colonoscopy: 10/2012 Mammogram: 04/2018 Bone Density: 05/2016 Recommended yearly ophthalmology/optometry visit for glaucoma screening and checkup Recommended yearly dental visit for hygiene and checkup  Vaccinations: Influenza vaccine: 04/2018 Pneumococcal vaccine: 04/2018 Tdap vaccine: 12/2017 Shingles vaccine: discussed    Advanced directives: Advance directive discussed with you today. Even though you declined this today please call our office should you change your mind and we can give you the proper paperwork for you to fill out.   Conditions/risks identified: obesity  Next appointment: 02/17/2019 at 2:30   Preventive Care 68 Years and Older, Female Preventive care refers to lifestyle choices and visits with your health care provider that can promote health and wellness. What does preventive care include?  A yearly physical exam. This is also called an annual well check.  Dental exams once or twice a year.  Routine eye exams. Ask your health care provider how often you should have your eyes checked.  Personal lifestyle choices, including:  Daily care of your teeth and gums.  Regular physical activity.  Eating a healthy diet.  Avoiding tobacco and drug use.  Limiting alcohol use.  Practicing safe sex.  Taking low-dose aspirin every day.  Taking vitamin and mineral supplements as recommended by your health care provider. What happens during an annual well check? The services and screenings done by your health care provider during your annual well check will depend on your age, overall health, lifestyle risk factors, and family history of disease. Counseling  Your health care provider may ask you  questions about your:  Alcohol use.  Tobacco use.  Drug use.  Emotional well-being.  Home and relationship well-being.  Sexual activity.  Eating habits.  History of falls.  Memory and ability to understand (cognition).  Work and work Statistician.  Reproductive health. Screening  You may have the following tests or measurements:  Height, weight, and BMI.  Blood pressure.  Lipid and cholesterol levels. These may be checked every 5 years, or more frequently if you are over 68 years old.  Skin check.  Lung cancer screening. You may have this screening every year starting at age 68 if you have a 30-pack-year history of smoking and currently smoke or have quit within the past 15 years.  Fecal occult blood test (FOBT) of the stool. You may have this test every year starting at age 68.  Flexible sigmoidoscopy or colonoscopy. You may have a sigmoidoscopy every 5 years or a colonoscopy every 10 years starting at age 68.  Hepatitis C blood test.  Hepatitis B blood test.  Sexually transmitted disease (STD) testing.  Diabetes screening. This is done by checking your blood sugar (glucose) after you have not eaten for a while (fasting). You may have this done every 1-3 years.  Bone density scan. This is done to screen for osteoporosis. You may have this done starting at age 68.  Mammogram. This may be done every 1-2 years. Talk to your health care provider about how often you should have regular mammograms. Talk with your health care provider about your test results, treatment options, and if necessary, the need for more tests. Vaccines  Your health care provider may recommend certain vaccines, such as:  Influenza vaccine. This is recommended every  year.  Tetanus, diphtheria, and acellular pertussis (Tdap, Td) vaccine. You may need a Td booster every 10 years.  Zoster vaccine. You may need this after age 68.  Pneumococcal 13-valent conjugate (PCV13) vaccine. One dose is  recommended after age 68.  Pneumococcal polysaccharide (PPSV23) vaccine. One dose is recommended after age 68. Talk to your health care provider about which screenings and vaccines you need and how often you need them. This information is not intended to replace advice given to you by your health care provider. Make sure you discuss any questions you have with your health care provider. Document Released: 07/29/2015 Document Revised: 03/21/2016 Document Reviewed: 05/03/2015 Elsevier Interactive Patient Education  2017 [place] Prevention in the Home Falls can cause injuries. They can happen to people of all ages. There are many things you can do to make your home safe and to help prevent falls. What can I do on the outside of my home?  Regularly fix the edges of walkways and driveways and fix any cracks.  Remove anything that might make you trip as you walk through a door, such as a raised step or threshold.  Trim any bushes or trees on the path to your home.  Use bright outdoor lighting.  Clear any walking paths of anything that might make someone trip, such as rocks or tools.  Regularly check to see if handrails are loose or broken. Make sure that both sides of any steps have handrails.  Any raised decks and porches should have guardrails on the edges.  Have any leaves, snow, or ice cleared regularly.  Use sand or salt on walking paths during winter.  Clean up any spills in your garage right away. This includes oil or grease spills. What can I do in the bathroom?  Use night lights.  Install grab bars by the toilet and in the tub and shower. Do not use towel bars as grab bars.  Use non-skid mats or decals in the tub or shower.  If you need to sit down in the shower, use a plastic, non-slip stool.  Keep the floor dry. Clean up any water that spills on the floor as soon as it happens.  Remove soap buildup in the tub or shower regularly.  Attach bath mats  securely with double-sided non-slip rug tape.  Do not have throw rugs and other things on the floor that can make you trip. What can I do in the bedroom?  Use night lights.  Make sure that you have a light by your bed that is easy to reach.  Do not use any sheets or blankets that are too big for your bed. They should not hang down onto the floor.  Have a firm chair that has side arms. You can use this for support while you get dressed.  Do not have throw rugs and other things on the floor that can make you trip. What can I do in the kitchen?  Clean up any spills right away.  Avoid walking on wet floors.  Keep items that you use a lot in easy-to-reach places.  If you need to reach something above you, use a strong step stool that has a grab bar.  Keep electrical cords out of the way.  Do not use floor polish or wax that makes floors slippery. If you must use wax, use non-skid floor wax.  Do not have throw rugs and other things on the floor that can make you trip. What can  I do with my stairs?  Do not leave any items on the stairs.  Make sure that there are handrails on both sides of the stairs and use them. Fix handrails that are broken or loose. Make sure that handrails are as long as the stairways.  Check any carpeting to make sure that it is firmly attached to the stairs. Fix any carpet that is loose or worn.  Avoid having throw rugs at the top or bottom of the stairs. If you do have throw rugs, attach them to the floor with carpet tape.  Make sure that you have a light switch at the top of the stairs and the bottom of the stairs. If you do not have them, ask someone to add them for you. What else can I do to help prevent falls?  Wear shoes that:  Do not have high heels.  Have rubber bottoms.  Are comfortable and fit you well.  Are closed at the toe. Do not wear sandals.  If you use a stepladder:  Make sure that it is fully opened. Do not climb a closed  stepladder.  Make sure that both sides of the stepladder are locked into place.  Ask someone to hold it for you, if possible.  Clearly mark and make sure that you can see:  Any grab bars or handrails.  First and last steps.  Where the edge of each step is.  Use tools that help you move around (mobility aids) if they are needed. These include:  Canes.  Walkers.  Scooters.  Crutches.  Turn on the lights when you go into a dark area. Replace any light bulbs as soon as they burn out.  Set up your furniture so you have a clear path. Avoid moving your furniture around.  If any of your floors are uneven, fix them.  If there are any pets around you, be aware of where they are.  Review your medicines with your doctor. Some medicines can make you feel dizzy. This can increase your chance of falling. Ask your doctor what other things that you can do to help prevent falls. This information is not intended to replace advice given to you by your health care provider. Make sure you discuss any questions you have with your health care provider. Document Released: 04/28/2009 Document Revised: 12/08/2015 Document Reviewed: 08/06/2014 Elsevier Interactive Patient Education  2017 [employer].

## 2019-01-06 NOTE — Progress Notes (Signed)
Subjective:   [name] is a 68 y.o. female who presents for Medicare Annual (Subsequent) preventive examination.  Review of Systems:  n/a Cardiac Risk Factors include: advanced age (>66men, >33 women);diabetes mellitus;hypertension;sedentary lifestyle;obesity (BMI >30kg/m2)     Objective:     Vitals: BP 130/74 (BP Location: Left Arm, Patient Position: Sitting, Cuff Size: Large)   Pulse 74   Temp 98.7 F (37.1 C) (Oral)   Ht 5\' 6"  (1.676 m)   Wt 229 lb 6.4 oz (104.1 kg)   LMP 01/23/2013   SpO2 96%   BMI 37.03 kg/m   Body mass index is 37.03 kg/m.  Advanced Directives 01/06/2019 04/23/2018 02/02/2013 01/30/2013  Does Patient Have a Medical Advance Directive? No No Patient does not have advance directive Patient does not have advance directive  Would patient like information on creating a medical advance directive? No - Patient declined Yes (MAU/Ambulatory/Procedural Areas - Information given) - -    Tobacco Social History   Tobacco Use  Smoking Status Never Smoker  Smokeless Tobacco Never Used     Counseling given: Not Answered   Clinical Intake:  Pre-visit preparation completed: Yes  Pain : No/denies pain Pain Score: 0-No pain     Nutritional Status: BMI > 30  Obese Nutritional Risks: None Diabetes: Yes CBG done?: No Did pt. bring in CBG monitor from home?: No  How often do you need to have someone help you when you read instructions, pamphlets, or other written materials from your doctor or pharmacy?: 1 - Never What is the last grade level you completed in school?: some college  Interpreter Needed?: No  Information entered by :: NAllen LPN  Past Medical History:  Diagnosis Date  . Diabetes mellitus without complication ([place])   . GERD (gastroesophageal reflux disease)   . Headache(784.0)   . High cholesterol   . Hypertension   . Sleep apnea    mild- no CPAP  . Stroke [facility]) 2005   mini- stroke due to HTN   Past Surgical History:  Procedure  Laterality Date  . CHEST TUBE INSERTION  2004   lung punctured during shoulder surgery  . FOOT SURGERY     bilateral foot surgery  . HYSTEROSCOPY W/D&C N/A 02/02/2013   Procedure: DILATATION AND CURETTAGE /HYSTEROSCOPY;  Surgeon: Thurnell Lose, MD;  Location: [facility];  Service: Gynecology;  Laterality: N/A;  . KNEE ARTHROSCOPY    . SHOULDER ARTHROSCOPY    . TUBAL LIGATION     Family History  Problem Relation Age of Onset  . Cancer Mother   . Hypertension Mother   . Cancer Maternal Grandmother   . Hypertension Maternal Grandmother    Social History   Socioeconomic History  . Marital status: Single    Spouse name: Not on file  . Number of children: Not on file  . Years of education: Not on file  . Highest education level: Not on file  Occupational History  . Occupation: retired  Scientific laboratory technician  . Financial resource strain: Not hard at all  . Food insecurity    Worry: Never true    Inability: Never true  . Transportation needs    Medical: No    Non-medical: No  Tobacco Use  . Smoking status: Never Smoker  . Smokeless tobacco: Never Used  Substance and Sexual Activity  . Alcohol use: Yes    Comment: socially  . Drug use: No  . Sexual activity: Not Currently  Lifestyle  . Physical activity  Days per week: 0 days    Minutes per session: 0 min  . Stress: Not at all  Relationships  . Social Herbalist on phone: Not on file    Gets together: Not on file    Attends religious service: Not on file    Active member of club or organization: Not on file    Attends meetings of clubs or organizations: Not on file    Relationship status: Not on file  Other Topics Concern  . Not on file  Social History Narrative  . Not on file    Outpatient Encounter Medications as of 01/06/2019  Medication Sig  . Cholecalciferol (VITAMIN D) 2000 UNITS tablet Take 2,000 Units by mouth daily.   Marland Kitchen latanoprost (XALATAN) 0.005 % ophthalmic solution INSTILL 1 DROP INTO OU HS  .  levocetirizine (XYZAL) 5 MG tablet Take 5 mg by mouth daily as needed for allergies.  . metFORMIN (GLUCOPHAGE-XR) 750 MG 24 hr tablet TAKE 1 TABLET BY MOUTH  EVERY DAY WITH EVENING MEAL  . Multiple Vitamin (MULTIVITAMIN WITH MINERALS) TABS tablet Take 1 tablet by mouth daily.  . Olmesartan-amLODIPine-HCTZ 40-5-12.5 MG TABS TAKE 1 TABLET BY MOUTH  EVERY DAY  . ONETOUCH VERIO test strip USE AS DIRECTED TO CHECK  BLOOD SUGAR ONCE A DAY  . Semaglutide,0.25 or 0.5MG /DOS, (OZEMPIC, 0.25 OR 0.5 MG/DOSE,) 2 MG/1.5ML SOPN Inject 0.5 mg into the skin once a week.  . Triamcinolone Acetonide (NASACORT AQ NA) Place into the nose.   No facility-administered encounter medications on file as of 01/06/2019.     Activities of Daily Living In your present state of health, do you have any difficulty performing the following activities: 01/06/2019 04/23/2018  Hearing? N N  Vision? N N  Difficulty concentrating or making decisions? N N  Walking or climbing stairs? N N  Dressing or bathing? N N  Doing errands, shopping? N N  Preparing Food and eating ? N N  Using the Toilet? N N  In the past six months, have you accidently leaked urine? N N  Do you have problems with loss of bowel control? N N  Managing your Medications? N N  Managing your Finances? N N  Housekeeping or managing your Housekeeping? N N  Some recent data might be hidden    Patient Care Team: Glendale Chard, MD as PCP - General (Internal Medicine) Warden Fillers, MD as Consulting Physician (Ophthalmology)    Assessment:   This is a routine wellness examination for [name].  Exercise Activities and Dietary recommendations Current Exercise Habits: The patient does not participate in regular exercise at present  Goals    . DIET - REDUCE CALORIE INTAKE (pt-stated)     Patient is cutting down on breads, increasing water intake.    . Weight (lb) < 200 lb (90.7 kg) (pt-stated)       Fall Risk Fall Risk  01/06/2019 10/14/2018 07/30/2018  07/02/2018 06/02/2018  Falls in the past year? 0 0 0 0 0  Risk for fall due to : Medication side effect - - - -  Follow up Education provided;Falls prevention discussed - - - -   Is the patient's home free of loose throw rugs in walkways, pet beds, electrical cords, etc?   yes      Grab bars in the bathroom? yes      Handrails on the stairs?   yes      Adequate lighting?   yes  Timed Get Up and  Go performed: n/a  Depression Screen PHQ 2/9 Scores 01/06/2019 10/14/2018 07/02/2018 06/02/2018  PHQ - 2 Score 0 0 0 0  PHQ- 9 Score 0 - - -     Cognitive Function     6CIT Screen 01/06/2019 04/23/2018  What Year? 0 points 0 points  What month? 0 points 0 points  What time? 0 points 0 points  Count back from 20 0 points 0 points  Months in reverse 0 points 0 points  Repeat phrase 0 points 0 points  Total Score 0 0    Immunization History  Administered Date(s) Administered  . Influenza, High Dose Seasonal PF 04/23/2018  . Pneumococcal Polysaccharide-23 04/23/2018  . Tdap 12/26/2017    Qualifies for Shingles Vaccine? yes  Screening Tests Health Maintenance  Topic Date Due  . INFLUENZA VACCINE  02/14/2019  . PNA vac Low Risk Adult (2 of 2 - PCV13) 04/24/2019  . MAMMOGRAM  05/06/2020  . COLONOSCOPY  10/30/2022  . TETANUS/TDAP  12/27/2027  . DEXA SCAN  Completed  . Hepatitis C Screening  Completed    Cancer Screenings: Lung: Low Dose CT Chest recommended if Age 2-80 years, 30 pack-year currently smoking OR have quit w/in 15years. Patient does not qualify. Breast:  Up to date on Mammogram? Yes   Up to date of Bone Density/Dexa? Yes Colorectal: up to date  Additional Screenings: : Hepatitis C Screening: 08/30/2012     Plan:   6 CIT was normal. Wants to get under 200 pounds  I have personally reviewed and noted the following in the patient's chart:   . Medical and social history . Use of alcohol, tobacco or illicit drugs  . Current medications and supplements .  Functional ability and status . Nutritional status . Physical activity . Advanced directives . List of other physicians . Hospitalizations, surgeries, and ER visits in previous 12 months . Vitals . Screenings to include cognitive, depression, and falls . Referrals and appointments  In addition, I have reviewed and discussed with patient certain preventive protocols, quality metrics, and best practice recommendations. A written personalized care plan for preventive services as well as general preventive health recommendations were provided to patient.     Kellie Simmering, LPN  [id number]

## 2019-02-17 ENCOUNTER — Ambulatory Visit: Payer: Medicare Other | Admitting: Internal Medicine

## 2019-02-17 ENCOUNTER — Encounter: Payer: Self-pay | Admitting: Internal Medicine

## 2019-02-17 ENCOUNTER — Other Ambulatory Visit: Payer: Self-pay

## 2019-02-17 VITALS — BP 116/82 | HR 66 | Temp 98.1°F | Ht 66.0 in | Wt 231.6 lb

## 2019-02-17 DIAGNOSIS — I10 Essential (primary) hypertension: Secondary | ICD-10-CM | POA: Diagnosis not present

## 2019-02-17 DIAGNOSIS — E1165 Type 2 diabetes mellitus with hyperglycemia: Secondary | ICD-10-CM

## 2019-02-17 DIAGNOSIS — F4321 Adjustment disorder with depressed mood: Secondary | ICD-10-CM | POA: Diagnosis not present

## 2019-02-17 DIAGNOSIS — Z6837 Body mass index (BMI) 37.0-37.9, adult: Secondary | ICD-10-CM

## 2019-02-17 NOTE — Progress Notes (Signed)
Subjective:     Patient ID: [name] , female    DOB: Nov 08, 1950 , 68 y.o.   MRN: [id number]   Chief Complaint  Patient presents with  . Diabetes  . Hypertension    HPI  Diabetes She presents for her follow-up diabetic visit. She has type 2 diabetes mellitus. Her disease course has been improving. There are no hypoglycemic associated symptoms. There are no diabetic associated symptoms. Pertinent negatives for diabetes include no blurred vision and no chest pain. There are no hypoglycemic complications. There are no diabetic complications. Risk factors for coronary artery disease include diabetes mellitus, hypertension, obesity and post-menopausal. She is compliant with treatment most of the time. She is following a generally healthy diet. She participates in exercise intermittently. Her breakfast blood glucose is taken between 8-9 am. Her breakfast blood glucose range is generally 70-90 mg/dl. Eye exam is current.  Hypertension This is a chronic problem. The current episode started more than 1 year ago. The problem is unchanged. The problem is controlled. Pertinent negatives include no blurred vision, chest pain, neck pain, orthopnea, palpitations, PND or shortness of breath. The current treatment provides moderate improvement.     Past Medical History:  Diagnosis Date  . Diabetes mellitus without complication ([place])   . GERD (gastroesophageal reflux disease)   . Headache(784.0)   . High cholesterol   . Hypertension   . Sleep apnea    mild- no CPAP  . Stroke [facility]) 2005   mini- stroke due to HTN     Family History  Problem Relation Age of Onset  . Cancer Mother   . Hypertension Mother   . Cancer Maternal Grandmother   . Hypertension Maternal Grandmother      Current Outpatient Medications:  .  Cholecalciferol (VITAMIN D) 2000 UNITS tablet, Take 2,000 Units by mouth daily. , Disp: , Rfl:  .  latanoprost (XALATAN) 0.005 % ophthalmic solution, INSTILL 1 DROP INTO OU HS,  Disp: , Rfl:  .  levocetirizine (XYZAL) 5 MG tablet, Take 5 mg by mouth daily as needed for allergies., Disp: , Rfl:  .  metFORMIN (GLUCOPHAGE-XR) 750 MG 24 hr tablet, TAKE 1 TABLET BY MOUTH  EVERY DAY WITH EVENING MEAL, Disp: 90 tablet, Rfl: 1 .  Multiple Vitamin (MULTIVITAMIN WITH MINERALS) TABS tablet, Take 1 tablet by mouth daily., Disp: , Rfl:  .  Olmesartan-amLODIPine-HCTZ 40-5-12.5 MG TABS, TAKE 1 TABLET BY MOUTH  EVERY DAY, Disp: 90 tablet, Rfl: 1 .  ONETOUCH VERIO test strip, USE AS DIRECTED TO CHECK  BLOOD SUGAR ONCE A DAY, Disp: 100 each, Rfl: 1 .  Semaglutide,0.25 or 0.5MG/DOS, (OZEMPIC, 0.25 OR 0.5 MG/DOSE,) 2 MG/1.5ML SOPN, Inject 0.5 mg into the skin once a week., Disp: 3 pen, Rfl: 2 .  Triamcinolone Acetonide (NASACORT AQ NA), Place into the nose., Disp: , Rfl:    Allergies  Allergen Reactions  . Codeine Nausea And Vomiting     Review of Systems  Constitutional: Negative.   Eyes: Negative for blurred vision.  Respiratory: Negative.  Negative for shortness of breath.   Cardiovascular: Negative.  Negative for chest pain, palpitations, orthopnea and PND.  Gastrointestinal: Negative.   Musculoskeletal: Negative for neck pain.  Neurological: Negative.   Psychiatric/Behavioral: Positive for dysphoric mood.       She is still grieving the loss of her sister. Unfortunately, she has yet to go to hospice for grief counseling. She reports she feels like she may go soon.      Today's  Vitals   02/17/19 1430  BP: 116/82  Pulse: 66  Temp: 98.1 F (36.7 C)  TempSrc: Oral  Weight: 231 lb 9.6 oz (105.1 kg)  Height: 5' 6"  (1.676 m)  PainSc: 0-No pain   Body mass index is 37.38 kg/m.   Objective:  Physical Exam Vitals signs and nursing note reviewed.  Constitutional:      Appearance: Normal appearance.  HENT:     Head: Normocephalic and atraumatic.  Cardiovascular:     Rate and Rhythm: Normal rate and regular rhythm.     Heart sounds: Normal heart sounds.  Pulmonary:      Effort: Pulmonary effort is normal.     Breath sounds: Normal breath sounds.  Skin:    General: Skin is warm.  Neurological:     General: No focal deficit present.     Mental Status: She is alert.  Psychiatric:        Mood and Affect: Mood normal.        Behavior: Behavior normal.         Assessment And Plan:     1. Type 2 diabetes mellitus with hyperglycemia, without long-term current use of insulin ([place])  I will check labs as listed below. Importance of regular exercise was discussed with the patient. She is encouraged to exercise no less than 150 minutes per week.   - BMP8+EGFR - Hemoglobin A1c  2. Essential hypertension, benign  Well controlled. She will continue with current meds.  She is encouraged to avoid adding salt to her foods.   3. Grief reaction  She is encouraged to go to bed  4. Class 2 severe obesity due to excess calories with serious comorbidity and body mass index (BMI) of 37.0 to 37.9 in adult [facility])  Importance of achieving optimal weight to decrease risk of cardiovascular disease and cancers was discussed with the patient in full detail. She is encouraged to start slowly - start with 10 minutes twice daily at least three to four days per week and to gradually build to 30 minutes five days weekly. She was given tips to incorporate more activity into her daily routine - take stairs when possible, park farther away from grocery stores, etc.    [name], MD    THE PATIENT IS ENCOURAGED TO PRACTICE SOCIAL DISTANCING DUE TO THE COVID-19 PANDEMIC.

## 2019-02-17 NOTE — Patient Instructions (Signed)
Complicated Grief Grief is a normal response to the death of someone close to you. Feelings of fear, anger, and guilt can affect almost everyone who loses a loved one. It is also common to have symptoms of depression while you are grieving. These include problems with sleep, loss of appetite, and lack of energy. They may last for weeks or months after a loss. Complicated grief is different from normal grief or depression. Normal grieving involves sadness and feelings of loss, but those feelings get better and heal over time. Complicated grief is a severe type of grief that lasts for a long time, usually for several months to a year or longer. It interferes with your ability to function normally. Complicated grief may require treatment from a mental health care provider. What are the causes? The cause of this condition is not known. It is not clear why some people continue to struggle with grief and others do not. What increases the risk? You are more likely to develop this condition if:  The death of your loved one was sudden or unexpected.  The death of your loved one was due to a violent event.  Your loved one died from suicide.  Your loved one was a child or a young person.  You were very close to your loved one, or you were dependent on him or her.  You have a history of depression or anxiety. What are the signs or symptoms? Symptoms of this condition include:  Feeling disbelief or having a lack of emotion (numbness).  Being unable to enjoy good memories of your loved one.  Needing to avoid anything or anyone that reminds you of your loved one.  Being unable to stop thinking about the death.  Feeling intense anger or guilt.  Feeling alone and hopeless.  Feeling that your life is meaningless and empty.  Losing the desire to move on with your life. How is this diagnosed? This condition may be diagnosed based on:  Your symptoms. Complicated grief will be diagnosed if you have  ongoing symptoms of grief for 6-12 months or longer.  The effect of symptoms on your life. You may be diagnosed with this condition if your symptoms are interfering with your ability to live your life. Your health care provider may recommend that you see a mental health care provider. Many symptoms of depression are similar to the symptoms of complicated grief. It is important to be evaluated for complicated grief along with other mental health conditions. How is this treated? This condition is most commonly treated with talk therapy. This therapy is offered by a mental health specialist (psychiatrist). During therapy:  You will learn healthy ways to cope with the loss of your loved one.  Your mental health care provider may recommend antidepressant medicines. Follow these instructions at home: Lifestyle   Take care of yourself. ? Eat on a regular basis, and maintain a healthy diet. Eat plenty of fruits, vegetables, lean protein, and whole grains. ? Try to get some exercise each day. Aim for 30 minutes of exercise on most days of the week. ? Keep a consistent sleep schedule. Try to get 8 or more hours of sleep each night. ? Start doing the things that you used to enjoy.  Do not use drugs or alcohol to ease your symptoms.  Spend time with friends and loved ones. General instructions  Take over-the-counter and prescription medicines only as told by your health care provider.  Consider joining a grief (bereavement) support group   to help you deal with your loss.  Keep all follow-up visits as told by your health care provider. This is important. Contact a health care provider if:  Your symptoms prevent you from functioning normally.  Your symptoms do not get better with treatment. Get help right away if:  You have serious thoughts about hurting yourself or someone else.  You have suicidal feelings. If you ever feel like you may hurt yourself or others, or have thoughts about taking  your own life, get help right away. You can go to your nearest emergency department or call:  Your local emergency services (911 in the [place]  A suicide crisis helpline, such as the National Suicide Prevention Lifeline at 1-800-273-8255. This is open 24 hours a day. Summary  Complicated grief is a severe type of grief that lasts for a long time. This grief is not likely to go away on its own. Get the help you need.  Some griefs are more difficult than others and can cause this condition. You may need a certain type of treatment to help you recover if the loss of your loved one was sudden, violent, or due to suicide.  You may feel guilty about moving on with your life. Getting help does not mean that you are forgetting your loved one. It means that you are taking care of yourself.  Complicated grief is best treated with talk therapy. Medicines may also be prescribed.  Seek the help you need, and find support that will help you recover. This information is not intended to replace advice given to you by your health care provider. Make sure you discuss any questions you have with your health care provider. Document Released: 07/02/2005 Document Revised: 06/14/2017 Document Reviewed: 04/17/2017 Elsevier Patient Education  2020 Elsevier Inc.  

## 2019-02-18 LAB — BMP8+EGFR
BUN/Creatinine Ratio: 25 (ref 12–28)
BUN: 20 mg/dL (ref 8–27)
CO2: 24 mmol/L (ref 20–29)
Calcium: 9.6 mg/dL (ref 8.7–10.3)
Chloride: 104 mmol/L (ref 96–106)
Creatinine, Ser: 0.79 mg/dL (ref 0.57–1.00)
GFR calc Af Amer: 89 mL/min/{1.73_m2} (ref >59)
GFR calc non Af Amer: 77 mL/min/{1.73_m2} (ref >59)
Glucose: 116 mg/dL — ABNORMAL HIGH (ref 65–99)
Potassium: 3.7 mmol/L (ref 3.5–5.2)
Sodium: 142 mmol/L (ref 134–144)

## 2019-02-18 LAB — HEMOGLOBIN A1C
Est. average glucose Bld gHb Est-mCnc: 120 mg/dL
Hgb A1c MFr Bld: 5.8 % — ABNORMAL HIGH (ref 4.8–5.6)

## 2019-02-20 ENCOUNTER — Encounter: Payer: Self-pay | Admitting: Internal Medicine

## 2019-03-02 ENCOUNTER — Other Ambulatory Visit: Payer: Self-pay

## 2019-03-02 DIAGNOSIS — E1165 Type 2 diabetes mellitus with hyperglycemia: Secondary | ICD-10-CM

## 2019-03-02 DIAGNOSIS — I10 Essential (primary) hypertension: Secondary | ICD-10-CM

## 2019-03-02 LAB — HM DIABETES EYE EXAM

## 2019-03-02 MED ORDER — PRAVASTATIN SODIUM 40 MG PO TABS: 40.0000 mg | ORAL_TABLET | Freq: Every evening | ORAL | 1 refills | Status: DC

## 2019-03-25 ENCOUNTER — Encounter: Payer: Self-pay | Admitting: Internal Medicine

## 2019-04-02 ENCOUNTER — Encounter: Payer: Self-pay | Admitting: Internal Medicine

## 2019-04-29 ENCOUNTER — Ambulatory Visit: Payer: Medicare Other | Admitting: Internal Medicine

## 2019-04-29 ENCOUNTER — Ambulatory Visit: Payer: Medicare Other

## 2019-05-04 ENCOUNTER — Encounter: Payer: Self-pay | Admitting: Internal Medicine

## 2019-05-05 ENCOUNTER — Other Ambulatory Visit: Payer: Self-pay | Admitting: Internal Medicine

## 2019-05-05 DIAGNOSIS — Z1231 Encounter for screening mammogram for malignant neoplasm of breast: Secondary | ICD-10-CM

## 2019-05-07 ENCOUNTER — Encounter: Payer: Self-pay | Admitting: Internal Medicine

## 2019-05-29 ENCOUNTER — Other Ambulatory Visit: Payer: Self-pay | Admitting: Internal Medicine

## 2019-06-18 ENCOUNTER — Telehealth: Payer: Self-pay

## 2019-06-18 NOTE — Telephone Encounter (Signed)
The pt ws told that [facility] sent Dr. Baird Cancer a letter that the pt's metformin is on the recall list and wants to know if the pt wanted the prescription sent to [facility] because they have some that isn't on the recall list.  The pt said that [facility] sent an email to discard what she has and  they sent the pt another supply that wasn't recalled.

## 2019-06-19 ENCOUNTER — Ambulatory Visit: Payer: Medicare Other

## 2019-06-29 ENCOUNTER — Other Ambulatory Visit: Payer: Self-pay | Admitting: Internal Medicine

## 2019-06-29 ENCOUNTER — Encounter: Payer: Self-pay | Admitting: Internal Medicine

## 2019-06-29 ENCOUNTER — Ambulatory Visit: Payer: Medicare Other | Admitting: Internal Medicine

## 2019-06-29 ENCOUNTER — Other Ambulatory Visit: Payer: Self-pay

## 2019-06-29 ENCOUNTER — Ambulatory Visit: Payer: Medicare Other

## 2019-06-29 VITALS — BP 114/68 | HR 64 | Temp 98.2°F | Ht 66.0 in | Wt 235.4 lb

## 2019-06-29 DIAGNOSIS — Z23 Encounter for immunization: Secondary | ICD-10-CM

## 2019-06-29 DIAGNOSIS — I10 Essential (primary) hypertension: Secondary | ICD-10-CM

## 2019-06-29 DIAGNOSIS — G5603 Carpal tunnel syndrome, bilateral upper limbs: Secondary | ICD-10-CM

## 2019-06-29 DIAGNOSIS — E1165 Type 2 diabetes mellitus with hyperglycemia: Secondary | ICD-10-CM | POA: Diagnosis not present

## 2019-06-29 DIAGNOSIS — Z6837 Body mass index (BMI) 37.0-37.9, adult: Secondary | ICD-10-CM

## 2019-06-29 MED ORDER — PNEUMOCOCCAL 13-VAL CONJ VACC IM SUSP: 0.5000 mL | INTRAMUSCULAR | 0 refills | Status: AC

## 2019-06-29 NOTE — Patient Instructions (Signed)
Health Maintenance, Female Adopting a healthy lifestyle and getting preventive care are important in promoting health and wellness. Ask your health care provider about:  The right schedule for you to have regular tests and exams.  Things you can do on your own to prevent diseases and keep yourself healthy. What should I know about diet, weight, and exercise? Eat a healthy diet   Eat a diet that includes plenty of vegetables, fruits, low-fat dairy products, and lean protein.  Do not eat a lot of foods that are high in solid fats, added sugars, or sodium. Maintain a healthy weight Body mass index (BMI) is used to identify weight problems. It estimates body fat based on height and weight. Your health care provider can help determine your BMI and help you achieve or maintain a healthy weight. Get regular exercise Get regular exercise. This is one of the most important things you can do for your health. Most adults should:  Exercise for at least 150 minutes each week. The exercise should increase your heart rate and make you sweat (moderate-intensity exercise).  Do strengthening exercises at least twice a week. This is in addition to the moderate-intensity exercise.  Spend less time sitting. Even light physical activity can be beneficial. Watch cholesterol and blood lipids Have your blood tested for lipids and cholesterol at 68 years of age, then have this test every 5 years. Have your cholesterol levels checked more often if:  Your lipid or cholesterol levels are high.  You are older than 68 years of age.  You are at high risk for heart disease. What should I know about cancer screening? Depending on your health history and family history, you may need to have cancer screening at various ages. This may include screening for:  Breast cancer.  Cervical cancer.  Colorectal cancer.  Skin cancer.  Lung cancer. What should I know about heart disease, diabetes, and high blood  pressure? Blood pressure and heart disease  High blood pressure causes heart disease and increases the risk of stroke. This is more likely to develop in people who have high blood pressure readings, are of African descent, or are overweight.  Have your blood pressure checked: ? Every 3-5 years if you are 18-39 years of age. ? Every year if you are 40 years old or older. Diabetes Have regular diabetes screenings. This checks your fasting blood sugar level. Have the screening done:  Once every three years after age 40 if you are at a normal weight and have a low risk for diabetes.  More often and at a younger age if you are overweight or have a high risk for diabetes. What should I know about preventing infection? Hepatitis B If you have a higher risk for hepatitis B, you should be screened for this virus. Talk with your health care provider to find out if you are at risk for hepatitis B infection. Hepatitis C Testing is recommended for:  Everyone born from 1945 through 1965.  Anyone with known risk factors for hepatitis C. Sexually transmitted infections (STIs)  Get screened for STIs, including gonorrhea and chlamydia, if: ? You are sexually active and are younger than 68 years of age. ? You are older than 68 years of age and your health care provider tells you that you are at risk for this type of infection. ? Your sexual activity has changed since you were last screened, and you are at increased risk for chlamydia or gonorrhea. Ask your health care provider if   you are at risk.  Ask your health care provider about whether you are at high risk for HIV. Your health care provider may recommend a prescription medicine to help prevent HIV infection. If you choose to take medicine to prevent HIV, you should first get tested for HIV. You should then be tested every 3 months for as long as you are taking the medicine. Pregnancy  If you are about to stop having your period (premenopausal) and  you may become pregnant, seek counseling before you get pregnant.  Take 400 to 800 micrograms (mcg) of folic acid every day if you become pregnant.  Ask for birth control (contraception) if you want to prevent pregnancy. Osteoporosis and menopause Osteoporosis is a disease in which the bones lose minerals and strength with aging. This can result in bone fractures. If you are 65 years old or older, or if you are at risk for osteoporosis and fractures, ask your health care provider if you should:  Be screened for bone loss.  Take a calcium or vitamin D supplement to lower your risk of fractures.  Be given hormone replacement therapy (HRT) to treat symptoms of menopause. Follow these instructions at home: Lifestyle  Do not use any products that contain nicotine or tobacco, such as cigarettes, e-cigarettes, and chewing tobacco. If you need help quitting, ask your health care provider.  Do not use street drugs.  Do not share needles.  Ask your health care provider for help if you need support or information about quitting drugs. Alcohol use  Do not drink alcohol if: ? Your health care provider tells you not to drink. ? You are pregnant, may be pregnant, or are planning to become pregnant.  If you drink alcohol: ? Limit how much you use to 0-1 drink a day. ? Limit intake if you are breastfeeding.  Be aware of how much alcohol is in your drink. In the [place] one drink equals one 12 oz bottle of beer (355 mL), one 5 oz glass of wine (148 mL), or one 1 oz glass of hard liquor (44 mL). General instructions  Schedule regular health, dental, and eye exams.  Stay current with your vaccines.  Tell your health care provider if: ? You often feel depressed. ? You have ever been abused or do not feel safe at home. Summary  Adopting a healthy lifestyle and getting preventive care are important in promoting health and wellness.  Follow your health care provider's instructions about healthy  diet, exercising, and getting tested or screened for diseases.  Follow your health care provider's instructions on monitoring your cholesterol and blood pressure. This information is not intended to replace advice given to you by your health care provider. Make sure you discuss any questions you have with your health care provider. Document Released: 01/15/2011 Document Revised: 06/25/2018 Document Reviewed: 06/25/2018 Elsevier Patient Education  2020 Elsevier Inc.  

## 2019-06-29 NOTE — Progress Notes (Signed)
This visit occurred during the SARS-CoV-2 public health emergency.  Safety protocols were in place, including screening questions prior to the visit, additional usage of staff PPE, and extensive cleaning of exam room while observing appropriate contact time as indicated for disinfecting solutions.  Subjective:     Patient ID: [name] , female    DOB: [birth year] , 68 y.o.   MRN: [id number]   Chief Complaint  Patient presents with  . Hypertension  . Diabetes    HPI  Hypertension This is a chronic problem. The current episode started more than 1 year ago. The problem is unchanged. The problem is controlled. Pertinent negatives include no blurred vision, chest pain, neck pain, orthopnea, palpitations, PND or shortness of breath. The current treatment provides moderate improvement.  Diabetes She presents for her follow-up diabetic visit. She has type 2 diabetes mellitus. Her disease course has been improving. There are no hypoglycemic associated symptoms. There are no diabetic associated symptoms. Pertinent negatives for diabetes include no blurred vision and no chest pain. There are no hypoglycemic complications. There are no diabetic complications. Risk factors for coronary artery disease include diabetes mellitus, hypertension, obesity and post-menopausal. She is compliant with treatment most of the time. She is following a generally healthy diet. She participates in exercise intermittently. Her breakfast blood glucose is taken between 8-9 am. Her breakfast blood glucose range is generally 70-90 mg/dl. Eye exam is current.     Past Medical History:  Diagnosis Date  . Diabetes mellitus without complication ([place])   . GERD (gastroesophageal reflux disease)   . Headache(784.0)   . High cholesterol   . Hypertension   . Sleep apnea    mild- no CPAP  . Stroke [facility]) 2005   mini- stroke due to HTN     Family History  Problem Relation Age of Onset  . Cancer Mother   . Hypertension  Mother   . Cancer Maternal Grandmother   . Hypertension Maternal Grandmother      Current Outpatient Medications:  .  Cholecalciferol (VITAMIN D) 2000 UNITS tablet, Take 2,000 Units by mouth daily. , Disp: , Rfl:  .  diclofenac Sodium (VOLTAREN) 1 % GEL, Place onto the skin., Disp: , Rfl:  .  latanoprost (XALATAN) 0.005 % ophthalmic solution, INSTILL 1 DROP INTO OU HS, Disp: , Rfl:  .  levocetirizine (XYZAL) 5 MG tablet, Take 5 mg by mouth daily as needed for allergies., Disp: , Rfl:  .  metFORMIN (GLUCOPHAGE-XR) 750 MG 24 hr tablet, TAKE 1 TABLET BY MOUTH  DAILY WITH EVENING MEAL, Disp: 90 tablet, Rfl: 3 .  Multiple Vitamin (MULTIVITAMIN WITH MINERALS) TABS tablet, Take 1 tablet by mouth daily., Disp: , Rfl:  .  Olmesartan-amLODIPine-HCTZ 40-5-12.5 MG TABS, TAKE 1 TABLET BY MOUTH  DAILY, Disp: 90 tablet, Rfl: 3 .  ONETOUCH VERIO test strip, USE AS DIRECTED TO CHECK  BLOOD SUGAR ONCE A DAY, Disp: 100 each, Rfl: 1 .  pravastatin (PRAVACHOL) 40 MG tablet, Take 1 tablet (40 mg total) by mouth every evening., Disp: 45 tablet, Rfl: 1 .  Semaglutide,0.25 or 0.5MG/DOS, (OZEMPIC, 0.25 OR 0.5 MG/DOSE,) 2 MG/1.5ML SOPN, Inject 0.5 mg into the skin once a week., Disp: 3 pen, Rfl: 2 .  Triamcinolone Acetonide (NASACORT AQ NA), Place into the nose., Disp: , Rfl:  .  celecoxib (CELEBREX) 200 MG capsule, Take by mouth., Disp: , Rfl:    Allergies  Allergen Reactions  . Codeine Nausea And Vomiting and Diarrhea  . Meperidine  Other reaction(s): Delusions (intolerance)     Review of Systems  Constitutional: Negative.   Eyes: Negative for blurred vision.  Respiratory: Negative.  Negative for shortness of breath.   Cardiovascular: Negative.  Negative for chest pain, palpitations, orthopnea and PND.  Gastrointestinal: Negative.   Musculoskeletal: Negative for neck pain.       She has b/l hand pain. She is followed by Hand surgery for CTS. She reports she was told that sx not "bad enough" for surgery.    Neurological: Negative.   Psychiatric/Behavioral: Negative.      Today's Vitals   06/29/19 1411  BP: 114/68  Pulse: 64  Temp: 98.2 F (36.8 C)  TempSrc: Oral  Weight: 235 lb 6.4 oz (106.8 kg)  Height: 5' 6"  (1.676 m)  PainSc: 4   PainLoc: Hand   Body mass index is 37.99 kg/m.   Objective:  Physical Exam Vitals and nursing note reviewed.  Constitutional:      Appearance: Normal appearance. She is obese.  HENT:     Head: Normocephalic and atraumatic.  Cardiovascular:     Rate and Rhythm: Normal rate and regular rhythm.     Heart sounds: Normal heart sounds.  Pulmonary:     Effort: Pulmonary effort is normal.     Breath sounds: Normal breath sounds.  Skin:    General: Skin is warm.  Neurological:     General: No focal deficit present.     Mental Status: She is alert.  Psychiatric:        Mood and Affect: Mood normal.        Behavior: Behavior normal.         Assessment And Plan:     1. Type 2 diabetes mellitus with hyperglycemia, without long-term current use of insulin (HCC)  Chronic, she is encouraged to incorporate more exercise into her daily routine. She will continue with her current online "boot camp class.   - CMP14+EGFR - Hemoglobin A1c  2. Essential hypertension, benign  Chronic, well controlled. She will continue with current meds. She is encouraged to avoid adding salt to her foods.   3. Bilateral carpal tunnel syndrome  Chronic, seen by Hand specialist today. She may benefit from B-vitamin supplementation.   4. Need for vaccination  Rx [id number] was sent to her local pharmacy. She was also given high dose flu vaccine while in the office today.   - Flu vaccine HIGH DOSE PF (Fluzone High dose)  5. Class 2 severe obesity due to excess calories with serious comorbidity and body mass index (BMI) of 37.0 to 37.9 in adult [facility])  She is encouraged to strive for BMI less than 32 to decrease her cardiac risk.   [name], MD    THE  PATIENT IS ENCOURAGED TO PRACTICE SOCIAL DISTANCING DUE TO THE COVID-19 PANDEMIC.

## 2019-06-30 ENCOUNTER — Ambulatory Visit
Admission: RE | Admit: 2019-06-30 | Discharge: 2019-06-30 | Disposition: A | Discharge status: Home / Self Care | Payer: Medicare Other | Source: Ambulatory Visit | Attending: Internal Medicine | Admitting: Internal Medicine

## 2019-06-30 DIAGNOSIS — Z1231 Encounter for screening mammogram for malignant neoplasm of breast: Secondary | ICD-10-CM

## 2019-06-30 LAB — CMP14+EGFR
ALT: 9 IU/L (ref 0–32)
AST: 17 IU/L (ref 0–40)
Albumin/Globulin Ratio: 1.5 (ref 1.2–2.2)
Albumin: 4 g/dL (ref 3.8–4.8)
Alkaline Phosphatase: 104 IU/L (ref 39–117)
BUN/Creatinine Ratio: 26 (ref 12–28)
BUN: 22 mg/dL (ref 8–27)
Bilirubin Total: <0.2 mg/dL (ref 0.0–1.2)
CO2: 22 mmol/L (ref 20–29)
Calcium: 9.5 mg/dL (ref 8.7–10.3)
Chloride: 103 mmol/L (ref 96–106)
Creatinine, Ser: 0.85 mg/dL (ref 0.57–1.00)
GFR calc Af Amer: 81 mL/min/{1.73_m2} (ref >59)
GFR calc non Af Amer: 71 mL/min/{1.73_m2} (ref >59)
Globulin, Total: 2.7 g/dL (ref 1.5–4.5)
Glucose: 104 mg/dL — ABNORMAL HIGH (ref 65–99)
Potassium: 3.5 mmol/L (ref 3.5–5.2)
Sodium: 140 mmol/L (ref 134–144)
Total Protein: 6.7 g/dL (ref 6.0–8.5)

## 2019-06-30 LAB — HEMOGLOBIN A1C
Est. average glucose Bld gHb Est-mCnc: 131 mg/dL
Hgb A1c MFr Bld: 6.2 % — ABNORMAL HIGH (ref 4.8–5.6)

## 2019-07-06 ENCOUNTER — Encounter: Payer: Self-pay | Admitting: Internal Medicine

## 2019-07-08 ENCOUNTER — Ambulatory Visit: Payer: Self-pay | Admitting: Pharmacist

## 2019-07-08 DIAGNOSIS — E1165 Type 2 diabetes mellitus with hyperglycemia: Secondary | ICD-10-CM

## 2019-07-13 ENCOUNTER — Other Ambulatory Visit: Payer: Self-pay

## 2019-07-13 ENCOUNTER — Ambulatory Visit (INDEPENDENT_AMBULATORY_CARE_PROVIDER_SITE_OTHER): Payer: Medicare Other | Admitting: Pharmacist

## 2019-07-13 DIAGNOSIS — I10 Essential (primary) hypertension: Secondary | ICD-10-CM

## 2019-07-13 DIAGNOSIS — E1165 Type 2 diabetes mellitus with hyperglycemia: Secondary | ICD-10-CM | POA: Diagnosis not present

## 2019-07-13 MED ORDER — PRAVASTATIN SODIUM 40 MG PO TABS: 40.0000 mg | ORAL_TABLET | Freq: Every evening | ORAL | 1 refills | Status: DC

## 2019-07-13 NOTE — Progress Notes (Signed)
  Chronic Care Management   Outreach Note  07/08/2019 Name: [name] MRN: [id number] DOB: 1950-08-04  Referred by: Glendale Chard, MD Reason for referral : Chronic Care Management   An unsuccessful telephone outreach was attempted today. The patient was referred to the case management team by for assistance with care management and care coordination.   Follow Up Plan: A HIPPA compliant phone message was left for the patient providing contact information and requesting a return call.  The care management team will reach out to the patient again over the next 7-10 days.   SIGNATURE Regina Eck, PharmD, BCPS Clinical Pharmacist, [facility] [place]: [phone]

## 2019-07-15 ENCOUNTER — Other Ambulatory Visit: Payer: Self-pay | Admitting: Pharmacy Technician

## 2019-07-15 NOTE — Patient Outreach (Signed)
[facility]) Care Management  07/15/2019  [name] June 23, 1951 [id number]                                       Medication Assistance Referral  Referral From: [facility] Embedded RPh Jenne Pane   Medication/Company: Larna Daughters / Novo Nordisk Patient application portion:  Mailed Provider application portion: Faxed  to Dr. Johnnye Lana Provider address/fax verified via: Office website   Follow up:  Will follow up with patient in 10-14 business days to confirm application(s) have been received.  Maud Deed Chana Bode [place] Certified Pharmacy Technician [facility] Management Direct Dial:[phone]

## 2019-07-16 NOTE — Progress Notes (Signed)
Chronic Care Management   Initial Visit Note  07/13/2019 Name: [name] MRN: [id number] DOB: 09/10/1950  Referred by: Glendale Chard, MD Reason for referral : Chronic Care Management   [name] is a 68 y.o. year old female who is a primary care patient of Glendale Chard, MD. The CCM team was consulted for assistance with chronic disease management and care coordination needs related to DMII  Review of patient status, including review of consultants reports, relevant laboratory and other test results, and collaboration with appropriate care team members and the patient's provider was performed as part of comprehensive patient evaluation and provision of chronic care management services.    I spoke with [name] by telephone today   Medications: Outpatient Encounter Medications as of 07/13/2019  Medication Sig  . celecoxib (CELEBREX) 200 MG capsule Take by mouth.  . Cholecalciferol (VITAMIN D) 2000 UNITS tablet Take 2,000 Units by mouth daily.   . diclofenac Sodium (VOLTAREN) 1 % GEL Place onto the skin.  Marland Kitchen latanoprost (XALATAN) 0.005 % ophthalmic solution INSTILL 1 DROP INTO OU HS  . levocetirizine (XYZAL) 5 MG tablet Take 5 mg by mouth daily as needed for allergies.  . metFORMIN (GLUCOPHAGE-XR) 750 MG 24 hr tablet TAKE 1 TABLET BY MOUTH  DAILY WITH EVENING MEAL  . Multiple Vitamin (MULTIVITAMIN WITH MINERALS) TABS tablet Take 1 tablet by mouth daily.  . Olmesartan-amLODIPine-HCTZ 40-5-12.5 MG TABS TAKE 1 TABLET BY MOUTH  DAILY  . ONETOUCH VERIO test strip USE AS DIRECTED TO CHECK  BLOOD SUGAR ONCE A DAY  . Semaglutide,0.25 or 0.5MG /DOS, (OZEMPIC, 0.25 OR 0.5 MG/DOSE,) 2 MG/1.5ML SOPN Inject 0.5 mg into the skin once a week.  . Triamcinolone Acetonide (NASACORT AQ NA) Place into the nose.  . [DISCONTINUED] pravastatin (PRAVACHOL) 40 MG tablet Take 1 tablet (40 mg total) by mouth every evening.   No facility-administered encounter medications on file as of 07/13/2019.      Objective:   Goals Addressed            This Visit's Progress     Patient Stated   . I would like to optimize my diabetes management (pt-stated)       Current Barriers:  . Diabetes: 123456; complicated by chronic medical conditions including HTN, HLD, most recent A1c 6.2% on 06/29/19 . Current antihyperglycemic regimen: Ozempic 0.5mg  weekly, metformin o Will apply for patient assistance for Ozempic to aid in diabetic goals . Denies hypoglycemic symptoms; Denies hyperglycemic symptoms . Current meal patterns: o Breakfast:eggs/grits/toast o Lunch: sandwich, salads o Supper: protein/vegetable/carb o Drinks: water,coffee, tea, avoids sugary drinks, may have sips of juice occasionally  . Current exercise: walking, encouraged patient  . Current blood glucose readings: 100-110s . Cardiovascular risk reduction: o Current hypertensive regimen: olmesartan, HCTZ, amlodipine o Current hyperlipidemia regimen: pravastatin 40mg  daily o Current antiplatelet regimen: n/a  Pharmacist Clinical Goal(s):  Marland Kitchen Over the next 90 days, patient will work with PharmD and primary care provider to address needs related to diabetes management  Interventions: . Comprehensive medication review performed, medication list updated in electronic medical record . Reviewed & discussed the following diabetes-related information with patient o Follow ADA recommended "diabetes-friendly" diet  (reviewed healthy snack/food options) o Discussed GLP-1 injection technique o Reviewed medication purpose/side effects  Patient Self Care Activities:  . Patient will check blood glucose 3x weekly and as needed if symptomatic , document, and provide at future appointments . Patient will focus on medication adherence . Patient will take medications as  prescribed . Patient will contact provider with any episodes of hypoglycemia . Patient will report any questions or concerns to provider   Initial goal documentation        Plan:   The care management team will reach out to the patient again over the next 30 days.   Provider Signature Regina Eck, PharmD, BCPS Clinical Pharmacist, [facility] [place]: [phone]

## 2019-07-16 NOTE — Patient Instructions (Signed)
Visit Information  Goals Addressed            This Visit's Progress     Patient Stated   . I would like to optimize my diabetes management (pt-stated)       Current Barriers:  . Diabetes: 123456; complicated by chronic medical conditions including HTN, HLD, most recent A1c 6.2% on 06/29/19 . Current antihyperglycemic regimen: Ozempic 0.5mg  weekly, metformin o Will apply for patient assistance for Ozempic to aid in diabetic goals . Denies hypoglycemic symptoms; Denies hyperglycemic symptoms . Current meal patterns: o Breakfast:eggs/grits/toast o Lunch: sandwich, salads o Supper: protein/vegetable/carb o Drinks: water,coffee, tea, avoids sugary drinks, may have sips of juice occasionally  . Current exercise: walking, encouraged patient  . Current blood glucose readings: 100-110s . Cardiovascular risk reduction: o Current hypertensive regimen: olmesartan, HCTZ, amlodipine o Current hyperlipidemia regimen: pravastatin 40mg  daily o Current antiplatelet regimen: n/a  Pharmacist Clinical Goal(s):  Marland Kitchen Over the next 90 days, patient will work with PharmD and primary care provider to address needs related to diabetes management  Interventions: . Comprehensive medication review performed, medication list updated in electronic medical record . Reviewed & discussed the following diabetes-related information with patient o Follow ADA recommended "diabetes-friendly" diet  (reviewed healthy snack/food options) o Discussed GLP-1 injection technique o Reviewed medication purpose/side effects  Patient Self Care Activities:  . Patient will check blood glucose 3x weekly and as needed if symptomatic , document, and provide at future appointments . Patient will focus on medication adherence . Patient will take medications as prescribed . Patient will contact provider with any episodes of hypoglycemia . Patient will report any questions or concerns to provider   Initial goal documentation         The patient verbalized understanding of instructions provided today and declined a print copy of patient instruction materials.   The care management team will reach out to the patient again over the next 30 days.   SIGNATURE Regina Eck, PharmD, BCPS Clinical Pharmacist, [facility] [place]: [phone]

## 2019-07-29 ENCOUNTER — Other Ambulatory Visit: Payer: Self-pay | Admitting: Pharmacy Technician

## 2019-07-29 NOTE — Patient Outreach (Signed)
[name] Care Management  07/29/2019  [name] 1950-10-28 [id number]   Received patient portion(s) of patient assistance application(s) for Ozempic. Faxed completed application and required documents into [employer].  Will follow up with company(ies) in 10-14 business days to check status of application(s).  Maud Deed Chana Bode [place] Certified Pharmacy Technician [place] Management Direct Dial:[phone]

## 2019-08-06 ENCOUNTER — Ambulatory Visit: Payer: Medicare Other | Admitting: Neurology

## 2019-08-07 ENCOUNTER — Other Ambulatory Visit: Payer: Self-pay | Admitting: Pharmacy Technician

## 2019-08-07 NOTE — Patient Outreach (Signed)
[place] [facility]) Care Management  08/07/2019  [name] 1951/02/09 [id number]    Follow up call placed to [employer] regarding patient assistance application(s) for Ozempic , Olivia Mackie confirms patient has been approved as of 1/22 until 07/15/2020. Medication to arrive at providers office in the next 10-14 business days.  Follow up:  Will inform [facility] Embedded RPh Jenne Pane and Sydnee Cabal B @ [facility].  Maud Deed Chana Bode [place] Certified Pharmacy Technician [place] Management Direct Dial:[phone]

## 2019-08-17 ENCOUNTER — Encounter: Payer: Self-pay | Admitting: Internal Medicine

## 2019-08-17 ENCOUNTER — Other Ambulatory Visit: Payer: Self-pay

## 2019-08-17 ENCOUNTER — Telehealth: Payer: Self-pay

## 2019-08-17 DIAGNOSIS — E1165 Type 2 diabetes mellitus with hyperglycemia: Secondary | ICD-10-CM

## 2019-08-17 DIAGNOSIS — I10 Essential (primary) hypertension: Secondary | ICD-10-CM

## 2019-08-17 MED ORDER — TRUE METRIX AIR GLUCOSE METER W/DEVICE KIT: 1.0000 | PACK | Freq: Every day | 1 refills | Status: DC

## 2019-08-17 MED ORDER — BD SWAB SINGLE USE REGULAR PADS: MEDICATED_PAD | 3 refills | Status: DC

## 2019-08-17 MED ORDER — TRUE METRIX LEVEL 1 LOW VI SOLN: 2 refills | Status: DC

## 2019-08-17 MED ORDER — METFORMIN HCL ER 750 MG PO TB24: ORAL_TABLET | ORAL | 3 refills | Status: DC

## 2019-08-17 MED ORDER — TRUEPLUS LANCETS 33G MISC: 3 refills | Status: DC

## 2019-08-17 MED ORDER — PRAVASTATIN SODIUM 40 MG PO TABS: 40.0000 mg | ORAL_TABLET | Freq: Every evening | ORAL | 3 refills | Status: DC

## 2019-08-17 MED ORDER — TRUE METRIX BLOOD GLUCOSE TEST VI STRP: ORAL_STRIP | 3 refills | Status: DC

## 2019-08-17 MED ORDER — OZEMPIC (0.25 OR 0.5 MG/DOSE) 2 MG/1.5ML ~~LOC~~ SOPN: 0.5000 mg | PEN_INJECTOR | SUBCUTANEOUS | 3 refills | Status: DC

## 2019-08-17 NOTE — Telephone Encounter (Signed)
Called pt to clarify what injection she got from [facility] bc we received fax that she was given a flu shot at [facility] on 08/12/2019 but she got one here in the office on 06/29/19. Pt stated the thought she was getting a prevnar. Called pharm and they stated that she was given flu.

## 2019-08-18 ENCOUNTER — Other Ambulatory Visit: Payer: Self-pay

## 2019-08-18 DIAGNOSIS — I10 Essential (primary) hypertension: Secondary | ICD-10-CM

## 2019-08-18 DIAGNOSIS — E1165 Type 2 diabetes mellitus with hyperglycemia: Secondary | ICD-10-CM

## 2019-08-18 MED ORDER — TRUE METRIX BLOOD GLUCOSE TEST VI STRP: ORAL_STRIP | 3 refills | Status: DC

## 2019-08-18 MED ORDER — PRAVASTATIN SODIUM 40 MG PO TABS: 40.0000 mg | ORAL_TABLET | Freq: Every evening | ORAL | 3 refills | Status: DC

## 2019-08-18 MED ORDER — BD SWAB SINGLE USE REGULAR PADS: MEDICATED_PAD | 3 refills | Status: DC

## 2019-08-18 MED ORDER — TRUE METRIX AIR GLUCOSE METER W/DEVICE KIT: 1.0000 | PACK | Freq: Every day | 1 refills | Status: DC

## 2019-08-18 MED ORDER — OLMESARTAN-AMLODIPINE-HCTZ 40-5-12.5 MG PO TABS: 1.0000 | ORAL_TABLET | Freq: Every day | ORAL | 3 refills | Status: DC

## 2019-08-18 MED ORDER — TRUEPLUS LANCETS 33G MISC: 3 refills | Status: DC

## 2019-08-18 MED ORDER — METFORMIN HCL ER 750 MG PO TB24: ORAL_TABLET | ORAL | 3 refills | Status: DC

## 2019-08-18 MED ORDER — OZEMPIC (0.25 OR 0.5 MG/DOSE) 2 MG/1.5ML ~~LOC~~ SOPN: 0.5000 mg | PEN_INJECTOR | SUBCUTANEOUS | 3 refills | Status: DC

## 2019-08-18 MED ORDER — TRUE METRIX LEVEL 1 LOW VI SOLN: 2 refills | Status: DC

## 2019-08-31 DIAGNOSIS — H40023 Open angle with borderline findings, high risk, bilateral: Secondary | ICD-10-CM | POA: Diagnosis not present

## 2019-08-31 DIAGNOSIS — H2513 Age-related nuclear cataract, bilateral: Secondary | ICD-10-CM | POA: Diagnosis not present

## 2019-09-28 ENCOUNTER — Ambulatory Visit (INDEPENDENT_AMBULATORY_CARE_PROVIDER_SITE_OTHER): Payer: Medicare PPO | Admitting: Pharmacist

## 2019-09-28 ENCOUNTER — Other Ambulatory Visit: Payer: Self-pay

## 2019-09-28 ENCOUNTER — Encounter: Payer: Self-pay | Admitting: Internal Medicine

## 2019-09-28 ENCOUNTER — Ambulatory Visit: Payer: Medicare PPO | Admitting: Internal Medicine

## 2019-09-28 VITALS — BP 128/88 | HR 64 | Temp 97.8°F | Ht 66.0 in | Wt 250.8 lb

## 2019-09-28 DIAGNOSIS — Z6841 Body Mass Index (BMI) 40.0 and over, adult: Secondary | ICD-10-CM | POA: Diagnosis not present

## 2019-09-28 DIAGNOSIS — E1165 Type 2 diabetes mellitus with hyperglycemia: Secondary | ICD-10-CM

## 2019-09-28 DIAGNOSIS — E785 Hyperlipidemia, unspecified: Secondary | ICD-10-CM | POA: Diagnosis not present

## 2019-09-28 DIAGNOSIS — I10 Essential (primary) hypertension: Secondary | ICD-10-CM | POA: Diagnosis not present

## 2019-09-28 DIAGNOSIS — R635 Abnormal weight gain: Secondary | ICD-10-CM | POA: Diagnosis not present

## 2019-09-28 NOTE — Patient Instructions (Signed)
Diabetes Mellitus and Exercise Exercising regularly is important for your overall health, especially when you have diabetes (diabetes mellitus). Exercising is not only about losing weight. It has many other health benefits, such as increasing muscle strength and bone density and reducing body fat and stress. This leads to improved fitness, flexibility, and endurance, all of which result in better overall health. Exercise has additional benefits for people with diabetes, including:  Reducing appetite.  Helping to lower and control blood glucose.  Lowering blood pressure.  Helping to control amounts of fatty substances (lipids) in the blood, such as cholesterol and triglycerides.  Helping the body to respond better to insulin (improving insulin sensitivity).  Reducing how much insulin the body needs.  Decreasing the risk for heart disease by: ? Lowering cholesterol and triglyceride levels. ? Increasing the levels of good cholesterol. ? Lowering blood glucose levels. What is my activity plan? Your health care provider or certified diabetes educator can help you make a plan for the type and frequency of exercise (activity plan) that works for you. Make sure that you:  Do at least 150 minutes of moderate-intensity or vigorous-intensity exercise each week. This could be brisk walking, biking, or water aerobics. ? Do stretching and strength exercises, such as yoga or weightlifting, at least 2 times a week. ? Spread out your activity over at least 3 days of the week.  Get some form of physical activity every day. ? Do not go more than 2 days in a row without some kind of physical activity. ? Avoid being inactive for more than 30 minutes at a time. Take frequent breaks to walk or stretch.  Choose a type of exercise or activity that you enjoy, and set realistic goals.  Start slowly, and gradually increase the intensity of your exercise over time. What do I need to know about managing my  diabetes?   Check your blood glucose before and after exercising. ? If your blood glucose is 240 mg/dL (13.3 mmol/L) or higher before you exercise, check your urine for ketones. If you have ketones in your urine, do not exercise until your blood glucose returns to normal. ? If your blood glucose is 100 mg/dL (5.6 mmol/L) or lower, eat a snack containing 15-20 grams of carbohydrate. Check your blood glucose 15 minutes after the snack to make sure that your level is above 100 mg/dL (5.6 mmol/L) before you start your exercise.  Know the symptoms of low blood glucose (hypoglycemia) and how to treat it. Your risk for hypoglycemia increases during and after exercise. Common symptoms of hypoglycemia can include: ? Hunger. ? Anxiety. ? Sweating and feeling clammy. ? Confusion. ? Dizziness or feeling light-headed. ? Increased heart rate or palpitations. ? Blurry vision. ? Tingling or numbness around the mouth, lips, or tongue. ? Tremors or shakes. ? Irritability.  Keep a rapid-acting carbohydrate snack available before, during, and after exercise to help prevent or treat hypoglycemia.  Avoid injecting insulin into areas of the body that are going to be exercised. For example, avoid injecting insulin into: ? The arms, when playing tennis. ? The legs, when jogging.  Keep records of your exercise habits. Doing this can help you and your health care provider adjust your diabetes management plan as needed. Write down: ? Food that you eat before and after you exercise. ? Blood glucose levels before and after you exercise. ? The type and amount of exercise you have done. ? When your insulin is expected to peak, if you use   insulin. Avoid exercising at times when your insulin is peaking.  When you start a new exercise or activity, work with your health care provider to make sure the activity is safe for you, and to adjust your insulin, medicines, or food intake as needed.  Drink plenty of water while  you exercise to prevent dehydration or heat stroke. Drink enough fluid to keep your urine clear or pale yellow. Summary  Exercising regularly is important for your overall health, especially when you have diabetes (diabetes mellitus).  Exercising has many health benefits, such as increasing muscle strength and bone density and reducing body fat and stress.  Your health care provider or certified diabetes educator can help you make a plan for the type and frequency of exercise (activity plan) that works for you.  When you start a new exercise or activity, work with your health care provider to make sure the activity is safe for you, and to adjust your insulin, medicines, or food intake as needed. This information is not intended to replace advice given to you by your health care provider. Make sure you discuss any questions you have with your health care provider. Document Revised: 01/24/2017 Document Reviewed: 12/12/2015 Elsevier Patient Education  2020 Elsevier Inc.  

## 2019-09-28 NOTE — Progress Notes (Signed)
Chronic Care Management    Visit Note  09/29/2019 Name: [name] MRN: [id number] DOB: 1950/09/21  Referred by: Glendale Chard, MD Reason for referral : Chronic Care Management and Diabetes   [name] is a 69 y.o. year old female who is a primary care patient of Glendale Chard, MD. The CCM team was consulted for assistance with chronic disease management and care coordination needs related to DMII  Review of patient status, including review of consultants reports, relevant laboratory and other test results, and collaboration with appropriate care team members and the patient's provider was performed as part of comprehensive patient evaluation and provision of chronic care management services.    SDOH (Social Determinants of Health) assessments performed: No See Care Plan activities for detailed interventions related to SDOH)     Medications: Outpatient Encounter Medications as of 09/28/2019  Medication Sig  . Alcohol Swabs (B-D SINGLE USE SWABS REGULAR) PADS Use as directed to check blood sugars 1 time per dx: e11.22  . Blood Glucose Calibration (TRUE METRIX LEVEL 1) Low SOLN Use as directed to check blood sugars 1 time per dx: e11.22  . Blood Glucose Monitoring Suppl (TRUE METRIX AIR GLUCOSE METER) w/Device KIT Inject 1 kit into the skin daily. Use as directed to check blood sugars 1 time per dx: e11.22  . Cholecalciferol (VITAMIN D) 2000 UNITS tablet Take 2,000 Units by mouth daily.   . diclofenac Sodium (VOLTAREN) 1 % GEL Place onto the skin.  Marland Kitchen glucose blood (TRUE METRIX BLOOD GLUCOSE TEST) test strip Use as directed to check blood sugars 1 time per dx: e11.22  . latanoprost (XALATAN) 0.005 % ophthalmic solution INSTILL 1 DROP INTO OU HS  . levocetirizine (XYZAL) 5 MG tablet Take 5 mg by mouth daily as needed for allergies.  . metFORMIN (GLUCOPHAGE-XR) 750 MG 24 hr tablet TAKE 1 TABLET BY MOUTH  DAILY WITH EVENING MEAL  . Multiple Vitamin (MULTIVITAMIN WITH MINERALS) TABS  tablet Take 1 tablet by mouth daily.  . Olmesartan-amLODIPine-HCTZ 40-5-12.5 MG TABS Take 1 tablet by mouth daily.  . pravastatin (PRAVACHOL) 40 MG tablet Take 1 tablet (40 mg total) by mouth every evening.  . Semaglutide,0.25 or 0.5MG/DOS, (OZEMPIC, 0.25 OR 0.5 MG/DOSE,) 2 MG/1.5ML SOPN Inject 0.5 mg into the skin once a week.  . Triamcinolone Acetonide (NASACORT AQ NA) Place into the nose.  . TRUEplus Lancets 33G MISC Use as directed to check blood sugars 1 time per dx: e11.22   No facility-administered encounter medications on file as of 09/28/2019.     Objective:   Goals Addressed            This Visit's Progress     Patient Stated   . I would like to optimize my diabetes management (pt-stated)       Current Barriers:  . Diabetes: Y0KH; complicated by chronic medical conditions including HTN, HLD, most recent A1c 6.1% on 09/29/19 (today) . Current antihyperglycemic regimen: Ozempic 0.95m weekly, metformin o Patient approved for assistance through [employer] program until 07/15/20.  Last refill to be sent in by 06/14/20.  Company to ship to PCP office ([phone]) o MD considering increasing Ozempic to [id number] for weight loss--will submit dose change form  . Denies hypoglycemic symptoms; Denies hyperglycemic symptoms . Current meal patterns: o Breakfast:eggs/grits/toast o Lunch: sandwich, salads o Supper: protein/vegetable/carb o Drinks: water,coffee, tea, avoids sugary drinks, may have sips of juice occasionally  . Current exercise: walking, encouraged patient  . Current  blood glucose readings: 100-110s . Cardiovascular risk reduction: o Current hypertensive regimen: olmesartan, HCTZ, amlodipine o Current hyperlipidemia regimen: pravastatin 74m daily (LDL 84 on 10/14/18--needs repeat LDL) o Current antiplatelet regimen: n/a  Pharmacist Clinical Goal(s):  .Marland KitchenOver the next 90 days, patient will work with PharmD and primary care provider to address  needs related to diabetes management  Interventions: . Comprehensive medication review performed, medication list updated in electronic medical record . Reviewed & discussed the following diabetes-related information with patient o Follow ADA recommended "diabetes-friendly" diet  (reviewed healthy snack/food options) o Discussed GLP-1 injection technique o Reviewed medication purpose/side effects  Patient Self Care Activities:  . Patient will check blood glucose 3x weekly and as needed if symptomatic , document, and provide at future appointments . Patient will focus on medication adherence . Patient will take medications as prescribed . Patient will contact provider with any episodes of hypoglycemia . Patient will report any questions or concerns to provider   Please see past updates related to this goal by clicking on the "Past Updates" button in the selected goal         Plan:   The care management team will reach out to the patient again over the next 30 days.   Provider Signature JRegina Eck PharmD, BCPS Clinical Pharmacist, [facility] [place] [phone]

## 2019-09-28 NOTE — Progress Notes (Signed)
This visit occurred during the SARS-CoV-2 public health emergency.  Safety protocols were in place, including screening questions prior to the visit, additional usage of staff PPE, and extensive cleaning of exam room while observing appropriate contact time as indicated for disinfecting solutions.  Subjective:     Patient ID: [name] , female    DOB: 10-19-1950 , 69 y.o.   MRN: [id number]   Chief Complaint  Patient presents with  . Diabetes  . Hypertension    HPI  Diabetes She presents for her follow-up diabetic visit. She has type 2 diabetes mellitus. Her disease course has been improving. There are no hypoglycemic associated symptoms. There are no diabetic associated symptoms. Pertinent negatives for diabetes include no blurred vision and no chest pain. There are no hypoglycemic complications. There are no diabetic complications. Risk factors for coronary artery disease include diabetes mellitus, hypertension, obesity and post-menopausal. She is compliant with treatment most of the time. She is following a generally healthy diet. She participates in exercise intermittently. Her breakfast blood glucose is taken between 8-9 am. Her breakfast blood glucose range is generally 70-90 mg/dl. Eye exam is current.  Hypertension This is a chronic problem. The current episode started more than 1 year ago. The problem is unchanged. The problem is controlled. Pertinent negatives include no blurred vision, chest pain, neck pain, orthopnea, palpitations, PND or shortness of breath. The current treatment provides moderate improvement.     Past Medical History:  Diagnosis Date  . Diabetes mellitus without complication ([place])   . GERD (gastroesophageal reflux disease)   . Headache(784.0)   . High cholesterol   . Hypertension   . Sleep apnea    mild- no CPAP  . Stroke [facility]) 2005   mini- stroke due to HTN     Family History  Problem Relation Age of Onset  . Cancer Mother   . Hypertension  Mother   . Cancer Maternal Grandmother   . Hypertension Maternal Grandmother      Current Outpatient Medications:  .  Alcohol Swabs (B-D SINGLE USE SWABS REGULAR) PADS, Use as directed to check blood sugars 1 time per dx: e11.22, Disp: 200 each, Rfl: 3 .  Blood Glucose Calibration (TRUE METRIX LEVEL 1) Low SOLN, Use as directed to check blood sugars 1 time per dx: e11.22, Disp: 2 each, Rfl: 2 .  Blood Glucose Monitoring Suppl (TRUE METRIX AIR GLUCOSE METER) w/Device KIT, Inject 1 kit into the skin daily. Use as directed to check blood sugars 1 time per dx: e11.22, Disp: 1 kit, Rfl: 1 .  Cholecalciferol (VITAMIN D) 2000 UNITS tablet, Take 2,000 Units by mouth daily. , Disp: , Rfl:  .  diclofenac Sodium (VOLTAREN) 1 % GEL, Place onto the skin., Disp: , Rfl:  .  glucose blood (TRUE METRIX BLOOD GLUCOSE TEST) test strip, Use as directed to check blood sugars 1 time per dx: e11.22, Disp: 200 each, Rfl: 3 .  latanoprost (XALATAN) 0.005 % ophthalmic solution, INSTILL 1 DROP INTO OU HS, Disp: , Rfl:  .  levocetirizine (XYZAL) 5 MG tablet, Take 5 mg by mouth daily as needed for allergies., Disp: , Rfl:  .  metFORMIN (GLUCOPHAGE-XR) 750 MG 24 hr tablet, TAKE 1 TABLET BY MOUTH  DAILY WITH EVENING MEAL, Disp: 90 tablet, Rfl: 3 .  Multiple Vitamin (MULTIVITAMIN WITH MINERALS) TABS tablet, Take 1 tablet by mouth daily., Disp: , Rfl:  .  Olmesartan-amLODIPine-HCTZ 40-5-12.5 MG TABS, Take 1 tablet by mouth daily., Disp: 90 tablet, Rfl:  3 .  pravastatin (PRAVACHOL) 40 MG tablet, Take 1 tablet (40 mg total) by mouth every evening., Disp: 45 tablet, Rfl: 3 .  Semaglutide,0.25 or 0.5MG/DOS, (OZEMPIC, 0.25 OR 0.5 MG/DOSE,) 2 MG/1.5ML SOPN, Inject 0.5 mg into the skin once a week., Disp: 3 pen, Rfl: 3 .  Triamcinolone Acetonide (NASACORT AQ NA), Place into the nose., Disp: , Rfl:  .  TRUEplus Lancets 33G MISC, Use as directed to check blood sugars 1 time per dx: e11.22, Disp: 200 each, Rfl: 3   Allergies  Allergen  Reactions  . Codeine Nausea And Vomiting and Diarrhea  . Meperidine     Other reaction(s): Delusions (intolerance)     Review of Systems  Constitutional: Negative.   Eyes: Negative for blurred vision.  Respiratory: Negative.  Negative for shortness of breath.   Cardiovascular: Negative.  Negative for chest pain, palpitations, orthopnea and PND.  Gastrointestinal: Negative.   Musculoskeletal: Negative for neck pain.  Neurological: Negative.   Psychiatric/Behavioral: Negative.      Today's Vitals   09/28/19 1412  BP: 128/88  Pulse: 64  Temp: 97.8 F (36.6 C)  TempSrc: Oral  SpO2: 96%  Weight: 250 lb 12.8 oz (113.8 kg)  Height: 5' 6"  (1.676 m)   Body mass index is 40.48 kg/m.   Wt Readings from Last 3 Encounters:  09/28/19 250 lb 12.8 oz (113.8 kg)  06/29/19 235 lb 6.4 oz (106.8 kg)  02/17/19 231 lb 9.6 oz (105.1 kg)     Objective:  Physical Exam Vitals and nursing note reviewed.  Constitutional:      Appearance: Normal appearance. She is obese.  HENT:     Head: Normocephalic and atraumatic.  Cardiovascular:     Rate and Rhythm: Normal rate and regular rhythm.     Heart sounds: Normal heart sounds.  Pulmonary:     Effort: Pulmonary effort is normal.     Breath sounds: Normal breath sounds.  Skin:    General: Skin is warm.  Neurological:     General: No focal deficit present.     Mental Status: She is alert.  Psychiatric:        Mood and Affect: Mood normal.        Behavior: Behavior normal.         Assessment And Plan:     1. Type 2 diabetes mellitus with hyperglycemia, without long-term current use of insulin (HCC)  Chronic, BS appear to be stable. I will check labs as listed below. I will make medication changes as needed.   - Hemoglobin A1c - BMP8+EGFR  2. Essential hypertension, benign  Chronic, fair control. She is encouraged to exercise no less than five days per week for at least 30 minutes.   3. Weight gain  She was made aware of 15  pound weight gain since Dec 2020. She is encouraged to cut back on her carb intake and to avoid sugary beverages.   4. Class 3 severe obesity due to excess calories with serious comorbidity and body mass index (BMI) of 40.0 to 44.9 in adult (HCC)  BMI 40. She will strive to lose ten to fifteen pounds within three months prior to her next physical examination. She is encouraged to strive for BMI less than 30 to decrease cardiac risk.   [name], MD    THE PATIENT IS ENCOURAGED TO PRACTICE SOCIAL DISTANCING DUE TO THE COVID-19 PANDEMIC.

## 2019-09-29 LAB — HEMOGLOBIN A1C
Est. average glucose Bld gHb Est-mCnc: 128 mg/dL
Hgb A1c MFr Bld: 6.1 % — ABNORMAL HIGH (ref 4.8–5.6)

## 2019-09-29 LAB — BMP8+EGFR
BUN/Creatinine Ratio: 19 (ref 12–28)
BUN: 15 mg/dL (ref 8–27)
CO2: 25 mmol/L (ref 20–29)
Calcium: 9.4 mg/dL (ref 8.7–10.3)
Chloride: 104 mmol/L (ref 96–106)
Creatinine, Ser: 0.77 mg/dL (ref 0.57–1.00)
GFR calc Af Amer: 92 mL/min/{1.73_m2} (ref >59)
GFR calc non Af Amer: 80 mL/min/{1.73_m2} (ref >59)
Glucose: 82 mg/dL (ref 65–99)
Potassium: 3.9 mmol/L (ref 3.5–5.2)
Sodium: 141 mmol/L (ref 134–144)

## 2019-09-29 NOTE — Patient Instructions (Signed)
Visit Information  Goals Addressed            This Visit's Progress     Patient Stated   . I would like to optimize my diabetes management (pt-stated)       Current Barriers:  . Diabetes: 123456; complicated by chronic medical conditions including HTN, HLD, most recent A1c 6.1% on 09/29/19 (today) . Current antihyperglycemic regimen: Ozempic 0.5mg  weekly, metformin o Patient approved for assistance through [employer] patient assistance program until 07/15/20.  Last refill to be sent in by 06/14/20.  Company to ship to PCP office (23-month supply) o MD considering increasing Ozempic to 1mg  weekly for weight loss--will submit dose change form  . Denies hypoglycemic symptoms; Denies hyperglycemic symptoms . Current meal patterns: o Breakfast:eggs/grits/toast o Lunch: sandwich, salads o Supper: protein/vegetable/carb o Drinks: water,coffee, tea, avoids sugary drinks, may have sips of juice occasionally  . Current exercise: walking, encouraged patient  . Current blood glucose readings: 100-110s . Cardiovascular risk reduction: o Current hypertensive regimen: olmesartan, HCTZ, amlodipine o Current hyperlipidemia regimen: pravastatin 40mg  daily (LDL 84 on 10/14/18--needs repeat LDL) o Current antiplatelet regimen: n/a  Pharmacist Clinical Goal(s):  Marland Kitchen Over the next 90 days, patient will work with PharmD and primary care provider to address needs related to diabetes management  Interventions: . Comprehensive medication review performed, medication list updated in electronic medical record . Reviewed & discussed the following diabetes-related information with patient o Follow ADA recommended "diabetes-friendly" diet  (reviewed healthy snack/food options) o Discussed GLP-1 injection technique o Reviewed medication purpose/side effects  Patient Self Care Activities:  . Patient will check blood glucose 3x weekly and as needed if symptomatic , document, and provide at future  appointments . Patient will focus on medication adherence . Patient will take medications as prescribed . Patient will contact provider with any episodes of hypoglycemia . Patient will report any questions or concerns to provider   Please see past updates related to this goal by clicking on the "Past Updates" button in the selected goal         The patient verbalized understanding of instructions provided today and declined a print copy of patient instruction materials.   The care management team will reach out to the patient again over the next 60 days.   SIGNATURE Regina Eck, PharmD, BCPS Clinical Pharmacist, [facility] [place]: [phone]

## 2019-10-06 ENCOUNTER — Telehealth: Payer: Self-pay

## 2019-10-06 NOTE — Telephone Encounter (Signed)
-----   Message from Glendale Chard, MD sent at 10/06/2019  2:25 PM EDT ----- Here are your lab results:  Your hba1c is 6.1, this is improved from last visit. Congratulations!  Your kidney function is stable.   Please let me know if you have any questions or concerns. Stay safe!   Sincerely,    Robyn N. Baird Cancer, MD

## 2019-10-06 NOTE — Telephone Encounter (Signed)
Left vm for pt to call back or look on mychart for lab results

## 2019-10-21 ENCOUNTER — Telehealth: Payer: Self-pay

## 2019-10-21 ENCOUNTER — Ambulatory Visit: Payer: Self-pay

## 2019-10-21 ENCOUNTER — Other Ambulatory Visit: Payer: Self-pay

## 2019-10-21 DIAGNOSIS — E1165 Type 2 diabetes mellitus with hyperglycemia: Secondary | ICD-10-CM

## 2019-10-21 DIAGNOSIS — I10 Essential (primary) hypertension: Secondary | ICD-10-CM

## 2019-10-21 DIAGNOSIS — E785 Hyperlipidemia, unspecified: Secondary | ICD-10-CM

## 2019-10-23 NOTE — Chronic Care Management (AMB) (Signed)
Chronic Care Management   Follow Up Note   10/21/2019 Name: [name] MRN: [id number] DOB: 1951/07/01  Referred by: Glendale Chard, MD Reason for referral : Chronic Care Management (RQ Initial RN Call )   [name] is a 69 y.o. year old female who is a primary care patient of Glendale Chard, MD. The CCM team was consulted for assistance with chronic disease management and care coordination needs.    Review of patient status, including review of consultants reports, relevant laboratory and other test results, and collaboration with appropriate care team members and the patient's provider was performed as part of comprehensive patient evaluation and provision of chronic care management services.    Reviewed chart in preparation to make initial CCM RN CM outreach.      Outpatient Encounter Medications as of 10/21/2019  Medication Sig  . Alcohol Swabs (B-D SINGLE USE SWABS REGULAR) PADS Use as directed to check blood sugars 1 time per dx: e11.22  . Blood Glucose Calibration (TRUE METRIX LEVEL 1) Low SOLN Use as directed to check blood sugars 1 time per dx: e11.22  . Blood Glucose Monitoring Suppl (TRUE METRIX AIR GLUCOSE METER) w/Device KIT Inject 1 kit into the skin daily. Use as directed to check blood sugars 1 time per dx: e11.22  . Cholecalciferol (VITAMIN D) 2000 UNITS tablet Take 2,000 Units by mouth daily.   . diclofenac Sodium (VOLTAREN) 1 % GEL Place onto the skin.  Marland Kitchen glucose blood (TRUE METRIX BLOOD GLUCOSE TEST) test strip Use as directed to check blood sugars 1 time per dx: e11.22  . latanoprost (XALATAN) 0.005 % ophthalmic solution INSTILL 1 DROP INTO OU HS  . levocetirizine (XYZAL) 5 MG tablet Take 5 mg by mouth daily as needed for allergies.  . metFORMIN (GLUCOPHAGE-XR) 750 MG 24 hr tablet TAKE 1 TABLET BY MOUTH  DAILY WITH EVENING MEAL  . Multiple Vitamin (MULTIVITAMIN WITH MINERALS) TABS tablet Take 1 tablet by mouth daily.  . Olmesartan-amLODIPine-HCTZ 40-5-12.5 MG TABS  Take 1 tablet by mouth daily.  . pravastatin (PRAVACHOL) 40 MG tablet Take 1 tablet (40 mg total) by mouth every evening.  . Semaglutide,0.25 or 0.5MG/DOS, (OZEMPIC, 0.25 OR 0.5 MG/DOSE,) 2 MG/1.5ML SOPN Inject 0.5 mg into the skin once a week.  . Triamcinolone Acetonide (NASACORT AQ NA) Place into the nose.  . TRUEplus Lancets 33G MISC Use as directed to check blood sugars 1 time per dx: e11.22   No facility-administered encounter medications on file as of 10/21/2019.     Objective:  Lab Results  Component Value Date   HGBA1C 6.1 (H) 09/28/2019   HGBA1C 6.2 (H) 06/29/2019   HGBA1C 5.8 (H) 02/17/2019   Lab Results  Component Value Date   MICROALBUR 30 01/06/2019   LDLCALC 84 10/14/2018   CREATININE 0.77 09/28/2019   BP Readings from Last 3 Encounters:  09/28/19 128/88  06/29/19 114/68  02/17/19 116/82               Goals Addressed    Patient Stated:       Current Barriers:   Diabetes: R1VQ; complicated by chronic medical conditions including HTN, HLD, most recent A1c 6.1% on 09/29/19 (today)  Current antihyperglycemic regimen: Ozempic 0.42m weekly, metformin ? Patient approved for assistance through [employer] program until 07/15/20.  Last refill to be sent in by 06/14/20.  Company to ship to PCP office ([phone]) ? MD considering increasing Ozempic to [id number] for weight loss--will submit dose  change form   Denies hypoglycemic symptoms; Denies hyperglycemic symptoms  Current meal patterns: ? Breakfast:eggs/grits/toast ? Lunch: sandwich, salads ? Supper: protein/vegetable/carb ? Drinks: water,coffee, tea, avoids sugary drinks, may have sips of juice occasionally   Current exercise: walking, encouraged patient   Current blood glucose readings: 100-110s  Cardiovascular risk reduction: ? Current hypertensive regimen: olmesartan, HCTZ, amlodipine ? Current hyperlipidemia regimen: pravastatin 32m daily (LDL 84 on 10/14/18--needs repeat  LDL) ? Current antiplatelet regimen: n/a  Pharmacist Clinical Goal(s):   Over the next 90 days, patient will work with PharmD and primary care provider to address needs related to diabetes management  Interventions:  Comprehensive medication review performed, medication list updated in electronic medical record  Reviewed & discussed the following diabetes-related information with patient ? Follow ADA recommended "diabetes-friendly" diet  (reviewed healthy snack/food options) ? Discussed GLP-1 injection technique ? Reviewed medication purpose/side effects  Patient Self Care Activities:   Patient will check blood glucose 3x weekly and as needed if symptomatic , document, and provide at future appointments  Patient will focus on medication adherence  Patient will take medications as prescribed  Patient will contact provider with any episodes of hypoglycemia  Patient will report any questions or concerns to provider   Please see past updates related to this goal by clicking on the "Past Updates" button in the selected goal        Plan:   Telephone follow up appointment with care management team member scheduled for: 11/09/19   ABarb Merino RN, BSN, CCM Care Management Coordinator [facility]  Direct Phone: [phone]

## 2019-11-09 ENCOUNTER — Other Ambulatory Visit: Payer: Self-pay

## 2019-11-09 ENCOUNTER — Ambulatory Visit (INDEPENDENT_AMBULATORY_CARE_PROVIDER_SITE_OTHER): Payer: Medicare PPO

## 2019-11-09 ENCOUNTER — Telehealth: Payer: Self-pay

## 2019-11-09 DIAGNOSIS — E1165 Type 2 diabetes mellitus with hyperglycemia: Secondary | ICD-10-CM

## 2019-11-09 DIAGNOSIS — E785 Hyperlipidemia, unspecified: Secondary | ICD-10-CM

## 2019-11-09 DIAGNOSIS — I10 Essential (primary) hypertension: Secondary | ICD-10-CM | POA: Diagnosis not present

## 2019-11-10 NOTE — Chronic Care Management (AMB) (Signed)
  Chronic Care Management   Outreach Note  11/10/2019 Name: KARISE WEICH MRN: [id number] DOB: [birth year]  Referred by: Glendale Chard, MD Reason for referral : Chronic Care Management (RQ Initial RN Call )   An unsuccessful telephone outreach was attempted today. The patient was referred to the case management team for assistance with care management and care coordination.   Follow Up Plan: A HIPPA compliant phone message was left for the patient providing contact information and requesting a return call.  Telephone follow up appointment with care management team member scheduled for: 11/27/19  Barb Merino, RN, BSN, CCM Care Management Coordinator [place] [facility]  Direct Phone: [phone]

## 2019-11-26 ENCOUNTER — Telehealth: Payer: Self-pay

## 2019-11-26 NOTE — Telephone Encounter (Signed)
Pt informed that her medication for the PAP is here and ready for pick up

## 2019-11-27 ENCOUNTER — Ambulatory Visit: Payer: Self-pay

## 2019-11-27 ENCOUNTER — Other Ambulatory Visit: Payer: Self-pay

## 2019-11-27 ENCOUNTER — Telehealth: Payer: Self-pay

## 2019-11-27 DIAGNOSIS — E1165 Type 2 diabetes mellitus with hyperglycemia: Secondary | ICD-10-CM

## 2019-11-27 DIAGNOSIS — E785 Hyperlipidemia, unspecified: Secondary | ICD-10-CM

## 2019-11-27 DIAGNOSIS — I10 Essential (primary) hypertension: Secondary | ICD-10-CM

## 2019-11-30 NOTE — Chronic Care Management (AMB) (Signed)
  Chronic Care Management   Outreach Note  11/30/2019 Name: [name] MRN: [id number] DOB: December 31, 1950  Referred by: Glendale Chard, MD Reason for referral : Chronic Care Management (RQ #2 Initial RN Call - See [employer] message)   A second unsuccessful telephone outreach was attempted today. The patient was referred to the case management team for assistance with care management and care coordination.   Follow Up Plan: A HIPPA compliant phone message was left for the patient providing contact information and requesting a return call.  Telephone follow up appointment with care management team member scheduled for: 12/28/19  Barb Merino, RN, BSN, CCM Care Management Coordinator [place] [facility]  Direct Phone: [phone]

## 2019-12-01 ENCOUNTER — Other Ambulatory Visit: Payer: Self-pay

## 2019-12-01 ENCOUNTER — Ambulatory Visit: Payer: Medicare PPO | Admitting: Internal Medicine

## 2019-12-01 ENCOUNTER — Encounter: Payer: Self-pay | Admitting: Internal Medicine

## 2019-12-01 VITALS — BP 130/86 | HR 75 | Temp 98.4°F | Ht 66.0 in | Wt 247.6 lb

## 2019-12-01 DIAGNOSIS — Z6839 Body mass index (BMI) 39.0-39.9, adult: Secondary | ICD-10-CM | POA: Diagnosis not present

## 2019-12-01 DIAGNOSIS — R0981 Nasal congestion: Secondary | ICD-10-CM | POA: Diagnosis not present

## 2019-12-01 MED ORDER — TRIAMCINOLONE ACETONIDE 40 MG/ML IJ SUSP
60.0000 mg | Freq: Once | INTRAMUSCULAR | Status: AC
  Administered 2019-12-01: 60 mg via INTRAMUSCULAR

## 2019-12-01 NOTE — Patient Instructions (Addendum)
    Take Norel aD one at dinner for next 2-3 days. Please let me know if your symptoms persist.   Sinus Headache  A sinus headache happens when your sinuses get swollen or blocked (clogged). Sinuses are spaces behind the bones of your face and forehead. You may feel pain or pressure in your face, forehead, ears, or upper teeth. Sinus headaches can be mild or very bad. Follow these instructions at home: General instructions  If told: ? Apply a warm, moist washcloth to your face. This can help to lessen pain. ? Use a nasal saline wash. Follow the directions on the bottle or box. Medicines   Take over-the-counter and prescription medicines only as told by your doctor.  If you were prescribed an antibiotic medicine, take it as told by your doctor. Do not stop taking it even if you start to feel better.  Use a nose spray if your nose feels full of mucus (congested). Hydrate and humidify  Drink enough water to keep your pee (urine) pale yellow.  Use a cool mist humidifier to keep the humidity level in your home above 50%.  Breathe in steam for 10-15 minutes, 3-4 times a day or as told by your doctor. You can do this in the bathroom while a hot shower is running.  Try not to spend time in cool or dry air. Contact a doctor if:  You get more than one headache a week.  Light or sound bothers you.  You have a fever.  You feel sick to your stomach (nauseous) or you throw up (vomit).  Your headaches do not get better with treatment. Get help right away if:  You have trouble seeing.  You suddenly have very bad pain in your face or head.  You start to have quick, sudden movements or shaking that you cannot control (seizure).  You are confused.  You have a stiff neck. Summary  A sinus headache happens when your sinuses get swollen or blocked (clogged). Sinuses are spaces behind the bones of your face and forehead.  You may feel pain or pressure in your face, forehead, ears, or  upper teeth.  Take over-the-counter and prescription medicines only as told by your doctor.  If told, apply a warm, moist washcloth to your face. This can help to lessen pain. This information is not intended to replace advice given to you by your health care provider. Make sure you discuss any questions you have with your health care provider. Document Revised: 06/14/2017 Document Reviewed: 04/12/2017 Elsevier Patient Education  2020 [employer].

## 2019-12-02 ENCOUNTER — Ambulatory Visit: Payer: Medicare PPO | Admitting: Internal Medicine

## 2019-12-05 NOTE — Progress Notes (Signed)
This visit occurred during the SARS-CoV-2 public health emergency.  Safety protocols were in place, including screening questions prior to the visit, additional usage of staff PPE, and extensive cleaning of exam room while observing appropriate contact time as indicated for disinfecting solutions.  Subjective:     Patient ID: [name] , female    DOB: 04/01/1951 , 69 y.o.   MRN: [id number]   Chief Complaint  Patient presents with  . Nasal Congestion    HPI  She presents today for further evaluation of nasal congestion.  She denies fever/chills. Her sx started last week. No fever/chills. Her mucus is clear. She denies cough. She denies tooth pain; however, she has had occasional headache. Denies ill contacts. Did go out to eat for her birthday this past weekend.     Past Medical History:  Diagnosis Date  . Diabetes mellitus without complication ([place])   . GERD (gastroesophageal reflux disease)   . Headache(784.0)   . High cholesterol   . Hypertension   . Sleep apnea    mild- no CPAP  . Stroke [facility]) 2005   mini- stroke due to HTN     Family History  Problem Relation Age of Onset  . Cancer Mother   . Hypertension Mother   . Cancer Maternal Grandmother   . Hypertension Maternal Grandmother      Current Outpatient Medications:  .  Alcohol Swabs (B-D SINGLE USE SWABS REGULAR) PADS, Use as directed to check blood sugars 1 time per dx: e11.22, Disp: 200 each, Rfl: 3 .  Blood Glucose Calibration (TRUE METRIX LEVEL 1) Low SOLN, Use as directed to check blood sugars 1 time per dx: e11.22, Disp: 2 each, Rfl: 2 .  Blood Glucose Monitoring Suppl (TRUE METRIX AIR GLUCOSE METER) w/Device KIT, Inject 1 kit into the skin daily. Use as directed to check blood sugars 1 time per dx: e11.22, Disp: 1 kit, Rfl: 1 .  Cholecalciferol (VITAMIN D) 2000 UNITS tablet, Take 2,000 Units by mouth daily. , Disp: , Rfl:  .  diclofenac Sodium (VOLTAREN) 1 % GEL, Place onto the skin., Disp: , Rfl:  .   glucose blood (TRUE METRIX BLOOD GLUCOSE TEST) test strip, Use as directed to check blood sugars 1 time per dx: e11.22, Disp: 200 each, Rfl: 3 .  latanoprost (XALATAN) 0.005 % ophthalmic solution, INSTILL 1 DROP INTO OU HS, Disp: , Rfl:  .  levocetirizine (XYZAL) 5 MG tablet, Take 5 mg by mouth daily as needed for allergies., Disp: , Rfl:  .  metFORMIN (GLUCOPHAGE-XR) 750 MG 24 hr tablet, TAKE 1 TABLET BY MOUTH  DAILY WITH EVENING MEAL, Disp: 90 tablet, Rfl: 3 .  Multiple Vitamin (MULTIVITAMIN WITH MINERALS) TABS tablet, Take 1 tablet by mouth daily., Disp: , Rfl:  .  Olmesartan-amLODIPine-HCTZ 40-5-12.5 MG TABS, Take 1 tablet by mouth daily., Disp: 90 tablet, Rfl: 3 .  pravastatin (PRAVACHOL) 40 MG tablet, Take 1 tablet (40 mg total) by mouth every evening., Disp: 45 tablet, Rfl: 3 .  Semaglutide,0.25 or 0.5MG/DOS, (OZEMPIC, 0.25 OR 0.5 MG/DOSE,) 2 MG/1.5ML SOPN, Inject 0.5 mg into the skin once a week., Disp: 3 pen, Rfl: 3 .  Triamcinolone Acetonide (NASACORT AQ NA), Place into the nose., Disp: , Rfl:  .  TRUEplus Lancets 33G MISC, Use as directed to check blood sugars 1 time per dx: e11.22, Disp: 200 each, Rfl: 3   Allergies  Allergen Reactions  . Codeine Nausea And Vomiting and Diarrhea  . Meperidine  Other reaction(s): Delusions (intolerance)     Review of Systems  Constitutional: Negative.   HENT: Positive for congestion.   Respiratory: Negative.   Cardiovascular: Negative.   Gastrointestinal: Negative.   Neurological: Negative.   Psychiatric/Behavioral: Negative.      Today's Vitals   12/01/19 1539  BP: 130/86  Pulse: 75  Temp: 98.4 F (36.9 C)  TempSrc: Oral  SpO2: 95%  Weight: 247 lb 9.6 oz (112.3 kg)  Height: _0  (1.676 m)  PainSc: 8   PainLoc: Chest   Body mass index is 39.96 kg/m.   Objective:  Physical Exam Vitals and nursing note reviewed.  Constitutional:      Appearance: Normal appearance.  HENT:     Head: Normocephalic and atraumatic.     Right  Ear: Tympanic membrane, ear canal and external ear normal.     Left Ear: Tympanic membrane, ear canal and external ear normal.     Nose:     Comments: Deferred, masked    Mouth/Throat:     Comments: Deferred, masked Cardiovascular:     Rate and Rhythm: Normal rate and regular rhythm.     Heart sounds: Normal heart sounds.  Pulmonary:     Effort: Pulmonary effort is normal.     Breath sounds: Normal breath sounds.  Skin:    General: Skin is warm.  Neurological:     General: No focal deficit present.     Mental Status: She is alert.  Psychiatric:        Mood and Affect: Mood normal.        Behavior: Behavior normal.         Assessment And Plan:     1. Nasal congestion  She does not appear to have an acute infection.  She was given kenalog, 55m IM x 1. She was also given samples Norel AD one tab po twice daily as needed x 2 days. She will let me know if her sx persist. She is advised to avoid dairy for the next week.   - triamcinolone acetonide (KENALOG-40) injection 60 mg  2. Class 2 severe obesity due to excess calories with serious comorbidity and body mass index (BMI) of 39.0 to 39.9 in adult ([facility]  She is encouraged to lose ten percent of her body weight by the end of the year.   RMaximino Greenland MD    THE PATIENT IS ENCOURAGED TO PRACTICE SOCIAL DISTANCING DUE TO THE COVID-19 PANDEMIC.

## 2019-12-28 ENCOUNTER — Telehealth: Payer: Self-pay

## 2019-12-28 ENCOUNTER — Other Ambulatory Visit: Payer: Self-pay

## 2019-12-28 ENCOUNTER — Ambulatory Visit: Payer: Self-pay

## 2019-12-28 DIAGNOSIS — E1165 Type 2 diabetes mellitus with hyperglycemia: Secondary | ICD-10-CM

## 2019-12-28 DIAGNOSIS — I10 Essential (primary) hypertension: Secondary | ICD-10-CM

## 2019-12-28 DIAGNOSIS — E785 Hyperlipidemia, unspecified: Secondary | ICD-10-CM

## 2019-12-29 NOTE — Chronic Care Management (AMB) (Signed)
  Chronic Care Management   Follow Up Note   12/29/2019 Name: [name] MRN: [id number] DOB: 02-02-1951  Referred by: Glendale Chard, MD Reason for referral : Chronic Care Management (RQ #3 Initial RN CM Call - DM, HLD, HTN)   LARKIN ALFRED is a 69 y.o. year old female who is a primary care patient of Glendale Chard, MD. The CCM team was consulted for assistance with chronic disease management and care coordination needs.    Review of patient status, including review of consultants reports, relevant laboratory and other test results, and collaboration with appropriate care team members and the patient's provider was performed as part of comprehensive patient evaluation and provision of chronic care management services.    SDOH (Social Determinants of Health) assessments performed: No See Care Plan activities for detailed interventions related to [facility])   Reviewed chart in preparation to contact patient. CCM RN CM will outreach following PCP DM f/u.     Outpatient Encounter Medications as of 12/28/2019  Medication Sig  . Alcohol Swabs (B-D SINGLE USE SWABS REGULAR) PADS Use as directed to check blood sugars 1 time per dx: e11.22  . Blood Glucose Calibration (TRUE METRIX LEVEL 1) Low SOLN Use as directed to check blood sugars 1 time per dx: e11.22  . Blood Glucose Monitoring Suppl (TRUE METRIX AIR GLUCOSE METER) w/Device KIT Inject 1 kit into the skin daily. Use as directed to check blood sugars 1 time per dx: e11.22  . Cholecalciferol (VITAMIN D) 2000 UNITS tablet Take 2,000 Units by mouth daily.   . diclofenac Sodium (VOLTAREN) 1 % GEL Place onto the skin.  Marland Kitchen glucose blood (TRUE METRIX BLOOD GLUCOSE TEST) test strip Use as directed to check blood sugars 1 time per dx: e11.22  . latanoprost (XALATAN) 0.005 % ophthalmic solution INSTILL 1 DROP INTO OU HS  . levocetirizine (XYZAL) 5 MG tablet Take 5 mg by mouth daily as needed for allergies.  . metFORMIN (GLUCOPHAGE-XR) 750 MG 24 hr tablet  TAKE 1 TABLET BY MOUTH  DAILY WITH EVENING MEAL  . Multiple Vitamin (MULTIVITAMIN WITH MINERALS) TABS tablet Take 1 tablet by mouth daily.  . Olmesartan-amLODIPine-HCTZ 40-5-12.5 MG TABS Take 1 tablet by mouth daily.  . pravastatin (PRAVACHOL) 40 MG tablet Take 1 tablet (40 mg total) by mouth every evening.  . Semaglutide,0.25 or 0.5MG/DOS, (OZEMPIC, 0.25 OR 0.5 MG/DOSE,) 2 MG/1.5ML SOPN Inject 0.5 mg into the skin once a week.  . Triamcinolone Acetonide (NASACORT AQ NA) Place into the nose.  . TRUEplus Lancets 33G MISC Use as directed to check blood sugars 1 time per dx: e11.22   No facility-administered encounter medications on file as of 12/28/2019.     Objective:  Lab Results  Component Value Date   HGBA1C 6.1 (H) 09/28/2019   HGBA1C 6.2 (H) 06/29/2019   HGBA1C 5.8 (H) 02/17/2019   Lab Results  Component Value Date   MICROALBUR 30 01/06/2019   LDLCALC 84 10/14/2018   CREATININE 0.77 09/28/2019   BP Readings from Last 3 Encounters:  12/01/19 130/86  09/28/19 128/88  06/29/19 114/68   Plan:   Telephone follow up appointment with care management team member scheduled for: 01/01/20  Barb Merino, RN, BSN, CCM Care Management Coordinator [place] [facility]  Direct Phone: [phone]

## 2019-12-30 ENCOUNTER — Other Ambulatory Visit: Payer: Self-pay

## 2019-12-30 ENCOUNTER — Encounter: Payer: Self-pay | Admitting: Internal Medicine

## 2019-12-30 ENCOUNTER — Ambulatory Visit (INDEPENDENT_AMBULATORY_CARE_PROVIDER_SITE_OTHER): Payer: Medicare PPO

## 2019-12-30 ENCOUNTER — Ambulatory Visit: Payer: Medicare PPO | Admitting: Internal Medicine

## 2019-12-30 VITALS — BP 126/78 | HR 75 | Temp 98.1°F | Ht 66.0 in | Wt 250.0 lb

## 2019-12-30 VITALS — BP 126/78 | HR 75 | Temp 98.1°F | Ht 66.0 in | Wt 250.6 lb

## 2019-12-30 DIAGNOSIS — Z0000 Encounter for general adult medical examination without abnormal findings: Secondary | ICD-10-CM

## 2019-12-30 DIAGNOSIS — E1165 Type 2 diabetes mellitus with hyperglycemia: Secondary | ICD-10-CM | POA: Diagnosis not present

## 2019-12-30 DIAGNOSIS — I10 Essential (primary) hypertension: Secondary | ICD-10-CM | POA: Diagnosis not present

## 2019-12-30 LAB — POCT URINALYSIS DIPSTICK
Blood, UA: NEGATIVE
Glucose, UA: NEGATIVE
Ketones, UA: NEGATIVE
Leukocytes, UA: NEGATIVE
Nitrite, UA: NEGATIVE
Protein, UA: NEGATIVE
Spec Grav, UA: >=1.03 — AB (ref 1.010–1.025)
Urobilinogen, UA: 0.2 E.U./dL
pH, UA: 5 (ref 5.0–8.0)

## 2019-12-30 LAB — POCT UA - MICROALBUMIN
Albumin/Creatinine Ratio, Urine, POC: 30
Creatinine, POC: 300 mg/dL
Microalbumin Ur, POC: 30 mg/L

## 2019-12-30 NOTE — Progress Notes (Signed)
This visit occurred during the SARS-CoV-2 public health emergency.  Safety protocols were in place, including screening questions prior to the visit, additional usage of staff PPE, and extensive cleaning of exam room while observing appropriate contact time as indicated for disinfecting solutions.  Subjective:   JAQUELINNE GLENDENING is a 69 y.o. female who presents for Medicare Annual (Subsequent) preventive examination.  Review of Systems:  n/a Cardiac Risk Factors include: advanced age (>72mn, >>34women);hypertension;obesity (BMI >30kg/m2)     Objective:     Vitals: BP 126/78 (BP Location: Left Arm, Patient Position: Sitting)   Pulse 75   Temp 98.1 F (36.7 C) (Oral)   Ht 5' 6"  (1.676 m)   Wt 250 lb (113.4 kg)   LMP 01/23/2013   BMI 40.35 kg/m   Body mass index is 40.35 kg/m.  Advanced Directives 12/30/2019 01/06/2019 04/23/2018 02/02/2013 01/30/2013  Does Patient Have a Medical Advance Directive? No No No Patient does not have advance directive Patient does not have advance directive  Would patient like information on creating a medical advance directive? Yes (MAU/Ambulatory/Procedural Areas - Information given) No - Patient declined Yes (MAU/Ambulatory/Procedural Areas - Information given) - -    Tobacco Social History   Tobacco Use  Smoking Status Never Smoker  Smokeless Tobacco Never Used     Counseling given: Not Answered   Clinical Intake:  Pre-visit preparation completed: Yes  Pain : No/denies pain     Nutritional Status: BMI > 30  Obese Nutritional Risks: None Diabetes: No  How often do you need to have someone help you when you read instructions, pamphlets, or other written materials from your doctor or pharmacy?: 1 - Never What is the last grade level you completed in school?: 2 years college  Interpreter Needed?: No  Information entered by :: NAllen LPN  Past Medical History:  Diagnosis Date  . Diabetes mellitus without complication ([facility]   . GERD  (gastroesophageal reflux disease)   . Headache(784.0)   . High cholesterol   . Hypertension   . Sleep apnea    mild- no CPAP  . Stroke ([facility] 2005   mini- stroke due to HTN   Past Surgical History:  Procedure Laterality Date  . CHEST TUBE INSERTION  2004   lung punctured during shoulder surgery  . FOOT SURGERY     bilateral foot surgery  . HYSTEROSCOPY WITH D & C N/A 02/02/2013   Procedure: DILATATION AND CURETTAGE /HYSTEROSCOPY;  Surgeon: EThurnell Lose MD;  [facility];  Service: Gynecology;  Laterality: N/A;  . KNEE ARTHROSCOPY    . SHOULDER ARTHROSCOPY    . TUBAL LIGATION     Family History  Problem Relation Age of Onset  . Cancer Mother   . Hypertension Mother   . Cancer Maternal Grandmother   . Hypertension Maternal Grandmother    Social History   Socioeconomic History  . Marital status: Single    Spouse name: Not on file  . Number of children: Not on file  . Years of education: Not on file  . Highest education level: Not on file  Occupational History  . Occupation: retired  Tobacco Use  . Smoking status: Never Smoker  . Smokeless tobacco: Never Used  Vaping Use  . Vaping Use: Never used  Substance and Sexual Activity  . Alcohol use: Yes    Comment: occassionally  . Drug use: No  . Sexual activity: Not Currently  Other Topics Concern  . Not on file  Social History Narrative  .  Not on file   Social Determinants of Health   Financial Resource Strain: Low Risk   . Difficulty of Paying Living Expenses: Not hard at all  Food Insecurity: No Food Insecurity  . Worried About Charity fundraiser in the Last Year: Never true  . Ran Out of Food in the Last Year: Never true  Transportation Needs: No Transportation Needs  . Lack of Transportation (Medical): No  . Lack of Transportation (Non-Medical): No  Physical Activity: Insufficiently Active  . Days of Exercise per Week: 4 days  . Minutes of Exercise per Session: 30 min  Stress: No Stress Concern  Present  . Feeling of Stress : Not at all  Social Connections:   . Frequency of Communication with Friends and Family:   . Frequency of Social Gatherings with Friends and Family:   . Attends Religious Services:   . Active Member of Clubs or Organizations:   . Attends Archivist Meetings:   Marland Kitchen Marital Status:     Outpatient Encounter Medications as of 12/30/2019  Medication Sig  . Alcohol Swabs (B-D SINGLE USE SWABS REGULAR) PADS Use as directed to check blood sugars 1 time per dx: e11.22  . Blood Glucose Calibration (TRUE METRIX LEVEL 1) Low SOLN Use as directed to check blood sugars 1 time per dx: e11.22  . Blood Glucose Monitoring Suppl (TRUE METRIX AIR GLUCOSE METER) w/Device KIT Inject 1 kit into the skin daily. Use as directed to check blood sugars 1 time per dx: e11.22  . Cholecalciferol (VITAMIN D) 2000 UNITS tablet Take 2,000 Units by mouth daily.   . diclofenac Sodium (VOLTAREN) 1 % GEL Place onto the skin.  Marland Kitchen glucose blood (TRUE METRIX BLOOD GLUCOSE TEST) test strip Use as directed to check blood sugars 1 time per dx: e11.22  . latanoprost (XALATAN) 0.005 % ophthalmic solution INSTILL 1 DROP INTO OU HS  . levocetirizine (XYZAL) 5 MG tablet Take 5 mg by mouth daily as needed for allergies.  . metFORMIN (GLUCOPHAGE-XR) 750 MG 24 hr tablet TAKE 1 TABLET BY MOUTH  DAILY WITH EVENING MEAL  . Multiple Vitamin (MULTIVITAMIN WITH MINERALS) TABS tablet Take 1 tablet by mouth daily.  . Olmesartan-amLODIPine-HCTZ 40-5-12.5 MG TABS Take 1 tablet by mouth daily.  . pravastatin (PRAVACHOL) 40 MG tablet Take 1 tablet (40 mg total) by mouth every evening.  . Semaglutide,0.25 or 0.5MG/DOS, (OZEMPIC, 0.25 OR 0.5 MG/DOSE,) 2 MG/1.5ML SOPN Inject 0.5 mg into the skin once a week.  . Triamcinolone Acetonide (NASACORT AQ NA) Place into the nose.  . TRUEplus Lancets 33G MISC Use as directed to check blood sugars 1 time per dx: e11.22   No facility-administered encounter medications on file as  of 12/30/2019.    Activities of Daily Living In your present state of health, do you have any difficulty performing the following activities: 12/30/2019 01/06/2019  Hearing? N N  Vision? N N  Difficulty concentrating or making decisions? N N  Walking or climbing stairs? N N  Dressing or bathing? N N  Doing errands, shopping? N N  Preparing Food and eating ? N N  Using the Toilet? N N  In the past six months, have you accidently leaked urine? N N  Do you have problems with loss of bowel control? N N  Managing your Medications? N N  Managing your Finances? N N  Housekeeping or managing your Housekeeping? N N  Some recent data might be hidden    Patient Care Team: [facility],  Bailey Mech, MD as PCP - General (Internal Medicine) Warden Fillers, MD as Consulting Physician (Ophthalmology)    Assessment:   This is a routine wellness examination for Sharol.  Exercise Activities and Dietary recommendations Current Exercise Habits: Structured exercise class, Type of exercise: calisthenics;strength training/weights, Time (Minutes): 30, Frequency (Times/Week): 4, Weekly Exercise (Minutes/Week): 120  Goals    .  DIET - REDUCE CALORIE INTAKE (pt-stated)      Patient is cutting down on breads, increasing water intake.    .  I would like to optimize my diabetes management (pt-stated)      Current Barriers:  . Diabetes: Y6VZ; complicated by chronic medical conditions including HTN, HLD, most recent A1c 6.1% on 09/29/19 (today) . Current antihyperglycemic regimen: Ozempic 0.24m weekly, metformin o Patient approved for assistance through [employer] program until 07/15/20.  Last refill to be sent in by 06/14/20.  Company to ship to PCP office ([phone]) o MD considering increasing Ozempic to [id number] for weight loss--will submit dose change form  . Denies hypoglycemic symptoms; Denies hyperglycemic symptoms . Current meal patterns: o Breakfast:eggs/grits/toast o Lunch:  sandwich, salads o Supper: protein/vegetable/carb o Drinks: water,coffee, tea, avoids sugary drinks, may have sips of juice occasionally  . Current exercise: walking, encouraged patient  . Current blood glucose readings: 100-110s . Cardiovascular risk reduction: o Current hypertensive regimen: olmesartan, HCTZ, amlodipine o Current hyperlipidemia regimen: pravastatin 64m60mily (LDL 84 on 10/14/18--needs repeat LDL) o Current antiplatelet regimen: n/a  Pharmacist Clinical Goal(s):  . OvMarland Kitchenr the next 90 days, patient will work with PharmD and primary care provider to address needs related to diabetes management  Interventions: . Comprehensive medication review performed, medication list updated in electronic medical record . Reviewed & discussed the following diabetes-related information with patient o Follow ADA recommended "diabetes-friendly" diet  (reviewed healthy snack/food options) o Discussed GLP-1 injection technique o Reviewed medication purpose/side effects  Patient Self Care Activities:  . Patient will check blood glucose 3x weekly and as needed if symptomatic , document, and provide at future appointments . Patient will focus on medication adherence . Patient will take medications as prescribed . Patient will contact provider with any episodes of hypoglycemia . Patient will report any questions or concerns to provider   Please see past updates related to this goal by clicking on the "Past Updates" button in the selected goal      .  Patient Stated      12/30/2019, wants to get to 218 pounds    .  Weight (lb) < 200 lb (90.7 kg) (pt-stated)       Fall Risk Fall Risk  12/30/2019 02/17/2019 01/06/2019 10/14/2018 07/30/2018  Falls in the past year? 0 0 0 0 0  Risk for fall due to : Medication side effect - Medication side effect - -  Follow up Falls evaluation completed;Education provided;Falls prevention discussed - Education provided;Falls prevention discussed - -   Is the  patient's home free of loose throw rugs in walkways, pet beds, electrical cords, etc?   yes      Grab bars in the bathroom? yes      Handrails on the stairs?   yes      Adequate lighting?   yes  Timed Get Up and Go performed: n/a  Depression Screen PHQ 2/9 Scores 12/30/2019 01/06/2019 10/14/2018 07/02/2018  PHQ - 2 Score 0 0 0 0  PHQ- 9 Score 0 0 - -     Cognitive Function  6CIT Screen 12/30/2019 01/06/2019 04/23/2018  What Year? 0 points 0 points 0 points  What month? 0 points 0 points 0 points  What time? 0 points 0 points 0 points  Count back from 20 0 points 0 points 0 points  Months in reverse 0 points 0 points 0 points  Repeat phrase 0 points 0 points 0 points  Total Score 0 0 0    Immunization History  Administered Date(s) Administered  . Influenza, High Dose Seasonal PF 04/23/2018, 06/29/2019, 08/12/2019  . PFIZER SARS-COV-2 Vaccination 09/25/2019, 10/16/2019  . Pneumococcal Conjugate-13 09/04/2019  . Pneumococcal Polysaccharide-23 04/23/2018  . Tdap 12/26/2017    Qualifies for Shingles Vaccine? yes  Screening Tests Health Maintenance  Topic Date Due  . INFLUENZA VACCINE  02/14/2020  . MAMMOGRAM  06/29/2020  . COLONOSCOPY  10/30/2022  . TETANUS/TDAP  12/27/2027  . DEXA SCAN  Completed  . COVID-19 Vaccine  Completed  . Hepatitis C Screening  Completed  . PNA vac Low Risk Adult  Completed    Cancer Screenings: Lung: Low Dose CT Chest recommended if Age 44-80 years, 30 pack-year currently smoking OR have quit w/in 15years. Patient does not qualify. Breast:  Up to date on Mammogram? Yes   Up to date of Bone Density/Dexa? Yes Colorectal: up to date  Additional Screenings: : Hepatitis C Screening: 09/09/2012     Plan:    Patient wants to get down to 218 pounds.   I have personally reviewed and noted the following in the patient's chart:   . Medical and social history . Use of alcohol, tobacco or illicit drugs  . Current medications and  supplements . Functional ability and status . Nutritional status . Physical activity . Advanced directives . List of other physicians . Hospitalizations, surgeries, and ER visits in previous 12 months . Vitals . Screenings to include cognitive, depression, and falls . Referrals and appointments  In addition, I have reviewed and discussed with patient certain preventive protocols, quality metrics, and best practice recommendations. A written personalized care plan for preventive services as well as general preventive health recommendations were provided to patient.     Kellie Simmering, LPN  09/01/4713

## 2019-12-30 NOTE — Patient Instructions (Signed)
[name] , Thank you for taking time to come for your Medicare Wellness Visit. I appreciate your ongoing commitment to your health goals. Please review the following plan we discussed and let me know if I can assist you in the future.   Screening recommendations/referrals: Colonoscopy: completed 10/29/2012. Due 10/30/2022 Mammogram: completed 06/30/2019. Due 06/29/2020 Bone Density: completed 05/28/2016 Recommended yearly ophthalmology/optometry visit for glaucoma screening and checkup Recommended yearly dental visit for hygiene and checkup  Vaccinations: Influenza vaccine: completed 08/12/2019. Due 02/14/2020 Pneumococcal vaccine: completed 09/04/2019 Tdap vaccine: completed 12/26/2017. Due 12/27/2027 Shingles vaccine: discussed   Covid-19:completed 10/16/2019, 09/25/2019  Advanced directives: Advance directive discussed with you today. I have provided a copy for you to complete at home and have notarized. Once this is complete please bring a copy in to our office so we can scan it into your chart.  Conditions/risks identified: obesity  Next appointment: Follow up in one year for your annual wellness visit 01/12/2021 at 2:00   Preventive Care 65 Years and Older, Female Preventive care refers to lifestyle choices and visits with your health care provider that can promote health and wellness. What does preventive care include?  A yearly physical exam. This is also called an annual well check.  Dental exams once or twice a year.  Routine eye exams. Ask your health care provider how often you should have your eyes checked.  Personal lifestyle choices, including:  Daily care of your teeth and gums.  Regular physical activity.  Eating a healthy diet.  Avoiding tobacco and drug use.  Limiting alcohol use.  Practicing safe sex.  Taking low-dose aspirin every day.  Taking vitamin and mineral supplements as recommended by your health care provider. What happens during an annual well  check? The services and screenings done by your health care provider during your annual well check will depend on your age, overall health, lifestyle risk factors, and family history of disease. Counseling  Your health care provider may ask you questions about your:  Alcohol use.  Tobacco use.  Drug use.  Emotional well-being.  Home and relationship well-being.  Sexual activity.  Eating habits.  History of falls.  Memory and ability to understand (cognition).  Work and work Statistician.  Reproductive health. Screening  You may have the following tests or measurements:  Height, weight, and BMI.  Blood pressure.  Lipid and cholesterol levels. These may be checked every 5 years, or more frequently if you are over 49 years old.  Skin check.  Lung cancer screening. You may have this screening every year starting at age 53 if you have a 30-pack-year history of smoking and currently smoke or have quit within the past 15 years.  Fecal occult blood test (FOBT) of the stool. You may have this test every year starting at age 47.  Flexible sigmoidoscopy or colonoscopy. You may have a sigmoidoscopy every 5 years or a colonoscopy every 10 years starting at age 59.  Hepatitis C blood test.  Hepatitis B blood test.  Sexually transmitted disease (STD) testing.  Diabetes screening. This is done by checking your blood sugar (glucose) after you have not eaten for a while (fasting). You may have this done every 1-3 years.  Bone density scan. This is done to screen for osteoporosis. You may have this done starting at age 51.  Mammogram. This may be done every 1-2 years. Talk to your health care provider about how often you should have regular mammograms. Talk with your health care provider about  your test results, treatment options, and if necessary, the need for more tests. Vaccines  Your health care provider may recommend certain vaccines, such as:  Influenza vaccine. This is  recommended every year.  Tetanus, diphtheria, and acellular pertussis (Tdap, Td) vaccine. You may need a Td booster every 10 years.  Zoster vaccine. You may need this after age 69.  Pneumococcal 13-valent conjugate (PCV13) vaccine. One dose is recommended after age 63.  Pneumococcal polysaccharide (PPSV23) vaccine. One dose is recommended after age 77. Talk to your health care provider about which screenings and vaccines you need and how often you need them. This information is not intended to replace advice given to you by your health care provider. Make sure you discuss any questions you have with your health care provider. Document Released: 07/29/2015 Document Revised: 03/21/2016 Document Reviewed: 05/03/2015 Elsevier Interactive Patient Education  2017 [employer] Prevention in the Home Falls can cause injuries. They can happen to people of all ages. There are many things you can do to make your home safe and to help prevent falls. What can I do on the outside of my home?  Regularly fix the edges of walkways and driveways and fix any cracks.  Remove anything that might make you trip as you walk through a door, such as a raised step or threshold.  Trim any bushes or trees on the path to your home.  Use bright outdoor lighting.  Clear any walking paths of anything that might make someone trip, such as rocks or tools.  Regularly check to see if handrails are loose or broken. Make sure that both sides of any steps have handrails.  Any raised decks and porches should have guardrails on the edges.  Have any leaves, snow, or ice cleared regularly.  Use sand or salt on walking paths during winter.  Clean up any spills in your garage right away. This includes oil or grease spills. What can I do in the bathroom?  Use night lights.  Install grab bars by the toilet and in the tub and shower. Do not use towel bars as grab bars.  Use non-skid mats or decals in the tub or  shower.  If you need to sit down in the shower, use a plastic, non-slip stool.  Keep the floor dry. Clean up any water that spills on the floor as soon as it happens.  Remove soap buildup in the tub or shower regularly.  Attach bath mats securely with double-sided non-slip rug tape.  Do not have throw rugs and other things on the floor that can make you trip. What can I do in the bedroom?  Use night lights.  Make sure that you have a light by your bed that is easy to reach.  Do not use any sheets or blankets that are too big for your bed. They should not hang down onto the floor.  Have a firm chair that has side arms. You can use this for support while you get dressed.  Do not have throw rugs and other things on the floor that can make you trip. What can I do in the kitchen?  Clean up any spills right away.  Avoid walking on wet floors.  Keep items that you use a lot in easy-to-reach places.  If you need to reach something above you, use a strong step stool that has a grab bar.  Keep electrical cords out of the way.  Do not use floor polish or wax  that makes floors slippery. If you must use wax, use non-skid floor wax.  Do not have throw rugs and other things on the floor that can make you trip. What can I do with my stairs?  Do not leave any items on the stairs.  Make sure that there are handrails on both sides of the stairs and use them. Fix handrails that are broken or loose. Make sure that handrails are as long as the stairways.  Check any carpeting to make sure that it is firmly attached to the stairs. Fix any carpet that is loose or worn.  Avoid having throw rugs at the top or bottom of the stairs. If you do have throw rugs, attach them to the floor with carpet tape.  Make sure that you have a light switch at the top of the stairs and the bottom of the stairs. If you do not have them, ask someone to add them for you. What else can I do to help prevent  falls?  Wear shoes that:  Do not have high heels.  Have rubber bottoms.  Are comfortable and fit you well.  Are closed at the toe. Do not wear sandals.  If you use a stepladder:  Make sure that it is fully opened. Do not climb a closed stepladder.  Make sure that both sides of the stepladder are locked into place.  Ask someone to hold it for you, if possible.  Clearly mark and make sure that you can see:  Any grab bars or handrails.  First and last steps.  Where the edge of each step is.  Use tools that help you move around (mobility aids) if they are needed. These include:  Canes.  Walkers.  Scooters.  Crutches.  Turn on the lights when you go into a dark area. Replace any light bulbs as soon as they burn out.  Set up your furniture so you have a clear path. Avoid moving your furniture around.  If any of your floors are uneven, fix them.  If there are any pets around you, be aware of where they are.  Review your medicines with your doctor. Some medicines can make you feel dizzy. This can increase your chance of falling. Ask your doctor what other things that you can do to help prevent falls. This information is not intended to replace advice given to you by your health care provider. Make sure you discuss any questions you have with your health care provider. Document Released: 04/28/2009 Document Revised: 12/08/2015 Document Reviewed: 08/06/2014 Elsevier Interactive Patient Education  2017 [employer].

## 2019-12-30 NOTE — Patient Instructions (Signed)
Health Maintenance, Female Adopting a healthy lifestyle and getting preventive care are important in promoting health and wellness. Ask your health care provider about:  The right schedule for you to have regular tests and exams.  Things you can do on your own to prevent diseases and keep yourself healthy. What should I know about diet, weight, and exercise? Eat a healthy diet   Eat a diet that includes plenty of vegetables, fruits, low-fat dairy products, and lean protein.  Do not eat a lot of foods that are high in solid fats, added sugars, or sodium. Maintain a healthy weight Body mass index (BMI) is used to identify weight problems. It estimates body fat based on height and weight. Your health care provider can help determine your BMI and help you achieve or maintain a healthy weight. Get regular exercise Get regular exercise. This is one of the most important things you can do for your health. Most adults should:  Exercise for at least 150 minutes each week. The exercise should increase your heart rate and make you sweat (moderate-intensity exercise).  Do strengthening exercises at least twice a week. This is in addition to the moderate-intensity exercise.  Spend less time sitting. Even light physical activity can be beneficial. Watch cholesterol and blood lipids Have your blood tested for lipids and cholesterol at 69 years of age, then have this test every 5 years. Have your cholesterol levels checked more often if:  Your lipid or cholesterol levels are high.  You are older than 69 years of age.  You are at high risk for heart disease. What should I know about cancer screening? Depending on your health history and family history, you may need to have cancer screening at various ages. This may include screening for:  Breast cancer.  Cervical cancer.  Colorectal cancer.  Skin cancer.  Lung cancer. What should I know about heart disease, diabetes, and high blood  pressure? Blood pressure and heart disease  High blood pressure causes heart disease and increases the risk of stroke. This is more likely to develop in people who have high blood pressure readings, are of African descent, or are overweight.  Have your blood pressure checked: ? Every 3-5 years if you are 18-39 years of age. ? Every year if you are 40 years old or older. Diabetes Have regular diabetes screenings. This checks your fasting blood sugar level. Have the screening done:  Once every three years after age 40 if you are at a normal weight and have a low risk for diabetes.  More often and at a younger age if you are overweight or have a high risk for diabetes. What should I know about preventing infection? Hepatitis B If you have a higher risk for hepatitis B, you should be screened for this virus. Talk with your health care provider to find out if you are at risk for hepatitis B infection. Hepatitis C Testing is recommended for:  Everyone born from 1945 through 1965.  Anyone with known risk factors for hepatitis C. Sexually transmitted infections (STIs)  Get screened for STIs, including gonorrhea and chlamydia, if: ? You are sexually active and are younger than 69 years of age. ? You are older than 69 years of age and your health care provider tells you that you are at risk for this type of infection. ? Your sexual activity has changed since you were last screened, and you are at increased risk for chlamydia or gonorrhea. Ask your health care provider if   you are at risk.  Ask your health care provider about whether you are at high risk for HIV. Your health care provider may recommend a prescription medicine to help prevent HIV infection. If you choose to take medicine to prevent HIV, you should first get tested for HIV. You should then be tested every 3 months for as long as you are taking the medicine. Pregnancy  If you are about to stop having your period (premenopausal) and  you may become pregnant, seek counseling before you get pregnant.  Take 400 to 800 micrograms (mcg) of folic acid every day if you become pregnant.  Ask for birth control (contraception) if you want to prevent pregnancy. Osteoporosis and menopause Osteoporosis is a disease in which the bones lose minerals and strength with aging. This can result in bone fractures. If you are 65 years old or older, or if you are at risk for osteoporosis and fractures, ask your health care provider if you should:  Be screened for bone loss.  Take a calcium or vitamin D supplement to lower your risk of fractures.  Be given hormone replacement therapy (HRT) to treat symptoms of menopause. Follow these instructions at home: Lifestyle  Do not use any products that contain nicotine or tobacco, such as cigarettes, e-cigarettes, and chewing tobacco. If you need help quitting, ask your health care provider.  Do not use street drugs.  Do not share needles.  Ask your health care provider for help if you need support or information about quitting drugs. Alcohol use  Do not drink alcohol if: ? Your health care provider tells you not to drink. ? You are pregnant, may be pregnant, or are planning to become pregnant.  If you drink alcohol: ? Limit how much you use to 0-1 drink a day. ? Limit intake if you are breastfeeding.  Be aware of how much alcohol is in your drink. In the [place] one drink equals one 12 oz bottle of beer (355 mL), one 5 oz glass of wine (148 mL), or one 1 oz glass of hard liquor (44 mL). General instructions  Schedule regular health, dental, and eye exams.  Stay current with your vaccines.  Tell your health care provider if: ? You often feel depressed. ? You have ever been abused or do not feel safe at home. Summary  Adopting a healthy lifestyle and getting preventive care are important in promoting health and wellness.  Follow your health care provider's instructions about healthy  diet, exercising, and getting tested or screened for diseases.  Follow your health care provider's instructions on monitoring your cholesterol and blood pressure. This information is not intended to replace advice given to you by your health care provider. Make sure you discuss any questions you have with your health care provider. Document Revised: 06/25/2018 Document Reviewed: 06/25/2018 Elsevier Patient Education  2020 Elsevier Inc.  

## 2019-12-31 LAB — CMP14+EGFR
ALT: 9 IU/L (ref 0–32)
AST: 17 IU/L (ref 0–40)
Albumin/Globulin Ratio: 1.5 (ref 1.2–2.2)
Albumin: 4.1 g/dL (ref 3.8–4.8)
Alkaline Phosphatase: 104 IU/L (ref 48–121)
BUN/Creatinine Ratio: 29 — ABNORMAL HIGH (ref 12–28)
BUN: 22 mg/dL (ref 8–27)
Bilirubin Total: 0.3 mg/dL (ref 0.0–1.2)
CO2: 25 mmol/L (ref 20–29)
Calcium: 9.2 mg/dL (ref 8.7–10.3)
Chloride: 102 mmol/L (ref 96–106)
Creatinine, Ser: 0.75 mg/dL (ref 0.57–1.00)
GFR calc Af Amer: 94 mL/min/{1.73_m2} (ref >59)
GFR calc non Af Amer: 82 mL/min/{1.73_m2} (ref >59)
Globulin, Total: 2.8 g/dL (ref 1.5–4.5)
Glucose: 96 mg/dL (ref 65–99)
Potassium: 3.4 mmol/L — ABNORMAL LOW (ref 3.5–5.2)
Sodium: 141 mmol/L (ref 134–144)
Total Protein: 6.9 g/dL (ref 6.0–8.5)

## 2019-12-31 LAB — HEMOGLOBIN A1C
Est. average glucose Bld gHb Est-mCnc: 128 mg/dL
Hgb A1c MFr Bld: 6.1 % — ABNORMAL HIGH (ref 4.8–5.6)

## 2019-12-31 LAB — CBC
Hematocrit: 44.4 % (ref 34.0–46.6)
Hemoglobin: 14.4 g/dL (ref 11.1–15.9)
MCH: 25.9 pg — ABNORMAL LOW (ref 26.6–33.0)
MCHC: 32.4 g/dL (ref 31.5–35.7)
MCV: 80 fL (ref 79–97)
Platelets: 331 10*3/uL (ref 150–450)
RBC: 5.55 x10E6/uL — ABNORMAL HIGH (ref 3.77–5.28)
RDW: 14 % (ref 11.7–15.4)
WBC: 6.5 10*3/uL (ref 3.4–10.8)

## 2019-12-31 LAB — LIPID PANEL
Chol/HDL Ratio: 3.1 ratio (ref 0.0–4.4)
Cholesterol, Total: 188 mg/dL (ref 100–199)
HDL: 61 mg/dL (ref >39)
LDL Chol Calc (NIH): 107 mg/dL — ABNORMAL HIGH (ref 0–99)
Triglycerides: 111 mg/dL (ref 0–149)
VLDL Cholesterol Cal: 20 mg/dL (ref 5–40)

## 2019-12-31 NOTE — Progress Notes (Signed)
This visit occurred during the SARS-CoV-2 public health emergency.  Safety protocols were in place, including screening questions prior to the visit, additional usage of staff PPE, and extensive cleaning of exam room while observing appropriate contact time as indicated for disinfecting solutions.  Subjective:     Patient ID: [name] , female    DOB: [birth year] , 69 y.o.   MRN: [id number]   Chief Complaint  Patient presents with  . Annual Exam  . Diabetes  . Hypertension    HPI  She is here today for a full physical examination. She is no longer followed by GYN. She has had her mammogram this year.   Diabetes She presents for her follow-up diabetic visit. She has type 2 diabetes mellitus. Her disease course has been improving. There are no hypoglycemic associated symptoms. There are no diabetic associated symptoms. Pertinent negatives for diabetes include no blurred vision and no chest pain. There are no hypoglycemic complications. There are no diabetic complications. Risk factors for coronary artery disease include diabetes mellitus, hypertension, obesity and post-menopausal. She is compliant with treatment most of the time. Her breakfast blood glucose is taken between 8-9 am. Her breakfast blood glucose range is generally 70-90 mg/dl. Eye exam is current.  Hypertension This is a chronic problem. The current episode started more than 1 year ago. The problem is unchanged. The problem is controlled. Pertinent negatives include no blurred vision, chest pain, neck pain, orthopnea, palpitations, PND or shortness of breath.     Past Medical History:  Diagnosis Date  . Diabetes mellitus without complication ([place])   . GERD (gastroesophageal reflux disease)   . Headache(784.0)   . High cholesterol   . Hypertension   . Sleep apnea    mild- no CPAP  . Stroke [facility]) 2005   mini- stroke due to HTN     Family History  Problem Relation Age of Onset  . Cancer Mother   . Hypertension  Mother   . Cancer Maternal Grandmother   . Hypertension Maternal Grandmother      Current Outpatient Medications:  .  Alcohol Swabs (B-D SINGLE USE SWABS REGULAR) PADS, Use as directed to check blood sugars 1 time per dx: e11.22, Disp: 200 each, Rfl: 3 .  Blood Glucose Calibration (TRUE METRIX LEVEL 1) Low SOLN, Use as directed to check blood sugars 1 time per dx: e11.22, Disp: 2 each, Rfl: 2 .  Blood Glucose Monitoring Suppl (TRUE METRIX AIR GLUCOSE METER) w/Device KIT, Inject 1 kit into the skin daily. Use as directed to check blood sugars 1 time per dx: e11.22, Disp: 1 kit, Rfl: 1 .  Cholecalciferol (VITAMIN D) 2000 UNITS tablet, Take 2,000 Units by mouth daily. , Disp: , Rfl:  .  diclofenac Sodium (VOLTAREN) 1 % GEL, Place onto the skin., Disp: , Rfl:  .  glucose blood (TRUE METRIX BLOOD GLUCOSE TEST) test strip, Use as directed to check blood sugars 1 time per dx: e11.22, Disp: 200 each, Rfl: 3 .  latanoprost (XALATAN) 0.005 % ophthalmic solution, INSTILL 1 DROP INTO OU HS, Disp: , Rfl:  .  levocetirizine (XYZAL) 5 MG tablet, Take 5 mg by mouth daily as needed for allergies., Disp: , Rfl:  .  metFORMIN (GLUCOPHAGE-XR) 750 MG 24 hr tablet, TAKE 1 TABLET BY MOUTH  DAILY WITH EVENING MEAL, Disp: 90 tablet, Rfl: 3 .  Multiple Vitamin (MULTIVITAMIN WITH MINERALS) TABS tablet, Take 1 tablet by mouth daily., Disp: , Rfl:  .  Olmesartan-amLODIPine-HCTZ 40-5-12.5 MG  TABS, Take 1 tablet by mouth daily., Disp: 90 tablet, Rfl: 3 .  pravastatin (PRAVACHOL) 40 MG tablet, Take 1 tablet (40 mg total) by mouth every evening., Disp: 45 tablet, Rfl: 3 .  Semaglutide,0.25 or 0.5MG/DOS, (OZEMPIC, 0.25 OR 0.5 MG/DOSE,) 2 MG/1.5ML SOPN, Inject 0.5 mg into the skin once a week., Disp: 3 pen, Rfl: 3 .  Triamcinolone Acetonide (NASACORT AQ NA), Place into the nose., Disp: , Rfl:  .  TRUEplus Lancets 33G MISC, Use as directed to check blood sugars 1 time per dx: e11.22, Disp: 200 each, Rfl: 3   Allergies  Allergen  Reactions  . Codeine Nausea And Vomiting and Diarrhea  . Meperidine     Other reaction(s): Delusions (intolerance)     The patient states she uses post menopausal status for birth control. Last LMP was Patient's last menstrual period was 01/23/2013.. Negative for Dysmenorrhea Negative for: breast discharge, breast lump(s), breast pain and breast self exam. Associated symptoms include abnormal vaginal bleeding. Pertinent negatives include abnormal bleeding (hematology), anxiety, decreased libido, depression, difficulty falling sleep, dyspareunia, history of infertility, nocturia, sexual dysfunction, sleep disturbances, urinary incontinence, urinary urgency, vaginal discharge and vaginal itching. Diet regular.The patient states her exercise level is    . The patient's tobacco use is:  Social History   Tobacco Use  Smoking Status Never Smoker  Smokeless Tobacco Never Used  . She has been exposed to passive smoke. The patient's alcohol use is:  Social History   Substance and Sexual Activity  Alcohol Use Yes   Comment: occassionally    Review of Systems  Constitutional: Negative.   HENT: Negative.   Eyes: Negative.  Negative for blurred vision.  Respiratory: Negative.  Negative for shortness of breath.   Cardiovascular: Negative.  Negative for chest pain, palpitations, orthopnea and PND.  Endocrine: Negative.   Genitourinary: Negative.   Musculoskeletal: Negative.  Negative for neck pain.  Skin: Negative.   Allergic/Immunologic: Negative.   Neurological: Negative.   Hematological: Negative.   Psychiatric/Behavioral: Negative.      Today's Vitals   12/30/19 1438  BP: 126/78  Pulse: 75  Temp: 98.1 F (36.7 C)  TempSrc: Oral  Weight: 250 lb 9.6 oz (113.7 kg)  Height: 5' 6"  (1.676 m)  PainSc: 0-No pain   Body mass index is 40.45 kg/m.   Objective:  Physical Exam Vitals and nursing note reviewed.  Constitutional:      Appearance: Normal appearance.  HENT:     Head:  Normocephalic and atraumatic.     Right Ear: Tympanic membrane, ear canal and external ear normal.     Left Ear: Tympanic membrane, ear canal and external ear normal.     Nose:     Comments: Deferred, masked    Mouth/Throat:     Comments: Deferred, masked Eyes:     Extraocular Movements: Extraocular movements intact.     Conjunctiva/sclera: Conjunctivae normal.     Pupils: Pupils are equal, round, and reactive to light.  Cardiovascular:     Rate and Rhythm: Normal rate and regular rhythm.     Pulses: Normal pulses.          Dorsalis pedis pulses are 2+ on the right side and 2+ on the left side.     Heart sounds: Normal heart sounds.  Pulmonary:     Effort: Pulmonary effort is normal.     Breath sounds: Normal breath sounds.  Chest:     Breasts: Tanner Score is 5.  Right: Normal.        Left: Normal.  Abdominal:     General: Bowel sounds are normal.     Palpations: Abdomen is soft.     Comments: Rounded, soft. Difficult to assess organomegaly.   Genitourinary:    Comments: deferred Musculoskeletal:        General: Normal range of motion.     Cervical back: Normal range of motion and neck supple.  Feet:     Right foot:     Protective Sensation: 5 sites tested. 5 sites sensed.     Skin integrity: Skin integrity normal.     Toenail Condition: Right toenails are normal.     Left foot:     Protective Sensation: 5 sites tested. 5 sites sensed.     Skin integrity: Skin integrity normal.     Toenail Condition: Left toenails are normal.  Skin:    General: Skin is warm and dry.  Neurological:     General: No focal deficit present.     Mental Status: She is alert and oriented to person, place, and time.  Psychiatric:        Mood and Affect: Mood normal.        Behavior: Behavior normal.         Assessment And Plan:     1. Routine general medical examination at health care facility  A full exam was performed.  Importance of monthly self breast exams was discussed  with the patient. PATIENT IS ADVISED TO GET 30-45 MINUTES REGULAR EXERCISE NO LESS THAN FOUR TO FIVE DAYS PER WEEK - BOTH WEIGHTBEARING EXERCISES AND AEROBIC ARE RECOMMENDED.  SHE IS ADVISED TO FOLLOW A HEALTHY DIET WITH AT LEAST SIX FRUITS/VEGGIES PER DAY, DECREASE INTAKE OF RED MEAT, AND TO INCREASE FISH INTAKE TO TWO DAYS PER WEEK.  MEATS/FISH SHOULD NOT BE FRIED, BAKED OR BROILED IS PREFERABLE.  I SUGGEST WEARING SPF 50 SUNSCREEN ON EXPOSED PARTS AND ESPECIALLY WHEN IN THE DIRECT SUNLIGHT FOR AN EXTENDED PERIOD OF TIME.  PLEASE AVOID FAST FOOD RESTAURANTS AND INCREASE YOUR WATER INTAKE.   2. Type 2 diabetes mellitus with hyperglycemia, without long-term current use of insulin ([place])  Diabetic foot exam was performed. She will rto in four months for re-evaluation.  I DISCUSSED WITH THE PATIENT AT LENGTH REGARDING THE GOALS OF GLYCEMIC CONTROL AND POSSIBLE LONG-TERM COMPLICATIONS.  I  ALSO STRESSED THE IMPORTANCE OF COMPLIANCE WITH HOME GLUCOSE MONITORING, DIETARY RESTRICTIONS INCLUDING AVOIDANCE OF SUGARY DRINKS/PROCESSED FOODS,  ALONG WITH REGULAR EXERCISE.  I  ALSO STRESSED THE IMPORTANCE OF ANNUAL EYE EXAMS, SELF FOOT CARE AND COMPLIANCE WITH OFFICE VISITS.  - CMP14+EGFR - CBC - Lipid panel - Hemoglobin A1c - POCT Urinalysis Dipstick (81002) - POCT UA - Microalbumin  3. Essential hypertension, benign  Chronic, well controlled.  She will continue with current meds. She is encouraged to avoid adding salt to her foods. EKG performed, NSR w/o acute changes.   - EKG 12-Lead        [name], MD    THE PATIENT IS ENCOURAGED TO PRACTICE SOCIAL DISTANCING DUE TO THE COVID-19 PANDEMIC.

## 2020-01-01 ENCOUNTER — Telehealth: Payer: Self-pay

## 2020-01-01 ENCOUNTER — Other Ambulatory Visit: Payer: Self-pay

## 2020-01-01 ENCOUNTER — Ambulatory Visit (INDEPENDENT_AMBULATORY_CARE_PROVIDER_SITE_OTHER): Payer: Medicare PPO

## 2020-01-01 DIAGNOSIS — E1165 Type 2 diabetes mellitus with hyperglycemia: Secondary | ICD-10-CM

## 2020-01-01 DIAGNOSIS — I10 Essential (primary) hypertension: Secondary | ICD-10-CM | POA: Diagnosis not present

## 2020-01-01 DIAGNOSIS — E785 Hyperlipidemia, unspecified: Secondary | ICD-10-CM | POA: Diagnosis not present

## 2020-01-04 NOTE — Chronic Care Management (AMB) (Signed)
  Chronic Care Management   Outreach Note  01/04/2020 Name: [name] MRN: [id number] DOB: July 18, 1950  Referred by: Glendale Chard, MD Reason for referral : Chronic Care Management (RQ #3 Initial RN CM Call - DM, HLD, HTN)   Third unsuccessful telephone outreach was attempted today. The patient was referred to the case management team for assistance with care management and care coordination. The patient's primary care provider has been notified of our unsuccessful attempts to make or maintain contact with the patient. The care management team is pleased to engage with this patient at any time in the future should he/she be interested in assistance from the care management team.   Follow Up Plan:  The care management team is available to follow up with the patient after provider conversation with the patient regarding recommendation for care management engagement and subsequent re-referral to the care management team.   Barb Merino, RN, BSN, CCM Care Management Coordinator [place] [facility]  Direct Phone: [phone]

## 2020-01-11 ENCOUNTER — Other Ambulatory Visit: Payer: Self-pay

## 2020-01-11 ENCOUNTER — Ambulatory Visit: Payer: Self-pay

## 2020-01-11 ENCOUNTER — Telehealth: Payer: Self-pay

## 2020-01-11 DIAGNOSIS — I10 Essential (primary) hypertension: Secondary | ICD-10-CM

## 2020-01-11 DIAGNOSIS — E785 Hyperlipidemia, unspecified: Secondary | ICD-10-CM

## 2020-01-11 DIAGNOSIS — E1165 Type 2 diabetes mellitus with hyperglycemia: Secondary | ICD-10-CM

## 2020-01-12 NOTE — Chronic Care Management (AMB) (Signed)
  Chronic Care Management   Follow Up Note   01/12/2020 Name: [name] MRN: [id number] DOB: 11-28-1950  Referred by: Glendale Chard, MD Reason for referral : Chronic Care Management (FU RN CM Case Closure )   [name] is a 69 y.o. year old female who is a primary care patient of Glendale Chard, MD. The CCM team was consulted for assistance with chronic disease management and care coordination needs.    Review of patient status, including review of consultants reports, relevant laboratory and other test results, and collaboration with appropriate care team members and the patient's provider was performed as part of comprehensive patient evaluation and provision of chronic care management services.    SDOH (Social Determinants of Health) assessments performed: No See Care Plan activities for detailed interventions related to [facility])    The care management team is available to follow up with the patient after provider conversation with the patient regarding recommendation for care management engagement and subsequent re-referral to the care management team.    Outpatient Encounter Medications as of 01/11/2020  Medication Sig  . Alcohol Swabs (B-D SINGLE USE SWABS REGULAR) PADS Use as directed to check blood sugars 1 time per dx: e11.22  . Blood Glucose Calibration (TRUE METRIX LEVEL 1) Low SOLN Use as directed to check blood sugars 1 time per dx: e11.22  . Blood Glucose Monitoring Suppl (TRUE METRIX AIR GLUCOSE METER) w/Device KIT Inject 1 kit into the skin daily. Use as directed to check blood sugars 1 time per dx: e11.22  . Cholecalciferol (VITAMIN D) 2000 UNITS tablet Take 2,000 Units by mouth daily.   . diclofenac Sodium (VOLTAREN) 1 % GEL Place onto the skin.  Marland Kitchen glucose blood (TRUE METRIX BLOOD GLUCOSE TEST) test strip Use as directed to check blood sugars 1 time per dx: e11.22  . latanoprost (XALATAN) 0.005 % ophthalmic solution INSTILL 1 DROP INTO OU HS  . levocetirizine (XYZAL)  5 MG tablet Take 5 mg by mouth daily as needed for allergies.  . metFORMIN (GLUCOPHAGE-XR) 750 MG 24 hr tablet TAKE 1 TABLET BY MOUTH  DAILY WITH EVENING MEAL  . Multiple Vitamin (MULTIVITAMIN WITH MINERALS) TABS tablet Take 1 tablet by mouth daily.  . Olmesartan-amLODIPine-HCTZ 40-5-12.5 MG TABS Take 1 tablet by mouth daily.  . pravastatin (PRAVACHOL) 40 MG tablet Take 1 tablet (40 mg total) by mouth every evening.  . Semaglutide,0.25 or 0.'5MG'$ /DOS, (OZEMPIC, 0.25 OR 0.5 MG/DOSE,) 2 MG/1.5ML SOPN Inject 0.5 mg into the skin once a week.  . Triamcinolone Acetonide (NASACORT AQ NA) Place into the nose.  . TRUEplus Lancets 33G MISC Use as directed to check blood sugars 1 time per dx: e11.22   No facility-administered encounter medications on file as of 01/11/2020.     Objective:  Lab Results  Component Value Date   HGBA1C 6.1 (H) 12/30/2019   HGBA1C 6.1 (H) 09/28/2019   HGBA1C 6.2 (H) 06/29/2019   Lab Results  Component Value Date   MICROALBUR 30 12/30/2019   LDLCALC 107 (H) 12/30/2019   CREATININE 0.75 12/30/2019   BP Readings from Last 3 Encounters:  12/30/19 126/78  12/30/19 126/78  12/01/19 130/86    Plan:   No further follow up required   Barb Merino, RN, BSN, [place] Management Coordinator [place] [facility]  Direct Phone: [phone]

## 2020-01-13 ENCOUNTER — Encounter: Payer: Medicare Other | Admitting: Internal Medicine

## 2020-01-13 ENCOUNTER — Ambulatory Visit: Payer: Medicare Other

## 2020-02-07 ENCOUNTER — Other Ambulatory Visit: Payer: Self-pay | Admitting: Internal Medicine

## 2020-02-10 ENCOUNTER — Other Ambulatory Visit: Payer: Self-pay | Admitting: Internal Medicine

## 2020-03-22 ENCOUNTER — Encounter: Payer: Self-pay | Admitting: Internal Medicine

## 2020-03-22 DIAGNOSIS — M549 Dorsalgia, unspecified: Secondary | ICD-10-CM | POA: Diagnosis not present

## 2020-03-22 DIAGNOSIS — R319 Hematuria, unspecified: Secondary | ICD-10-CM | POA: Diagnosis not present

## 2020-03-23 ENCOUNTER — Telehealth: Payer: Self-pay

## 2020-03-23 NOTE — Telephone Encounter (Signed)
The pt was notified that her Ozempic has been delivered from the [employer] patient assistance program and is ready for pickup.

## 2020-03-25 DIAGNOSIS — R319 Hematuria, unspecified: Secondary | ICD-10-CM | POA: Diagnosis not present

## 2020-03-25 DIAGNOSIS — R3129 Other microscopic hematuria: Secondary | ICD-10-CM | POA: Diagnosis not present

## 2020-03-25 DIAGNOSIS — M549 Dorsalgia, unspecified: Secondary | ICD-10-CM | POA: Diagnosis not present

## 2020-05-05 ENCOUNTER — Other Ambulatory Visit: Payer: Self-pay

## 2020-05-05 ENCOUNTER — Encounter: Payer: Self-pay | Admitting: Internal Medicine

## 2020-05-05 ENCOUNTER — Ambulatory Visit: Payer: Medicare PPO | Admitting: Internal Medicine

## 2020-05-05 VITALS — BP 114/76 | HR 59 | Temp 98.2°F | Ht 66.0 in | Wt 248.6 lb

## 2020-05-05 DIAGNOSIS — M25562 Pain in left knee: Secondary | ICD-10-CM

## 2020-05-05 DIAGNOSIS — I10 Essential (primary) hypertension: Secondary | ICD-10-CM | POA: Diagnosis not present

## 2020-05-05 DIAGNOSIS — E1165 Type 2 diabetes mellitus with hyperglycemia: Secondary | ICD-10-CM

## 2020-05-05 DIAGNOSIS — Z23 Encounter for immunization: Secondary | ICD-10-CM | POA: Diagnosis not present

## 2020-05-05 DIAGNOSIS — Z6841 Body Mass Index (BMI) 40.0 and over, adult: Secondary | ICD-10-CM

## 2020-05-05 NOTE — Progress Notes (Signed)
I,Katawbba Wiggins,acting as a Education administrator for [name], MD.,have documented all relevant documentation on the behalf of [name], MD,as directed by  [name], MD while in the presence of [name], MD.  This visit occurred during the SARS-CoV-2 public health emergency.  Safety protocols were in place, including screening questions prior to the visit, additional usage of staff PPE, and extensive cleaning of exam room while observing appropriate contact time as indicated for disinfecting solutions.  Subjective:     Patient ID: [name] , female    DOB: [birth year] , 69 y.o.   MRN: [id number]   Chief Complaint  Patient presents with  . Diabetes  . Hypertension    HPI  The patient is here today for a diabetes and blood pressure follow-up.  She reports compliance with meds. She recently spent time in [place] with family.   Diabetes She presents for her follow-up diabetic visit. She has type 2 diabetes mellitus. Her disease course has been improving. There are no hypoglycemic associated symptoms. There are no diabetic associated symptoms. Pertinent negatives for diabetes include no blurred vision and no chest pain. There are no hypoglycemic complications. There are no diabetic complications. Risk factors for coronary artery disease include diabetes mellitus, hypertension, obesity and post-menopausal. She is compliant with treatment most of the time. She is following a generally healthy diet. She participates in exercise intermittently. Her breakfast blood glucose is taken between 8-9 am. Her breakfast blood glucose range is generally 70-90 mg/dl. Eye exam is current.  Hypertension This is a chronic problem. The current episode started more than 1 year ago. The problem is unchanged. The problem is controlled. Pertinent negatives include no blurred vision, chest pain, neck pain, orthopnea, palpitations, PND or shortness of breath. The current treatment provides moderate  improvement.     Past Medical History:  Diagnosis Date  . Diabetes mellitus without complication ([place])   . GERD (gastroesophageal reflux disease)   . Headache(784.0)   . High cholesterol   . Hypertension   . Sleep apnea    mild- no CPAP  . Stroke [facility]) 2005   mini- stroke due to HTN     Family History  Problem Relation Age of Onset  . Cancer Mother   . Hypertension Mother   . Cancer Maternal Grandmother   . Hypertension Maternal Grandmother      Current Outpatient Medications:  .  Alcohol Swabs (B-D SINGLE USE SWABS REGULAR) PADS, Use as directed to check blood sugars 1 time per dx: e11.22, Disp: 200 each, Rfl: 3 .  Blood Glucose Calibration (TRUE METRIX LEVEL 1) Low SOLN, Use as directed to check blood sugars 1 time per dx: e11.22, Disp: 2 each, Rfl: 2 .  Blood Glucose Monitoring Suppl (TRUE METRIX AIR GLUCOSE METER) w/Device KIT, Inject 1 kit into the skin daily. Use as directed to check blood sugars 1 time per dx: e11.22, Disp: 1 kit, Rfl: 1 .  Cholecalciferol (VITAMIN D) 2000 UNITS tablet, Take 2,000 Units by mouth daily. , Disp: , Rfl:  .  diclofenac Sodium (VOLTAREN) 1 % GEL, Place onto the skin., Disp: , Rfl:  .  glucose blood (TRUE METRIX BLOOD GLUCOSE TEST) test strip, USE AS DIRECTED TO CHECK BLOOD SUGARS, Disp: 300 strip, Rfl: 3 .  latanoprost (XALATAN) 0.005 % ophthalmic solution, INSTILL 1 DROP INTO OU HS, Disp: , Rfl:  .  levocetirizine (XYZAL) 5 MG tablet, Take 5 mg by mouth daily as needed for allergies., Disp: ,  Rfl:  .  metFORMIN (GLUCOPHAGE-XR) 750 MG 24 hr tablet, TAKE 1 TABLET BY MOUTH  DAILY WITH EVENING MEAL, Disp: 90 tablet, Rfl: 3 .  Multiple Vitamin (MULTIVITAMIN WITH MINERALS) TABS tablet, Take 1 tablet by mouth daily., Disp: , Rfl:  .  Olmesartan-amLODIPine-HCTZ 40-5-12.5 MG TABS, Take 1 tablet by mouth daily., Disp: 90 tablet, Rfl: 3 .  pravastatin (PRAVACHOL) 40 MG tablet, Take 1 tablet (40 mg total) by mouth every evening., Disp: 45 tablet, Rfl:  3 .  Semaglutide,0.25 or 0.5MG/DOS, (OZEMPIC, 0.25 OR 0.5 MG/DOSE,) 2 MG/1.5ML SOPN, Inject 0.5 mg into the skin once a week., Disp: 3 pen, Rfl: 3 .  Triamcinolone Acetonide (NASACORT AQ NA), Place into the nose., Disp: , Rfl:  .  TRUEplus Lancets 33G MISC, USE AS DIRECTED TO CHECK BLOOD SUGARS, Disp: 300 each, Rfl: 3   Allergies  Allergen Reactions  . Codeine Nausea And Vomiting and Diarrhea  . Meperidine     Other reaction(s): Delusions (intolerance)     Review of Systems  Constitutional: Negative.   Eyes: Negative for blurred vision.  Respiratory: Negative.  Negative for shortness of breath.   Cardiovascular: Negative.  Negative for chest pain, palpitations, orthopnea and PND.  Gastrointestinal: Negative.   Musculoskeletal: Negative for neck pain.  Neurological: Negative.   Psychiatric/Behavioral: Negative.   All other systems reviewed and are negative.    Today's Vitals   05/05/20 1456  BP: 114/76  Pulse: (!) 59  Temp: 98.2 F (36.8 C)  TempSrc: Oral  Weight: 248 lb 9.6 oz (112.8 kg)  Height: 5' 6"  (1.676 m)  PainSc: 0-No pain   Body mass index is 40.13 kg/m.  Wt Readings from Last 3 Encounters:  05/05/20 248 lb 9.6 oz (112.8 kg)  12/30/19 250 lb (113.4 kg)  12/30/19 250 lb 9.6 oz (113.7 kg)   Objective:  Physical Exam Vitals and nursing note reviewed.  Constitutional:      Appearance: Normal appearance. She is obese.  HENT:     Head: Normocephalic and atraumatic.  Cardiovascular:     Rate and Rhythm: Normal rate and regular rhythm.     Heart sounds: Normal heart sounds.  Pulmonary:     Breath sounds: Normal breath sounds.  Skin:    General: Skin is warm.  Neurological:     General: No focal deficit present.     Mental Status: She is alert and oriented to person, place, and time.         Assessment And Plan:     1. Type 2 diabetes mellitus with hyperglycemia, without long-term current use of insulin (HCC) Comments: Chronic, I will check labs as  listed below. I will adjust meds as needed.  - BMP8+EGFR - Hemoglobin A1c  2. Essential hypertension, benign Comments: Chronic, well controlled. She will continue with current meds.    3. Class 3 severe obesity due to excess calories with serious comorbidity and body mass index (BMI) of 40.0 to 44.9 in adult [facility]) Comments: BMI 40. She is encouraged to strive for BMI less than 35 to decrease cardiac risk. ADvised to aim for at least 150 minutes per week.   4. Need for vaccination Comments: She was given high dose flu vaccine . - Flu Vaccine QUAD High Dose(Fluad)    Patient was given opportunity to ask questions. Patient verbalized understanding of the plan and was able to repeat key elements of the plan. All questions were answered to their satisfaction.  [name], MD  I, [name], MD, have reviewed all documentation for this visit. The documentation on 05/05/20 for the exam, diagnosis, procedures, and orders are all accurate and complete.  THE PATIENT IS ENCOURAGED TO PRACTICE SOCIAL DISTANCING DUE TO THE COVID-19 PANDEMIC.

## 2020-05-05 NOTE — Patient Instructions (Signed)
Diabetes Mellitus and Foot Care Foot care is an important part of your health, especially when you have diabetes. Diabetes may cause you to have problems because of poor blood flow (circulation) to your feet and legs, which can cause your skin to:  Become thinner and drier.  Break more easily.  Heal more slowly.  Peel and crack. You may also have nerve damage (neuropathy) in your legs and feet, causing decreased feeling in them. This means that you may not notice minor injuries to your feet that could lead to more serious problems. Noticing and addressing any potential problems early is the best way to prevent future foot problems. How to care for your feet Foot hygiene  Wash your feet daily with warm water and mild soap. Do not use hot water. Then, pat your feet and the areas between your toes until they are completely dry. Do not soak your feet as this can dry your skin.  Trim your toenails straight across. Do not dig under them or around the cuticle. File the edges of your nails with an emery board or nail file.  Apply a moisturizing lotion or petroleum jelly to the skin on your feet and to dry, brittle toenails. Use lotion that does not contain alcohol and is unscented. Do not apply lotion between your toes. Shoes and socks  Wear clean socks or stockings every day. Make sure they are not too tight. Do not wear knee-high stockings since they may decrease blood flow to your legs.  Wear shoes that fit properly and have enough cushioning. Always look in your shoes before you put them on to be sure there are no objects inside.  To break in new shoes, wear them for just a few hours a day. This prevents injuries on your feet. Wounds, scrapes, corns, and calluses  Check your feet daily for blisters, cuts, bruises, sores, and redness. If you cannot see the bottom of your feet, use a mirror or ask someone for help.  Do not cut corns or calluses or try to remove them with medicine.  If you  find a minor scrape, cut, or break in the skin on your feet, keep it and the skin around it clean and dry. You may clean these areas with mild soap and water. Do not clean the area with peroxide, alcohol, or iodine.  If you have a wound, scrape, corn, or callus on your foot, look at it several times a day to make sure it is healing and not infected. Check for: ? Redness, swelling, or pain. ? Fluid or blood. ? Warmth. ? Pus or a bad smell. General instructions  Do not cross your legs. This may decrease blood flow to your feet.  Do not use heating pads or hot water bottles on your feet. They may burn your skin. If you have lost feeling in your feet or legs, you may not know this is happening until it is too late.  Protect your feet from hot and cold by wearing shoes, such as at the beach or on hot pavement.  Schedule a complete foot exam at least once a year (annually) or more often if you have foot problems. If you have foot problems, report any cuts, sores, or bruises to your health care provider immediately. Contact a health care provider if:  You have a medical condition that increases your risk of infection and you have any cuts, sores, or bruises on your feet.  You have an injury that is not   healing.  You have redness on your legs or feet.  You feel burning or tingling in your legs or feet.  You have pain or cramps in your legs and feet.  Your legs or feet are numb.  Your feet always feel cold.  You have pain around a toenail. Get help right away if:  You have a wound, scrape, corn, or callus on your foot and: ? You have pain, swelling, or redness that gets worse. ? You have fluid or blood coming from the wound, scrape, corn, or callus. ? Your wound, scrape, corn, or callus feels warm to the touch. ? You have pus or a bad smell coming from the wound, scrape, corn, or callus. ? You have a fever. ? You have a red line going up your leg. Summary  Check your feet every day  for cuts, sores, red spots, swelling, and blisters.  Moisturize feet and legs daily.  Wear shoes that fit properly and have enough cushioning.  If you have foot problems, report any cuts, sores, or bruises to your health care provider immediately.  Schedule a complete foot exam at least once a year (annually) or more often if you have foot problems. This information is not intended to replace advice given to you by your health care provider. Make sure you discuss any questions you have with your health care provider. Document Revised: 03/25/2019 Document Reviewed: 08/03/2016 Elsevier Patient Education  2020 Elsevier Inc.  

## 2020-05-06 LAB — BMP8+EGFR
BUN/Creatinine Ratio: 19 (ref 12–28)
BUN: 15 mg/dL (ref 8–27)
CO2: 24 mmol/L (ref 20–29)
Calcium: 9.6 mg/dL (ref 8.7–10.3)
Chloride: 104 mmol/L (ref 96–106)
Creatinine, Ser: 0.78 mg/dL (ref 0.57–1.00)
GFR calc Af Amer: 90 mL/min/{1.73_m2} (ref >59)
GFR calc non Af Amer: 78 mL/min/{1.73_m2} (ref >59)
Glucose: 99 mg/dL (ref 65–99)
Potassium: 3.8 mmol/L (ref 3.5–5.2)
Sodium: 139 mmol/L (ref 134–144)

## 2020-05-06 LAB — HEMOGLOBIN A1C
Est. average glucose Bld gHb Est-mCnc: 128 mg/dL
Hgb A1c MFr Bld: 6.1 % — ABNORMAL HIGH (ref 4.8–5.6)

## 2020-05-12 ENCOUNTER — Encounter: Payer: Self-pay | Admitting: Internal Medicine

## 2020-05-27 ENCOUNTER — Other Ambulatory Visit: Payer: Self-pay | Admitting: Internal Medicine

## 2020-05-27 DIAGNOSIS — Z1231 Encounter for screening mammogram for malignant neoplasm of breast: Secondary | ICD-10-CM

## 2020-06-22 DIAGNOSIS — H40023 Open angle with borderline findings, high risk, bilateral: Secondary | ICD-10-CM | POA: Diagnosis not present

## 2020-06-22 DIAGNOSIS — H2513 Age-related nuclear cataract, bilateral: Secondary | ICD-10-CM | POA: Diagnosis not present

## 2020-07-05 ENCOUNTER — Telehealth: Payer: Self-pay

## 2020-07-05 NOTE — Chronic Care Management (AMB) (Signed)
    Chronic Care Management Pharmacy Assistant   Name: [name]  MRN: [id number] DOB: 01-15-1951  Reason for Encounter: Patient Assistance Coordination  PCP : Glendale Chard, MD   07/05/2020- Patient assistance application for [phone] filled out for patient's Ozempic with [employer], awaiting provider signature, patient signature and income documentation.   Allergies:   Allergies  Allergen Reactions  . Codeine Nausea And Vomiting and Diarrhea  . Meperidine     Other reaction(s): Delusions (intolerance)    Medications: Outpatient Encounter Medications as of 07/05/2020  Medication Sig  . Alcohol Swabs (B-D SINGLE USE SWABS REGULAR) PADS Use as directed to check blood sugars 1 time per dx: e11.22  . Blood Glucose Calibration (TRUE METRIX LEVEL 1) Low SOLN Use as directed to check blood sugars 1 time per dx: e11.22  . Blood Glucose Monitoring Suppl (TRUE METRIX AIR GLUCOSE METER) w/Device KIT Inject 1 kit into the skin daily. Use as directed to check blood sugars 1 time per dx: e11.22  . Cholecalciferol (VITAMIN D) 2000 UNITS tablet Take 2,000 Units by mouth daily.   . diclofenac Sodium (VOLTAREN) 1 % GEL Place onto the skin.  Marland Kitchen glucose blood (TRUE METRIX BLOOD GLUCOSE TEST) test strip USE AS DIRECTED TO CHECK BLOOD SUGARS  . latanoprost (XALATAN) 0.005 % ophthalmic solution INSTILL 1 DROP INTO OU HS  . levocetirizine (XYZAL) 5 MG tablet Take 5 mg by mouth daily as needed for allergies.  . metFORMIN (GLUCOPHAGE-XR) 750 MG 24 hr tablet TAKE 1 TABLET BY MOUTH  DAILY WITH EVENING MEAL  . Multiple Vitamin (MULTIVITAMIN WITH MINERALS) TABS tablet Take 1 tablet by mouth daily.  . Olmesartan-amLODIPine-HCTZ 40-5-12.5 MG TABS Take 1 tablet by mouth daily.  . pravastatin (PRAVACHOL) 40 MG tablet Take 1 tablet (40 mg total) by mouth every evening.  . Semaglutide,0.25 or 0.5MG /DOS, (OZEMPIC, 0.25 OR 0.5 MG/DOSE,) 2 MG/1.5ML SOPN Inject 0.5 mg into the skin once a week.  . Triamcinolone  Acetonide (NASACORT AQ NA) Place into the nose.  . TRUEplus Lancets 33G MISC USE AS DIRECTED TO CHECK BLOOD SUGARS   No facility-administered encounter medications on file as of 07/05/2020.    Current Diagnosis: Patient Active Problem List   Diagnosis Date Noted  . OSA on CPAP 07/30/2018  . Left hip pain 07/02/2018  . Snoring 02/27/2018  . Intermittent sleepiness 02/27/2018  . Sleep pattern disturbance 02/27/2018  . Obesity (BMI 35.0-39.9 without comorbidity) 02/27/2018  . Essential hypertension 02/27/2018     Follow-Up:  Patient Assistance Coordination  07/11/2020- Left message on home number to return call, checking to see when patient will be able to come into office to sign paperwork and bring income documentation.  07/18/2020- Patient returned call, she is aware that paperwork needs to be signed for patient assistance medication Ozempic and will come by the office 07/21/2020 to sign and will bring income documentation.  [name], [place] Pharmacist Assistant [phone]

## 2020-07-06 ENCOUNTER — Other Ambulatory Visit: Payer: Self-pay | Admitting: Internal Medicine

## 2020-07-06 ENCOUNTER — Ambulatory Visit: Payer: Medicare Other

## 2020-07-06 IMAGING — MG DIGITAL SCREENING BILATERAL MAMMOGRAM WITH CAD
5 series · 5 of 5 positions shown · non-contrast
Comparison: Previous exam(s).

ACR Breast Density Category a: The breast tissue is almost entirely
fatty.

CLINICAL DATA: Screening.

EXAM:
DIGITAL SCREENING BILATERAL MAMMOGRAM WITH CAD

[R MLO]
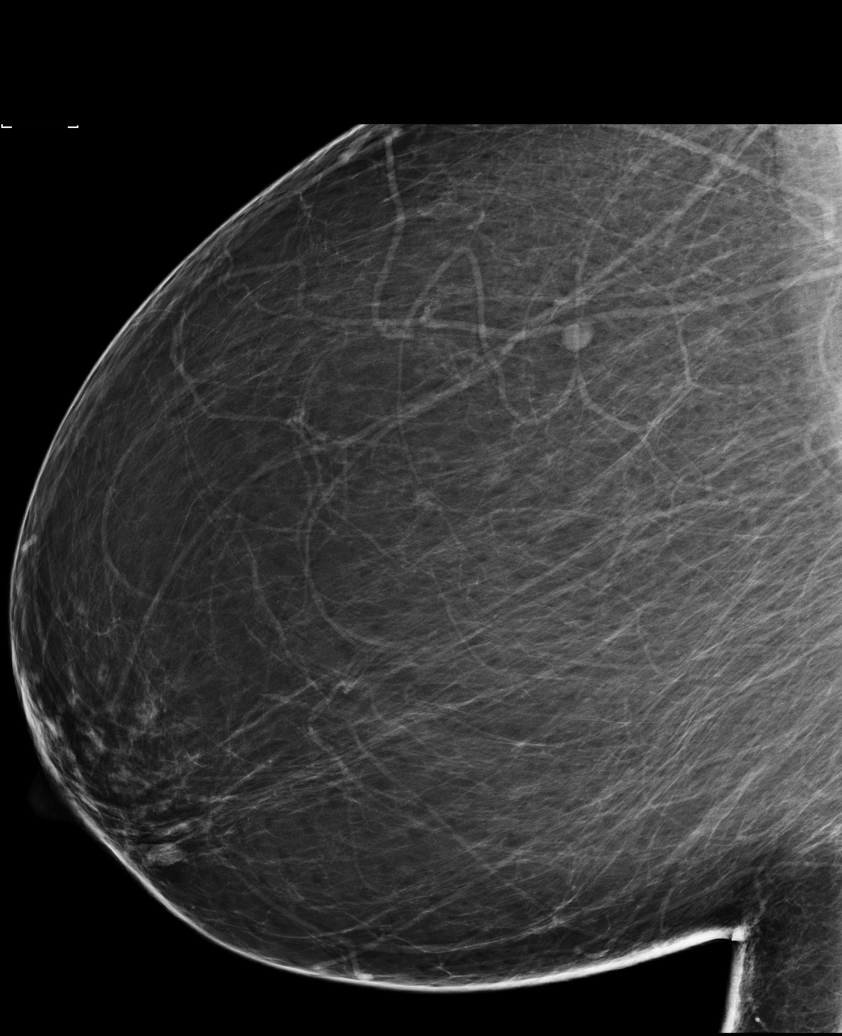

[L CV]
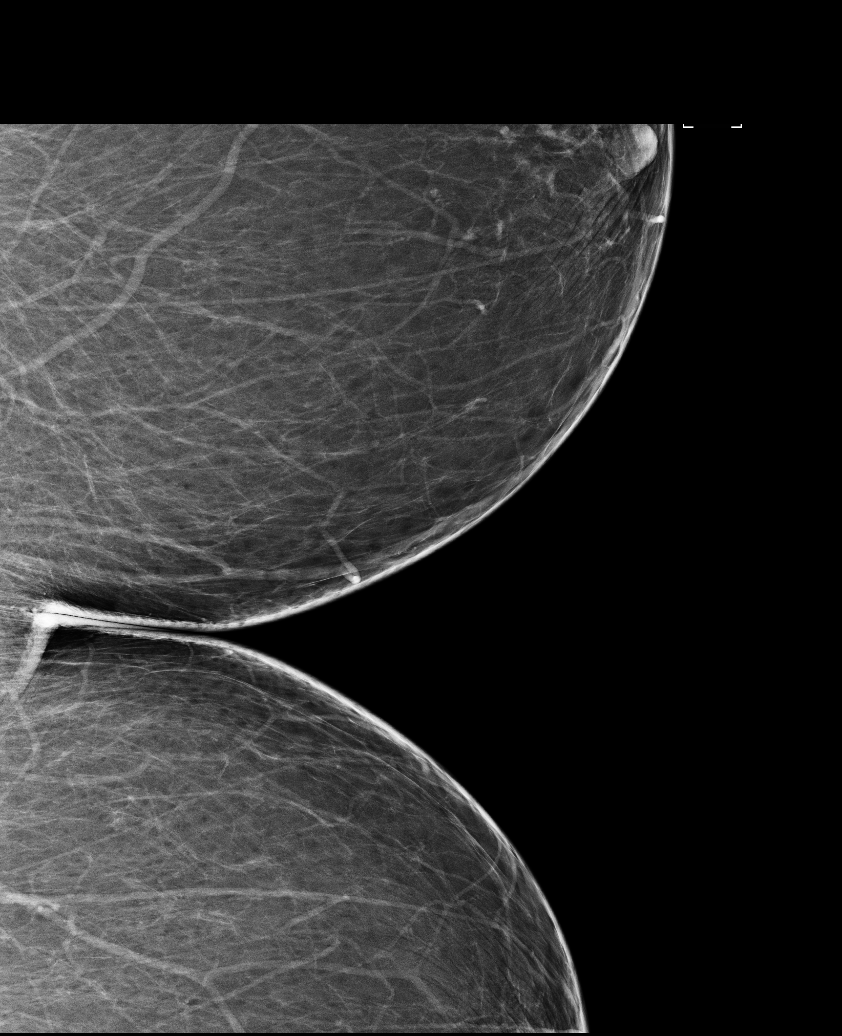

[R CC]
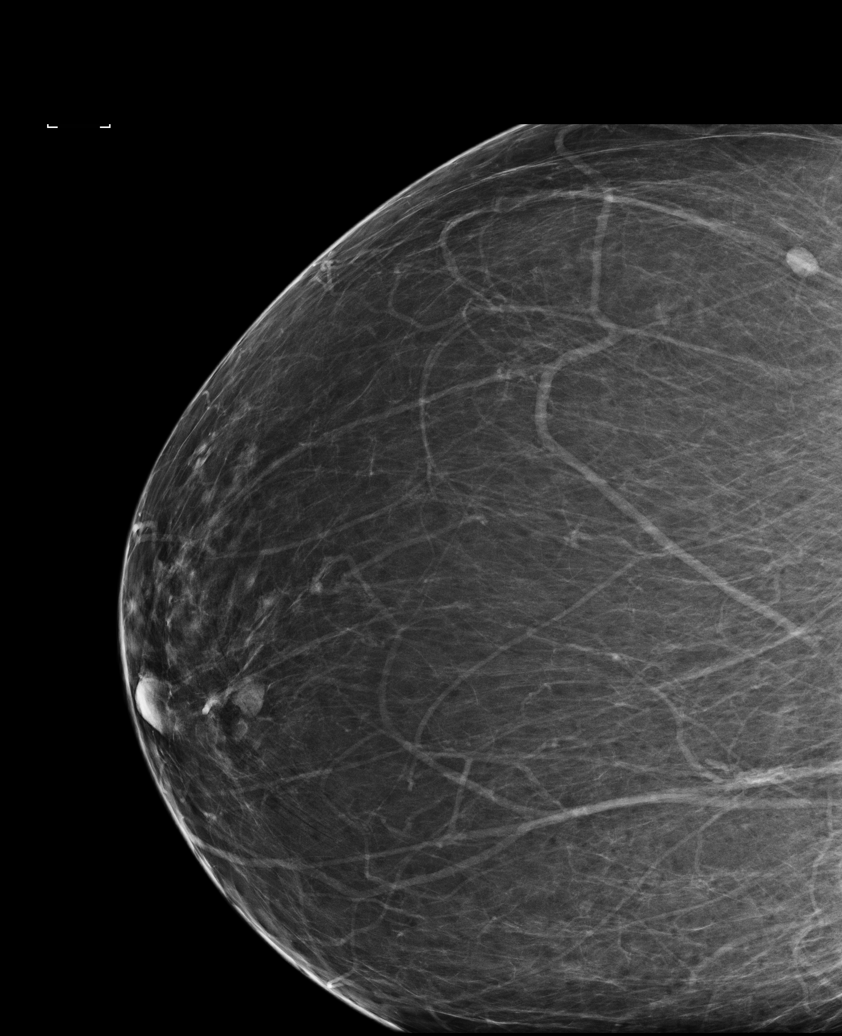

[L MLO]
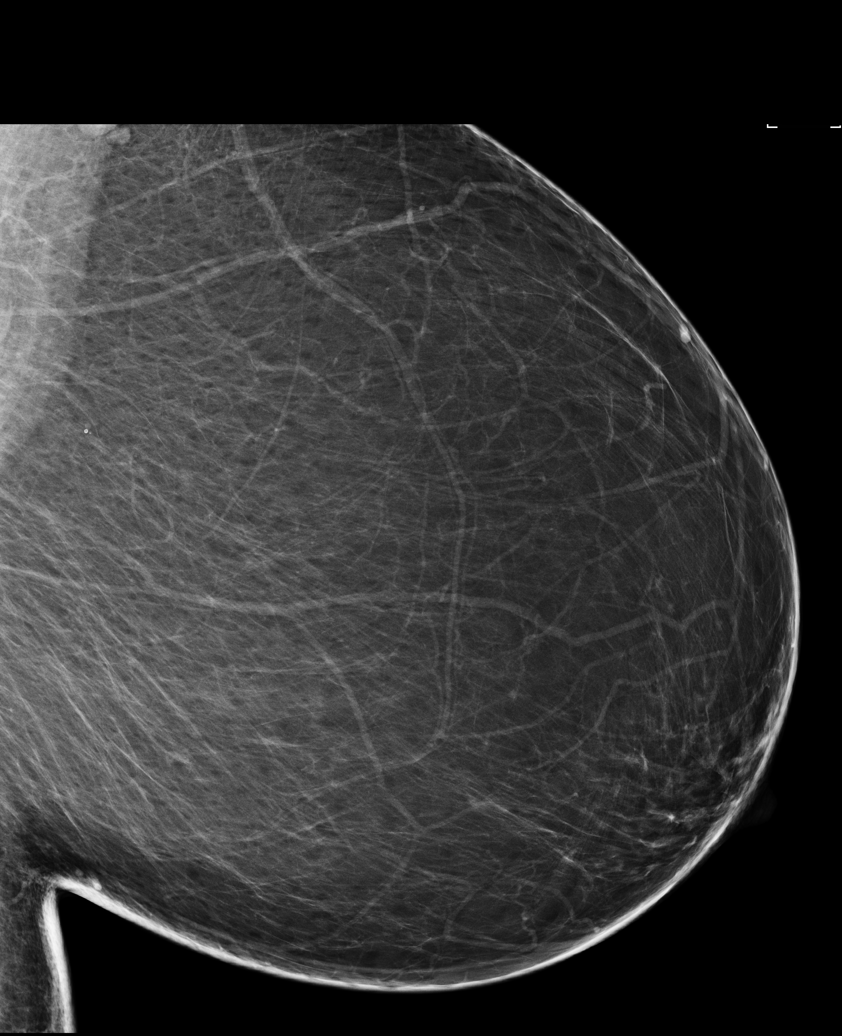

[L CC]
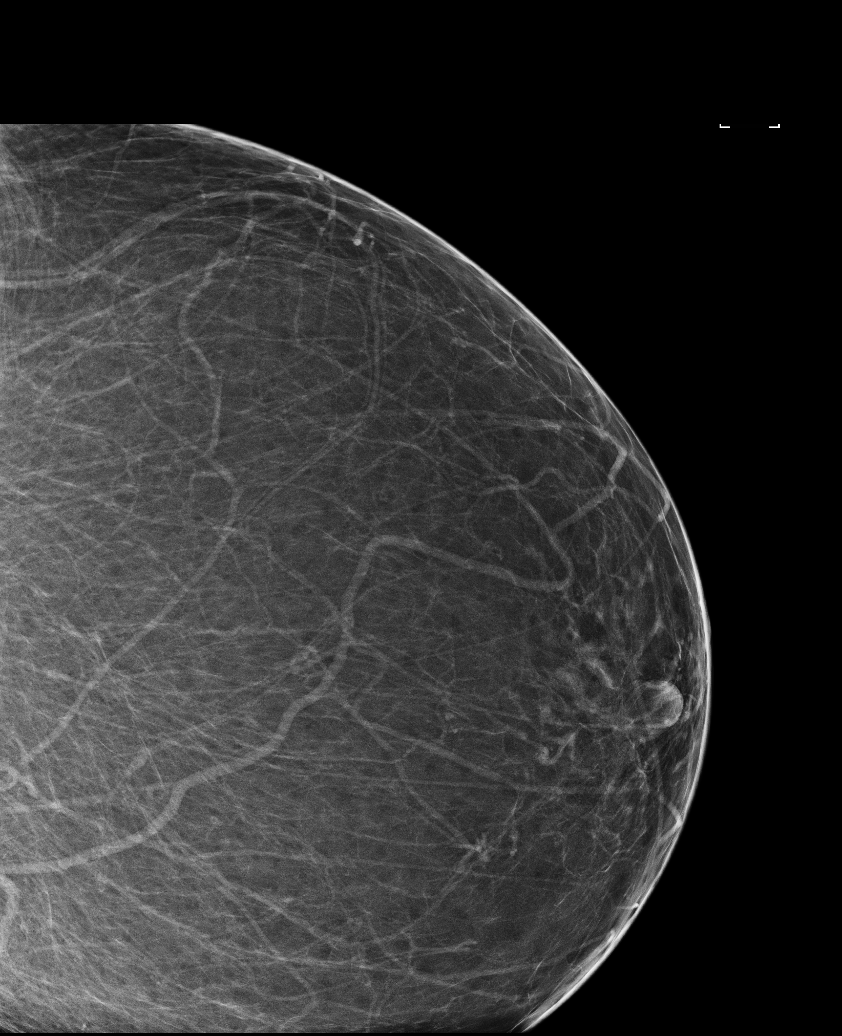

[5 of 5 positions shown; findings below may reference images not displayed]

FINDINGS: There are no findings suspicious for malignancy. Images were
processed with CAD.
IMPRESSION: No mammographic evidence of malignancy. A result letter of this
screening mammogram will be mailed directly to the patient.

RECOMMENDATION:
Screening mammogram in one year. (Code:[id number])

BI-RADS CATEGORY  1: Negative.

## 2020-07-21 DIAGNOSIS — Z20822 Contact with and (suspected) exposure to covid-19: Secondary | ICD-10-CM | POA: Diagnosis not present

## 2020-07-26 ENCOUNTER — Encounter: Payer: Self-pay | Admitting: Internal Medicine

## 2020-07-28 ENCOUNTER — Ambulatory Visit: Payer: Medicare PPO | Admitting: Nurse Practitioner

## 2020-07-28 ENCOUNTER — Encounter: Payer: Self-pay | Admitting: Nurse Practitioner

## 2020-07-28 ENCOUNTER — Other Ambulatory Visit: Payer: Self-pay

## 2020-07-28 VITALS — BP 138/80 | HR 81 | Temp 98.8°F | Ht 65.2 in | Wt 249.8 lb

## 2020-07-28 DIAGNOSIS — R350 Frequency of micturition: Secondary | ICD-10-CM

## 2020-07-28 DIAGNOSIS — Z6841 Body Mass Index (BMI) 40.0 and over, adult: Secondary | ICD-10-CM

## 2020-07-28 DIAGNOSIS — R319 Hematuria, unspecified: Secondary | ICD-10-CM | POA: Diagnosis not present

## 2020-07-28 LAB — POCT URINALYSIS DIPSTICK
Bilirubin, UA: NEGATIVE
Glucose, UA: NEGATIVE
Ketones, UA: NEGATIVE
Leukocytes, UA: NEGATIVE
Nitrite, UA: NEGATIVE
Protein, UA: NEGATIVE
Spec Grav, UA: 1.02 (ref 1.010–1.025)
Urobilinogen, UA: 0.2 E.U./dL
pH, UA: 5 (ref 5.0–8.0)

## 2020-07-28 NOTE — Progress Notes (Signed)
I,Tianna Badgett,acting as a Education administrator for [employer], NP.,have documented all relevant documentation on the behalf of [employer], NP,as directed by  Bary Castilla, NP while in the presence of Bary Castilla, NP.  This visit occurred during the SARS-CoV-2 public health emergency.  Safety protocols were in place, including screening questions prior to the visit, additional usage of staff PPE, and extensive cleaning of exam room while observing appropriate contact time as indicated for disinfecting solutions.  Subjective:     Patient ID: [name] , female    DOB: 1951-05-21 , 70 y.o.   MRN: [id number]   Chief Complaint  Patient presents with  . Urinary Frequency    HPI  Patient is here today for frequency in urination. She stated that this has been going on for about 2 weeks.  When she urinates it is bubbly or foamy.  She has had a small amount of blood in her urine dipstick today in office. She has not seen any blood in her urine at home. She wakes once daily at night to urinate. Patient goes every 3 hours to urinate.   Patient will come in 2 weeks for a repeat of urine. If she still has blood we will get a renal ultrasound.     Past Medical History:  Diagnosis Date  . Diabetes mellitus without complication ([place])   . GERD (gastroesophageal reflux disease)   . Headache(784.0)   . High cholesterol   . Hypertension   . Sleep apnea    mild- no CPAP  . Stroke [facility]) 2005   mini- stroke due to HTN     Family History  Problem Relation Age of Onset  . Cancer Mother   . Hypertension Mother   . Cancer Maternal Grandmother   . Hypertension Maternal Grandmother      Current Outpatient Medications:  .  Alcohol Swabs (B-D SINGLE USE SWABS REGULAR) PADS, Use as directed to check blood sugars 1 time per dx: e11.22, Disp: 200 each, Rfl: 3 .  Blood Glucose Calibration (TRUE METRIX LEVEL 1) Low SOLN, Use as directed to check blood sugars 1 time per dx: e11.22, Disp: 2  each, Rfl: 2 .  Blood Glucose Monitoring Suppl (TRUE METRIX AIR GLUCOSE METER) w/Device KIT, Inject 1 kit into the skin daily. Use as directed to check blood sugars 1 time per dx: e11.22, Disp: 1 kit, Rfl: 1 .  Cholecalciferol (VITAMIN D) 2000 UNITS tablet, Take 2,000 Units by mouth daily. , Disp: , Rfl:  .  diclofenac Sodium (VOLTAREN) 1 % GEL, Place onto the skin., Disp: , Rfl:  .  glucose blood (TRUE METRIX BLOOD GLUCOSE TEST) test strip, USE AS DIRECTED TO CHECK BLOOD SUGARS, Disp: 300 strip, Rfl: 3 .  latanoprost (XALATAN) 0.005 % ophthalmic solution, INSTILL 1 DROP INTO OU HS, Disp: , Rfl:  .  levocetirizine (XYZAL) 5 MG tablet, Take 5 mg by mouth daily as needed for allergies., Disp: , Rfl:  .  metFORMIN (GLUCOPHAGE-XR) 750 MG 24 hr tablet, TAKE 1 TABLET BY MOUTH  DAILY WITH EVENING MEAL, Disp: 90 tablet, Rfl: 3 .  Multiple Vitamin (MULTIVITAMIN WITH MINERALS) TABS tablet, Take 1 tablet by mouth daily., Disp: , Rfl:  .  Olmesartan-amLODIPine-HCTZ 40-5-12.5 MG TABS, TAKE 1 TABLET BY MOUTH  DAILY, Disp: 90 tablet, Rfl: 3 .  pravastatin (PRAVACHOL) 40 MG tablet, Take 1 tablet (40 mg total) by mouth every evening., Disp: 45 tablet, Rfl: 3 .  Semaglutide,0.25 or 0.5MG/DOS, (OZEMPIC, 0.25 OR 0.5 MG/DOSE,)  2 MG/1.5ML SOPN, Inject 0.5 mg into the skin once a week., Disp: 3 pen, Rfl: 3 .  Triamcinolone Acetonide (NASACORT AQ NA), Place into the nose., Disp: , Rfl:  .  TRUEplus Lancets 33G MISC, USE AS DIRECTED TO CHECK BLOOD SUGARS, Disp: 300 each, Rfl: 3   Allergies  Allergen Reactions  . Codeine Nausea And Vomiting and Diarrhea  . Meperidine     Other reaction(s): Delusions (intolerance)     Review of Systems  Constitutional: Negative for chills, fatigue and fever.  Genitourinary: Positive for frequency. Negative for difficulty urinating, dysuria, flank pain, pelvic pain and urgency.  Psychiatric/Behavioral: Negative for confusion.     Today's Vitals   07/28/20 1416  BP: 138/80  Pulse:  81  Temp: 98.8 F (37.1 C)  TempSrc: Oral  Weight: 249 lb 12.8 oz (113.3 kg)  Height: 5' 5.2" (1.656 m)   Body mass index is 41.31 kg/m.  Wt Readings from Last 3 Encounters:  07/28/20 249 lb 12.8 oz (113.3 kg)  05/05/20 248 lb 9.6 oz (112.8 kg)  12/30/19 250 lb (113.4 kg)    Objective:  Physical Exam Constitutional:      Appearance: Normal appearance.  HENT:     Head: Normocephalic and atraumatic.  Cardiovascular:     Rate and Rhythm: Normal rate and regular rhythm.     Pulses: Normal pulses.     Heart sounds: Normal heart sounds.  Pulmonary:     Effort: Pulmonary effort is normal. No respiratory distress.     Breath sounds: Normal breath sounds. No wheezing.  Neurological:     Mental Status: She is alert.         Assessment And Plan:     1. Urinary frequency - Culture, Urine - POCT Urinalysis Dipstick (81002) -Urine dipstick neg for leuk, glucose and nit.   2. Hematuria, unspecified type - Culture, Urine -Will repeat in 2 weeks  -If still hematuria then will send patient for a renal ultrasound  referral to urologist depending on renal ultrasound.   3. Class 3 severe obesity due to excess calories with serious comorbidity and body mass index (BMI) of 40.0 to 44.9 in adult [facility])    Staying healthy and adopting a healthy lifestyle for your overall health is important. You should eat 7 or more servings of fruits and vegetables per day. You should drink plenty of water to keep yourself hydrated and your kidneys healthy. This includes about 65-80+ fluid ounces of water. Limit your intake of animal fats especially for elevated cholesterol. Avoid highly processed food and limit your salt intake if you have hypertension. Avoid foods high in saturated/Trans fats. Along with a healthy diet it is also very important to maintain time for yourself to maintain a healthy mental health with low stress levels. You should get atleast 150 min of moderate intensity exercise weekly for a  healthy heart. Along with eating right and exercising, aim for at least 7-9 hours of sleep daily.  Eat more whole grains which includes barley, wheat berries, oats, brown rice and whole wheat pasta. Use healthy plant oils which include olive, soy, corn, sunflower and peanut. Limit your caffeine and sugary drinks. Limit your intake of fast foods. Limit milk and dairy products to one or two daily servings.    Patient will follow up in 2 weeks for a repeat urine dipstick.   Patient was given opportunity to ask questions. Patient verbalized understanding of the plan and was able to repeat key elements of  the plan. All questions were answered to their satisfaction.  Bary Castilla, NP   I, Bary Castilla, NP, have reviewed all documentation for this visit. The documentation on 07/28/20 for the exam, diagnosis, procedures, and orders are all accurate and complete.  THE PATIENT IS ENCOURAGED TO PRACTICE SOCIAL DISTANCING DUE TO THE COVID-19 PANDEMIC.

## 2020-08-09 ENCOUNTER — Other Ambulatory Visit: Payer: Self-pay | Admitting: Internal Medicine

## 2020-08-09 ENCOUNTER — Ambulatory Visit
Admission: RE | Admit: 2020-08-09 | Discharge: 2020-08-09 | Disposition: A | Discharge status: Home / Self Care | Payer: Medicare Other | Source: Ambulatory Visit | Attending: Internal Medicine | Admitting: Internal Medicine

## 2020-08-09 ENCOUNTER — Ambulatory Visit (INDEPENDENT_AMBULATORY_CARE_PROVIDER_SITE_OTHER): Payer: Medicare PPO

## 2020-08-09 ENCOUNTER — Other Ambulatory Visit: Payer: Self-pay

## 2020-08-09 VITALS — BP 127/78 | HR 77 | Temp 98.1°F

## 2020-08-09 DIAGNOSIS — R3121 Asymptomatic microscopic hematuria: Secondary | ICD-10-CM

## 2020-08-09 DIAGNOSIS — Z1231 Encounter for screening mammogram for malignant neoplasm of breast: Secondary | ICD-10-CM

## 2020-08-09 LAB — POCT URINALYSIS DIPSTICK
Bilirubin, UA: NEGATIVE
Glucose, UA: NEGATIVE
Ketones, UA: NEGATIVE
Leukocytes, UA: NEGATIVE
Nitrite, UA: NEGATIVE
Protein, UA: NEGATIVE
Spec Grav, UA: 1.025 (ref 1.010–1.025)
Urobilinogen, UA: 0.2 E.U./dL
pH, UA: 5 (ref 5.0–8.0)

## 2020-08-09 NOTE — Progress Notes (Signed)
Patient is here today for repeat urine due to blood being present in her urine at her last visit. At this time she has moderate blood in her urine. Provider will further evaluate patient.

## 2020-08-10 ENCOUNTER — Ambulatory Visit
Admission: RE | Admit: 2020-08-10 | Discharge: 2020-08-10 | Disposition: A | Discharge status: Home / Self Care | Payer: Medicare PPO | Source: Ambulatory Visit | Attending: Internal Medicine | Admitting: Internal Medicine

## 2020-08-10 DIAGNOSIS — R3129 Other microscopic hematuria: Secondary | ICD-10-CM | POA: Diagnosis not present

## 2020-08-10 DIAGNOSIS — R3121 Asymptomatic microscopic hematuria: Secondary | ICD-10-CM

## 2020-09-08 ENCOUNTER — Other Ambulatory Visit: Payer: Self-pay

## 2020-09-08 ENCOUNTER — Ambulatory Visit: Payer: Medicare PPO | Admitting: Internal Medicine

## 2020-09-08 ENCOUNTER — Encounter: Payer: Self-pay | Admitting: Internal Medicine

## 2020-09-08 VITALS — BP 126/80 | HR 76 | Temp 98.3°F | Ht 65.2 in | Wt 250.0 lb

## 2020-09-08 DIAGNOSIS — M542 Cervicalgia: Secondary | ICD-10-CM | POA: Diagnosis not present

## 2020-09-08 DIAGNOSIS — E1165 Type 2 diabetes mellitus with hyperglycemia: Secondary | ICD-10-CM | POA: Diagnosis not present

## 2020-09-08 DIAGNOSIS — Z6841 Body Mass Index (BMI) 40.0 and over, adult: Secondary | ICD-10-CM

## 2020-09-08 DIAGNOSIS — I10 Essential (primary) hypertension: Secondary | ICD-10-CM

## 2020-09-08 DIAGNOSIS — M25511 Pain in right shoulder: Secondary | ICD-10-CM

## 2020-09-08 MED ORDER — KETOROLAC TROMETHAMINE 30 MG/ML IJ SOLN
30.0000 mg | Freq: Once | INTRAMUSCULAR | Status: AC
  Administered 2020-09-08: 30 mg via INTRAMUSCULAR

## 2020-09-08 MED ORDER — CYCLOBENZAPRINE HCL 5 MG PO TABS: ORAL_TABLET | ORAL | 0 refills | Status: DC

## 2020-09-08 NOTE — Patient Instructions (Signed)

## 2020-09-08 NOTE — Progress Notes (Signed)
I,Katawbba Wiggins,acting as a Education administrator for [name], MD.,have documented all relevant documentation on the behalf of [name], MD,as directed by  [name], MD while in the presence of [name], MD.  This visit occurred during the SARS-CoV-2 public health emergency.  Safety protocols were in place, including screening questions prior to the visit, additional usage of staff PPE, and extensive cleaning of exam room while observing appropriate contact time as indicated for disinfecting solutions.  Subjective:     Patient ID: [name] , female    DOB: Jun 16, 1951 , 70 y.o.   MRN: [id number]   Chief Complaint  Patient presents with  . Diabetes  . Hypertension    HPI  She presents today for dm/htn check. She reports compliance with meds. She denies headaches, chest pain and shortness of breath.  She is still exercising regularly.   Diabetes She presents for her follow-up diabetic visit. She has type 2 diabetes mellitus. Her disease course has been improving. There are no hypoglycemic associated symptoms. There are no diabetic associated symptoms. Pertinent negatives for diabetes include no blurred vision and no chest pain. There are no hypoglycemic complications. There are no diabetic complications. Risk factors for coronary artery disease include diabetes mellitus, hypertension, obesity and post-menopausal. She is compliant with treatment most of the time. She is following a generally healthy diet. She participates in exercise intermittently. Her breakfast blood glucose is taken between 8-9 am. Her breakfast blood glucose range is generally 70-90 mg/dl. Eye exam is current.  Hypertension This is a chronic problem. The current episode started more than 1 year ago. The problem is unchanged. The problem is controlled. Associated symptoms include neck pain. Pertinent negatives include no blurred vision, chest pain, orthopnea, palpitations, PND or shortness of breath. The  current treatment provides moderate improvement.     Past Medical History:  Diagnosis Date  . Diabetes mellitus without complication ([place])   . GERD (gastroesophageal reflux disease)   . Headache(784.0)   . High cholesterol   . Hypertension   . Sleep apnea    mild- no CPAP  . Stroke [facility]) 2005   mini- stroke due to HTN     Family History  Problem Relation Age of Onset  . Cancer Mother   . Hypertension Mother   . Cancer Maternal Grandmother   . Hypertension Maternal Grandmother      Current Outpatient Medications:  .  Cholecalciferol (VITAMIN D) 2000 UNITS tablet, Take 2,000 Units by mouth daily. , Disp: , Rfl:  .  cyclobenzaprine (FLEXERIL) 5 MG tablet, One tab po qhs prn, Disp: 30 tablet, Rfl: 0 .  Alcohol Swabs (B-D SINGLE USE SWABS REGULAR) PADS, Use as directed to check blood sugars 1 time per dx: e11.22, Disp: 200 each, Rfl: 3 .  Blood Glucose Calibration (TRUE METRIX LEVEL 1) Low SOLN, Use as directed to check blood sugars 1 time per dx: e11.22, Disp: 2 each, Rfl: 2 .  Blood Glucose Monitoring Suppl (TRUE METRIX AIR GLUCOSE METER) w/Device KIT, Inject 1 kit into the skin daily. Use as directed to check blood sugars 1 time per dx: e11.22, Disp: 1 kit, Rfl: 1 .  diclofenac Sodium (VOLTAREN) 1 % GEL, Place onto the skin., Disp: , Rfl:  .  glucose blood (TRUE METRIX BLOOD GLUCOSE TEST) test strip, USE AS DIRECTED TO CHECK BLOOD SUGARS, Disp: 300 strip, Rfl: 3 .  latanoprost (XALATAN) 0.005 % ophthalmic solution, INSTILL 1 DROP INTO OU HS, Disp: ,  Rfl:  .  levocetirizine (XYZAL) 5 MG tablet, Take 5 mg by mouth daily as needed for allergies., Disp: , Rfl:  .  metFORMIN (GLUCOPHAGE-XR) 750 MG 24 hr tablet, TAKE 1 TABLET EVERY DAY WITH EVENING MEAL, Disp: 90 tablet, Rfl: 3 .  Multiple Vitamin (MULTIVITAMIN WITH MINERALS) TABS tablet, Take 1 tablet by mouth daily., Disp: , Rfl:  .  Olmesartan-amLODIPine-HCTZ 40-5-12.5 MG TABS, TAKE 1 TABLET BY MOUTH  DAILY, Disp: 90 tablet, Rfl: 3 .   pravastatin (PRAVACHOL) 40 MG tablet, Take 1 tablet (40 mg total) by mouth every evening., Disp: 45 tablet, Rfl: 3 .  Semaglutide,0.25 or 0.5MG/DOS, (OZEMPIC, 0.25 OR 0.5 MG/DOSE,) 2 MG/1.5ML SOPN, Inject 0.5 mg into the skin once a week., Disp: 3 pen, Rfl: 3 .  Triamcinolone Acetonide (NASACORT AQ NA), Place into the nose., Disp: , Rfl:  .  TRUEplus Lancets 33G MISC, USE AS DIRECTED TO CHECK BLOOD SUGARS, Disp: 300 each, Rfl: 3   Allergies  Allergen Reactions  . Codeine Nausea And Vomiting and Diarrhea  . Meperidine     Other reaction(s): Delusions (intolerance)     Review of Systems  Constitutional: Negative.   Eyes: Negative for blurred vision.  Respiratory: Negative.  Negative for shortness of breath.   Cardiovascular: Negative.  Negative for chest pain, palpitations, orthopnea and PND.  Gastrointestinal: Negative.   Musculoskeletal: Positive for arthralgias, myalgias (Right side pain, especially with sneezing for about 2 weeks. ) and neck pain.       She c/o r shoulder pain. Denies fall/trauma. There is pain with movement. Sx have progressed over the past month.  Neurological: Negative.   Psychiatric/Behavioral: Negative.      Today's Vitals   09/08/20 1512  BP: 126/80  Pulse: 76  Temp: 98.3 F (36.8 C)  TempSrc: Oral  Weight: 250 lb (113.4 kg)  Height: 5' 5.2" (1.656 m)  PainSc: 7   PainLoc: Back   Body mass index is 41.35 kg/m.  Wt Readings from Last 3 Encounters:  09/08/20 250 lb (113.4 kg)  07/28/20 249 lb 12.8 oz (113.3 kg)  05/05/20 248 lb 9.6 oz (112.8 kg)   Objective:  Physical Exam Vitals and nursing note reviewed.  Constitutional:      Appearance: Normal appearance.  HENT:     Head: Normocephalic and atraumatic.     Nose:     Comments: Masked     Mouth/Throat:     Comments: Masked  Cardiovascular:     Rate and Rhythm: Normal rate and regular rhythm.     Heart sounds: Normal heart sounds.  Pulmonary:     Effort: Pulmonary effort is normal.      Breath sounds: Normal breath sounds.  Musculoskeletal:        General: Tenderness present.     Cervical back: Normal range of motion.     Comments: Pain w/ movement of r shoulder.   Skin:    General: Skin is warm.  Neurological:     General: No focal deficit present.     Mental Status: She is alert.  Psychiatric:        Mood and Affect: Mood normal.        Behavior: Behavior normal.         Assessment And Plan:     1. Type 2 diabetes mellitus with hyperglycemia, without long-term current use of insulin (HCC) Comments: Chroinc, I will check labs as listed below. Advised to aim for at least 150 minutes of exercise per  week.  - Hemoglobin A1c - CMP14+EGFR  2. Essential hypertension, benign Comments: Chronic, well controlled. She will c/w olmesartan, amlodipine and hctz. Advised to follow a low sodium diet.   3. Acute pain of right shoulder Comments: Advised to apply topical pain cream to affected area prn.If no relief, I wll refer her to Ortho for further evaluation.  - ketorolac (TORADOL) 30 MG/ML injection 30 mg  4. Cervicalgia Comments: She was given rx cyclobenzaprine to use nightly prn. She will let me know if her sx persist.   5. Class 3 severe obesity due to excess calories with serious comorbidity and body mass index (BMI) of 40.0 to 44.9 in adult [facility]) Comments: BMI 41. Advised to strive for BMI less than 35 to decrease cardiac risk.   Patient was given opportunity to ask questions. Patient verbalized understanding of the plan and was able to repeat key elements of the plan. All questions were answered to their satisfaction.   I, [name], MD, have reviewed all documentation for this visit. The documentation on 09/10/20 for the exam, diagnosis, procedures, and orders are all accurate and complete.  THE PATIENT IS ENCOURAGED TO PRACTICE SOCIAL DISTANCING DUE TO THE COVID-19 PANDEMIC.

## 2020-09-09 LAB — CMP14+EGFR
ALT: 9 IU/L (ref 0–32)
AST: 15 IU/L (ref 0–40)
Albumin/Globulin Ratio: 1.6 (ref 1.2–2.2)
Albumin: 4.2 g/dL (ref 3.8–4.8)
Alkaline Phosphatase: 91 IU/L (ref 44–121)
BUN/Creatinine Ratio: 23 (ref 12–28)
BUN: 17 mg/dL (ref 8–27)
Bilirubin Total: <0.2 mg/dL (ref 0.0–1.2)
CO2: 21 mmol/L (ref 20–29)
Calcium: 9.5 mg/dL (ref 8.7–10.3)
Chloride: 104 mmol/L (ref 96–106)
Creatinine, Ser: 0.75 mg/dL (ref 0.57–1.00)
GFR calc Af Amer: 94 mL/min/{1.73_m2} (ref >59)
GFR calc non Af Amer: 82 mL/min/{1.73_m2} (ref >59)
Globulin, Total: 2.7 g/dL (ref 1.5–4.5)
Glucose: 94 mg/dL (ref 65–99)
Potassium: 3.7 mmol/L (ref 3.5–5.2)
Sodium: 140 mmol/L (ref 134–144)
Total Protein: 6.9 g/dL (ref 6.0–8.5)

## 2020-09-09 LAB — HEMOGLOBIN A1C
Est. average glucose Bld gHb Est-mCnc: 123 mg/dL
Hgb A1c MFr Bld: 5.9 % — ABNORMAL HIGH (ref 4.8–5.6)

## 2020-09-11 ENCOUNTER — Encounter: Payer: Self-pay | Admitting: Internal Medicine

## 2020-09-13 ENCOUNTER — Telehealth: Payer: Self-pay

## 2020-09-13 NOTE — Chronic Care Management (AMB) (Signed)
    Chronic Care Management Pharmacy Assistant   Name: [name]  MRN: [id number] DOB: [birth year]  Reason for Encounter: Patient Assistance Application   PCP : Glendale Chard, MD  Allergies:   Allergies  Allergen Reactions  . Codeine Nausea And Vomiting and Diarrhea  . Meperidine     Other reaction(s): Delusions (intolerance)    Medications: Outpatient Encounter Medications as of 09/13/2020  Medication Sig  . Alcohol Swabs (B-D SINGLE USE SWABS REGULAR) PADS Use as directed to check blood sugars 1 time per dx: e11.22  . Blood Glucose Calibration (TRUE METRIX LEVEL 1) Low SOLN Use as directed to check blood sugars 1 time per dx: e11.22  . Blood Glucose Monitoring Suppl (TRUE METRIX AIR GLUCOSE METER) w/Device KIT Inject 1 kit into the skin daily. Use as directed to check blood sugars 1 time per dx: e11.22  . Cholecalciferol (VITAMIN D) 2000 UNITS tablet Take 2,000 Units by mouth daily.   . cyclobenzaprine (FLEXERIL) 5 MG tablet One tab po qhs prn  . diclofenac Sodium (VOLTAREN) 1 % GEL Place onto the skin.  Marland Kitchen glucose blood (TRUE METRIX BLOOD GLUCOSE TEST) test strip USE AS DIRECTED TO CHECK BLOOD SUGARS  . latanoprost (XALATAN) 0.005 % ophthalmic solution INSTILL 1 DROP INTO OU HS  . levocetirizine (XYZAL) 5 MG tablet Take 5 mg by mouth daily as needed for allergies.  . metFORMIN (GLUCOPHAGE-XR) 750 MG 24 hr tablet TAKE 1 TABLET EVERY DAY WITH EVENING MEAL  . Multiple Vitamin (MULTIVITAMIN WITH MINERALS) TABS tablet Take 1 tablet by mouth daily.  . Olmesartan-amLODIPine-HCTZ 40-5-12.5 MG TABS TAKE 1 TABLET BY MOUTH  DAILY  . pravastatin (PRAVACHOL) 40 MG tablet Take 1 tablet (40 mg total) by mouth every evening.  . Semaglutide,0.25 or 0.5MG/DOS, (OZEMPIC, 0.25 OR 0.5 MG/DOSE,) 2 MG/1.5ML SOPN Inject 0.5 mg into the skin once a week.  . Triamcinolone Acetonide (NASACORT AQ NA) Place into the nose.  . TRUEplus Lancets 33G MISC USE AS DIRECTED TO CHECK BLOOD SUGARS   No  facility-administered encounter medications on file as of 09/13/2020.    Current Diagnosis: Patient Active Problem List   Diagnosis Date Noted  . OSA on CPAP 07/30/2018  . Left hip pain 07/02/2018  . Snoring 02/27/2018  . Intermittent sleepiness 02/27/2018  . Sleep pattern disturbance 02/27/2018  . Obesity (BMI 35.0-39.9 without comorbidity) 02/27/2018  . Essential hypertension 02/27/2018     Follow-Up:  Patient Assistance Coordination and Pharmacist Review-Called and spoke with the representative from [employer] who stated Ms. Fullman application is incomplete due to page 5 missing out from the application submitted. Informed the representative that PCP's office will resend the missing page to Fax: [phone].  Teams message sent to [facility]. Per Felicity Coyer; I will resend page 5 to [employer].  [name]: 8 minutes to scan and fax.  Orlando Penner, CPP Notified.  Raynelle Highland, [place] Pharmacist Assistant [phone] CCM Total Time: 13 minutes (call with [employer]).

## 2020-09-16 DIAGNOSIS — S29019A Strain of muscle and tendon of unspecified wall of thorax, initial encounter: Secondary | ICD-10-CM | POA: Diagnosis not present

## 2020-09-16 DIAGNOSIS — M5459 Other low back pain: Secondary | ICD-10-CM | POA: Diagnosis not present

## 2020-09-16 DIAGNOSIS — M546 Pain in thoracic spine: Secondary | ICD-10-CM | POA: Diagnosis not present

## 2020-09-16 DIAGNOSIS — M542 Cervicalgia: Secondary | ICD-10-CM | POA: Diagnosis not present

## 2020-09-23 DIAGNOSIS — M546 Pain in thoracic spine: Secondary | ICD-10-CM | POA: Diagnosis not present

## 2020-09-30 ENCOUNTER — Telehealth: Payer: Self-pay

## 2020-09-30 NOTE — Telephone Encounter (Signed)
Pt was notified about Ozempic sample, let her know it was ready and she can come by to pick it up at her earliest convenience next week.

## 2020-10-04 ENCOUNTER — Encounter: Payer: Self-pay | Admitting: Internal Medicine

## 2020-10-04 ENCOUNTER — Other Ambulatory Visit: Payer: Self-pay | Admitting: Internal Medicine

## 2020-10-04 DIAGNOSIS — M546 Pain in thoracic spine: Secondary | ICD-10-CM | POA: Diagnosis not present

## 2020-10-07 DIAGNOSIS — M546 Pain in thoracic spine: Secondary | ICD-10-CM | POA: Diagnosis not present

## 2020-10-11 DIAGNOSIS — M546 Pain in thoracic spine: Secondary | ICD-10-CM | POA: Diagnosis not present

## 2020-11-15 ENCOUNTER — Other Ambulatory Visit: Payer: Self-pay | Admitting: Internal Medicine

## 2020-12-07 ENCOUNTER — Telehealth: Payer: Self-pay

## 2020-12-07 NOTE — Chronic Care Management (AMB) (Signed)
    Chronic Care Management Pharmacy Assistant   Name: MARQUERITE FORSMAN  MRN: [id number] DOB: [birth year]  Reason for Encounter: Patient Assistance Coordination  12/07/2020- Fax received form [place] patient assistance program, patient is eligible to receive a reorder of Ozempic through their patient assistance program and need reorder form filled out and faxed. Reorder form filled out, sent to Orlando Penner, CPP to print and PCP to sign then will fax.   Medications: Outpatient Encounter Medications as of 12/07/2020  Medication Sig  . Alcohol Swabs (B-D SINGLE USE SWABS REGULAR) PADS USE AS DIRECTED TO CHECK BLOOD SUGARS  . Blood Glucose Calibration (TRUE METRIX LEVEL 1) Low SOLN Use as directed to check blood sugars 1 time per dx: e11.22  . Blood Glucose Monitoring Suppl (TRUE METRIX AIR GLUCOSE METER) w/Device KIT Inject 1 kit into the skin daily. Use as directed to check blood sugars 1 time per dx: e11.22  . Cholecalciferol (VITAMIN D) 2000 UNITS tablet Take 2,000 Units by mouth daily.   . cyclobenzaprine (FLEXERIL) 5 MG tablet One tab po qhs prn  . diclofenac Sodium (VOLTAREN) 1 % GEL Place onto the skin.  Marland Kitchen latanoprost (XALATAN) 0.005 % ophthalmic solution INSTILL 1 DROP INTO OU HS  . levocetirizine (XYZAL) 5 MG tablet Take 5 mg by mouth daily as needed for allergies.  . metFORMIN (GLUCOPHAGE-XR) 750 MG 24 hr tablet TAKE 1 TABLET EVERY DAY WITH EVENING MEAL  . Multiple Vitamin (MULTIVITAMIN WITH MINERALS) TABS tablet Take 1 tablet by mouth daily.  . Olmesartan-amLODIPine-HCTZ 40-5-12.5 MG TABS TAKE 1 TABLET BY MOUTH  DAILY  . pravastatin (PRAVACHOL) 40 MG tablet Take 1 tablet (40 mg total) by mouth every evening.  . Semaglutide,0.25 or 0.5MG /DOS, (OZEMPIC, 0.25 OR 0.5 MG/DOSE,) 2 MG/1.5ML SOPN Inject 0.5 mg into the skin once a week.  . Triamcinolone Acetonide (NASACORT AQ NA) Place into the nose.  . TRUE METRIX BLOOD GLUCOSE TEST test strip USE AS DIRECTED TO CHECK BLOOD SUGARS  .  TRUEplus Lancets 33G MISC USE AS DIRECTED TO CHECK BLOOD SUGARS   No facility-administered encounter medications on file as of 12/07/2020.    Star Rating Drugs: Metformin 750 mg- Last filled 03/10/2020 for 90 day supply at [place] filled 08/31/2019 for 1 days supply.  Pravastatin 40mg  - Last filled 08/31/2019 for 90 day supply. Olmesartan-Amlodipine-HCTZ 40-5-12.5 mg- Last filled 02/21/2020 for 90 day supply at [facility].   SIG: [name], [place] Pharmacist Assistant [phone]

## 2020-12-20 DIAGNOSIS — H43823 Vitreomacular adhesion, bilateral: Secondary | ICD-10-CM | POA: Diagnosis not present

## 2020-12-20 DIAGNOSIS — H2513 Age-related nuclear cataract, bilateral: Secondary | ICD-10-CM | POA: Diagnosis not present

## 2020-12-20 DIAGNOSIS — E119 Type 2 diabetes mellitus without complications: Secondary | ICD-10-CM | POA: Diagnosis not present

## 2020-12-20 DIAGNOSIS — H40023 Open angle with borderline findings, high risk, bilateral: Secondary | ICD-10-CM | POA: Diagnosis not present

## 2020-12-20 LAB — HM DIABETES EYE EXAM

## 2020-12-22 ENCOUNTER — Encounter: Payer: Self-pay | Admitting: Internal Medicine

## 2021-01-12 ENCOUNTER — Ambulatory Visit: Payer: Medicare PPO

## 2021-01-12 ENCOUNTER — Encounter: Payer: Medicare PPO | Admitting: Internal Medicine

## 2021-01-19 ENCOUNTER — Ambulatory Visit (INDEPENDENT_AMBULATORY_CARE_PROVIDER_SITE_OTHER): Payer: Medicare PPO

## 2021-01-19 ENCOUNTER — Other Ambulatory Visit: Payer: Self-pay

## 2021-01-19 VITALS — BP 138/80 | HR 78 | Temp 98.1°F | Ht 64.8 in | Wt 249.0 lb

## 2021-01-19 DIAGNOSIS — Z0000 Encounter for general adult medical examination without abnormal findings: Secondary | ICD-10-CM

## 2021-01-19 NOTE — Progress Notes (Signed)
This visit occurred during the SARS-CoV-2 public health emergency.  Safety protocols were in place, including screening questions prior to the visit, additional usage of staff PPE, and extensive cleaning of exam room while observing appropriate contact time as indicated for disinfecting solutions.  Subjective:   [name] is a 70 y.o. female who presents for Medicare Annual (Subsequent) preventive examination.  Review of Systems     Cardiac Risk Factors include: advanced age (>67mn, >>50women);diabetes mellitus;hypertension;obesity (BMI >30kg/m2)     Objective:    Today's Vitals   01/19/21 1425  BP: 138/80  Pulse: 78  Temp: 98.1 F (36.7 C)  TempSrc: Oral  SpO2: 96%  Weight: 249 lb (112.9 kg)  Height: 5' 4.8" (1.646 m)   Body mass index is 41.69 kg/m.  Advanced Directives 01/19/2021 12/30/2019 01/06/2019 04/23/2018 02/02/2013 01/30/2013  Does Patient Have a Medical Advance Directive? No No No No Patient does not have advance directive Patient does not have advance directive  Would patient like information on creating a medical advance directive? No - Patient declined Yes (MAU/Ambulatory/Procedural Areas - Information given) No - Patient declined Yes (MAU/Ambulatory/Procedural Areas - Information given) - -    Current Medications (verified) Outpatient Encounter Medications as of 01/19/2021  Medication Sig   Alcohol Swabs (B-D SINGLE USE SWABS REGULAR) PADS USE AS DIRECTED TO CHECK BLOOD SUGARS   Blood Glucose Calibration (TRUE METRIX LEVEL 1) Low SOLN Use as directed to check blood sugars 1 time per dx: e11.22   Blood Glucose Monitoring Suppl (TRUE METRIX AIR GLUCOSE METER) w/Device KIT Inject 1 kit into the skin daily. Use as directed to check blood sugars 1 time per dx: e11.22   Cholecalciferol (VITAMIN D) 2000 UNITS tablet Take 2,000 Units by mouth daily.    cyclobenzaprine (FLEXERIL) 5 MG tablet One tab po qhs prn   diclofenac Sodium (VOLTAREN) 1 % GEL Place onto the skin.    latanoprost (XALATAN) 0.005 % ophthalmic solution INSTILL 1 DROP INTO OU HS   levocetirizine (XYZAL) 5 MG tablet Take 5 mg by mouth daily as needed for allergies.   metFORMIN (GLUCOPHAGE-XR) 750 MG 24 hr tablet TAKE 1 TABLET EVERY DAY WITH EVENING MEAL   Multiple Vitamin (MULTIVITAMIN WITH MINERALS) TABS tablet Take 1 tablet by mouth daily.   Olmesartan-amLODIPine-HCTZ 40-5-12.5 MG TABS TAKE 1 TABLET BY MOUTH  DAILY   pravastatin (PRAVACHOL) 40 MG tablet Take 1 tablet (40 mg total) by mouth every evening.   Semaglutide,0.25 or 0.5MG/DOS, (OZEMPIC, 0.25 OR 0.5 MG/DOSE,) 2 MG/1.5ML SOPN Inject 0.5 mg into the skin once a week.   Triamcinolone Acetonide (NASACORT AQ NA) Place into the nose.   TRUE METRIX BLOOD GLUCOSE TEST test strip USE AS DIRECTED TO CHECK BLOOD SUGARS   TRUEplus Lancets 33G MISC USE AS DIRECTED TO CHECK BLOOD SUGARS   No facility-administered encounter medications on file as of 01/19/2021.    Allergies (verified) Codeine and Meperidine   History: Past Medical History:  Diagnosis Date   Diabetes mellitus without complication (HCC)    GERD (gastroesophageal reflux disease)    Headache(784.0)    High cholesterol    Hypertension    Sleep apnea    mild- no CPAP   [place]   mini- stroke due to HTN   Past Surgical History:  Procedure Laterality Date   CHEST TUBE INSERTION  2004   lung punctured during shoulder surgery   FOOT SURGERY     bilateral foot surgery   HYSTEROSCOPY WITH D &  C N/A 02/02/2013   Procedure: DILATATION AND CURETTAGE /HYSTEROSCOPY;  Surgeon: Thurnell Lose, MD;  Location: [facility];  Service: Gynecology;  Laterality: N/A;   KNEE ARTHROSCOPY     SHOULDER ARTHROSCOPY     TUBAL LIGATION     Family History  Problem Relation Age of Onset   Cancer Mother    Hypertension Mother    Cancer Maternal Grandmother    Hypertension Maternal Grandmother    Social History   Socioeconomic History   Marital status: Single    Spouse name: Not on file    Number of children: Not on file   Years of education: Not on file   Highest education level: Not on file  Occupational History   Occupation: retired  Tobacco Use   Smoking status: Never   Smokeless tobacco: Never  Vaping Use   Vaping Use: Never used  Substance and Sexual Activity   Alcohol use: Yes    Comment: occassionally   Drug use: No   Sexual activity: Not Currently  Other Topics Concern   Not on file  Social History Narrative   Not on file   Social Determinants of Health   Financial Resource Strain: Low Risk    Difficulty of Paying Living Expenses: Not hard at all  Food Insecurity: No Food Insecurity   Worried About Charity fundraiser in the Last Year: Never true   [place] in the Last Year: Never true  Transportation Needs: No Transportation Needs   Lack of Transportation (Medical): No   Lack of Transportation (Non-Medical): No  Physical Activity: Insufficiently Active   Days of Exercise per Week: 4 days   Minutes of Exercise per Session: 30 min  Stress: No Stress Concern Present   Feeling of Stress : Not at all  Social Connections: Not on file    Tobacco Counseling Counseling given: Not Answered   Clinical Intake:  Pre-visit preparation completed: Yes  Pain : No/denies pain     Nutritional Status: BMI > 30  Obese Nutritional Risks: None Diabetes: Yes  How often do you need to have someone help you when you read instructions, pamphlets, or other written materials from your doctor or pharmacy?: 1 - Never  Diabetic?yes Nutrition Risk Assessment:  Has the patient had any N/V/D within the last 2 months?  No  Does the patient have any non-healing wounds?  No  Has the patient had any unintentional weight loss or weight gain?  No   Diabetes:  Is the patient diabetic?  Yes  If diabetic, was a CBG obtained today?  No  Did the patient bring in their glucometer from home?  No  How often do you monitor your CBG's? daily.   Financial Strains  and Diabetes Management:  Are you having any financial strains with the device, your supplies or your medication? No .  Does the patient want to be seen by Chronic Care Management for management of their diabetes?  No  Would the patient like to be referred to a Nutritionist or for Diabetic Management?  No   Diabetic Exams:  Diabetic Eye Exam: Completed 12/20/2020 Diabetic Foot Exam: Overdue, Pt has been advised about the importance in completing this exam. Pt is scheduled for diabetic foot exam on next appointment.          Activities of Daily Living In your present state of health, do you have any difficulty performing the following activities: 01/19/2021  Hearing? N  Vision? N  Difficulty concentrating  or making decisions? N  Walking or climbing stairs? N  Dressing or bathing? N  Doing errands, shopping? N  Preparing Food and eating ? N  Using the Toilet? N  In the past six months, have you accidently leaked urine? N  Do you have problems with loss of bowel control? N  Managing your Medications? N  Managing your Finances? N  Housekeeping or managing your Housekeeping? N  Some recent data might be hidden    Patient Care Team: Glendale Chard, MD as PCP - General (Internal Medicine) Warden Fillers, MD as Consulting Physician (Ophthalmology)  Indicate any recent Medical Services you may have received from other than [facility] providers in the past year (date may be approximate).     Assessment:   This is a routine wellness examination for [name].  Hearing/Vision screen Vision Screening - Comments:: Regular eye exams, Dr. Katy Fitch  Dietary issues and exercise activities discussed: Current Exercise Habits: Home exercise routine, Type of exercise: strength training/weights;calisthenics, Time (Minutes): 30, Frequency (Times/Week): 4, Weekly Exercise (Minutes/Week): 120   Goals Addressed             This Visit's Progress    Patient Stated       01/19/2021, wants to weigh 230  pounds        Depression Screen PHQ 2/9 Scores 01/19/2021 12/30/2019 01/06/2019 10/14/2018 07/02/2018 06/02/2018 05/19/2018  PHQ - 2 Score 0 0 0 0 0 0 0  PHQ- 9 Score - 0 0 - - - -    Fall Risk Fall Risk  01/19/2021 12/30/2019 02/17/2019 01/06/2019 10/14/2018  Falls in the past year? 0 0 0 0 0  Risk for fall due to : Medication side effect Medication side effect - Medication side effect -  Follow up Falls evaluation completed;Education provided;Falls prevention discussed Falls evaluation completed;Education provided;Falls prevention discussed - Education provided;Falls prevention discussed -    FALL RISK PREVENTION PERTAINING TO THE HOME:  Any stairs in or around the home? Yes  If so, are there any without handrails? no Home free of loose throw rugs in walkways, pet beds, electrical cords, etc? Yes  Adequate lighting in your home to reduce risk of falls? Yes   ASSISTIVE DEVICES UTILIZED TO PREVENT FALLS:  Life alert? No  Use of a cane, walker or w/c? No  Grab bars in the bathroom? Yes  Shower chair or bench in shower? No  Elevated toilet seat or a handicapped toilet? Yes   TIMED UP AND GO:  Was the test performed? No .    Gait steady and fast without use of assistive device  Cognitive Function:     6CIT Screen 01/19/2021 12/30/2019 01/06/2019 04/23/2018  What Year? 0 points 0 points 0 points 0 points  What month? 0 points 0 points 0 points 0 points  What time? 0 points 0 points 0 points 0 points  Count back from 20 0 points 0 points 0 points 0 points  Months in reverse 0 points 0 points 0 points 0 points  Repeat phrase 0 points 0 points 0 points 0 points  Total Score 0 0 0 0    Immunizations Immunization History  Administered Date(s) Administered   Fluad Quad(high Dose 65+) 05/05/2020   Influenza, High Dose Seasonal PF 04/23/2018, 06/29/2019, 08/12/2019   PFIZER(Purple Top)SARS-COV-2 Vaccination 09/25/2019, 10/16/2019   Pneumococcal Conjugate-13 09/04/2019   Pneumococcal  Polysaccharide-23 04/23/2018   Tdap 12/26/2017    TDAP status: Up to date  Flu Vaccine status: Up to date  Pneumococcal  vaccine status: Up to date  Covid-19 vaccine status: Completed vaccines  Qualifies for Shingles Vaccine? Yes   Zostavax completed No   Shingrix Completed?: No.    Education has been provided regarding the importance of this vaccine. Patient has been advised to call insurance company to determine out of pocket expense if they have not yet received this vaccine. Advised may also receive vaccine at local pharmacy or Health Dept. Verbalized acceptance and understanding.  Screening Tests Health Maintenance  Topic Date Due   Zoster Vaccines- Shingrix (1 of 2) Never done   COVID-19 Vaccine (3 - Booster for Pfizer series) 03/17/2020   INFLUENZA VACCINE  02/13/2021   MAMMOGRAM  08/09/2021   COLONOSCOPY (Pts 45-35yr Insurance coverage will need to be confirmed)  10/30/2022   TETANUS/TDAP  12/27/2027   DEXA SCAN  Completed   Hepatitis C Screening  Completed   PNA vac Low Risk Adult  Completed   HPV VACCINES  Aged Out    Health Maintenance  Health Maintenance Due  Topic Date Due   Zoster Vaccines- Shingrix (1 of 2) Never done   COVID-19 Vaccine (3 - Booster for Pfizer series) 03/17/2020    Colorectal cancer screening: Type of screening: Colonoscopy. Completed 10/29/2012. Repeat every 10 years  Mammogram status: Completed 08/09/2020. Repeat every year  Bone Density status: Completed 05/28/2016.   Lung Cancer Screening: (Low Dose CT Chest recommended if Age 70-80years, 30 pack-year currently smoking OR have quit w/in 15years.) does not qualify.   Lung Cancer Screening Referral: no  Additional Screening:  Hepatitis C Screening: does qualify; Completed 09/09/2012  Vision Screening: Recommended annual ophthalmology exams for early detection of glaucoma and other disorders of the eye. Is the patient up to date with their annual eye exam?  Yes  Who is the provider  or what is the name of the office in which the patient attends annual eye exams? Dr. GKaty FitchIf pt is not established with a provider, would they like to be referred to a provider to establish care? No .   Dental Screening: Recommended annual dental exams for proper oral hygiene  Community Resource Referral / Chronic Care Management: CRR required this visit?  No   CCM required this visit?  No      Plan:     I have personally reviewed and noted the following in the patient's chart:   Medical and social history Use of alcohol, tobacco or illicit drugs  Current medications and supplements including opioid prescriptions.  Functional ability and status Nutritional status Physical activity Advanced directives List of other physicians Hospitalizations, surgeries, and ER visits in previous 12 months Vitals Screenings to include cognitive, depression, and falls Referrals and appointments  In addition, I have reviewed and discussed with patient certain preventive protocols, quality metrics, and best practice recommendations. A written personalized care plan for preventive services as well as general preventive health recommendations were provided to patient.     NKellie Simmering LPN   [id number]  Nurse Notes:

## 2021-01-19 NOTE — Patient Instructions (Signed)
[name] , Thank you for taking time to come for your Medicare Wellness Visit. I appreciate your ongoing commitment to your health goals. Please review the following plan we discussed and let me know if I can assist you in the future.   Screening recommendations/referrals: Colonoscopy: completed 10/29/2012 Mammogram: completed 08/09/2020 Bone Density: completed 05/28/2016 Recommended yearly ophthalmology/optometry visit for glaucoma screening and checkup Recommended yearly dental visit for hygiene and checkup  Vaccinations: Influenza vaccine: completed 05/05/2020, due 02/13/2021 Pneumococcal vaccine: completed 09/04/2019 Tdap vaccine: completed 12/26/2017, due 12/27/2027 Shingles vaccine: discussed   Covid-19: 10/16/2019, 09/25/2019  Advanced directives: Advance directive discussed with you today. Even though you declined this today please call our office should you change your mind and we can give you the proper paperwork for you to fill out.  Conditions/risks identified: none  Next appointment: Follow up in one year for your annual wellness visit    Preventive Care 65 Years and Older, Female Preventive care refers to lifestyle choices and visits with your health care provider that can promote health and wellness. What does preventive care include? A yearly physical exam. This is also called an annual well check. Dental exams once or twice a year. Routine eye exams. Ask your health care provider how often you should have your eyes checked. Personal lifestyle choices, including: Daily care of your teeth and gums. Regular physical activity. Eating a healthy diet. Avoiding tobacco and drug use. Limiting alcohol use. Practicing safe sex. Taking low-dose aspirin every day. Taking vitamin and mineral supplements as recommended by your health care provider. What happens during an annual well check? The services and screenings done by your health care provider during your annual well check  will depend on your age, overall health, lifestyle risk factors, and family history of disease. Counseling  Your health care provider may ask you questions about your: Alcohol use. Tobacco use. Drug use. Emotional well-being. Home and relationship well-being. Sexual activity. Eating habits. History of falls. Memory and ability to understand (cognition). Work and work Statistician. Reproductive health. Screening  You may have the following tests or measurements: Height, weight, and BMI. Blood pressure. Lipid and cholesterol levels. These may be checked every 5 years, or more frequently if you are over 9 years old. Skin check. Lung cancer screening. You may have this screening every year starting at age 48 if you have a 30-pack-year history of smoking and currently smoke or have quit within the past 15 years. Fecal occult blood test (FOBT) of the stool. You may have this test every year starting at age 27. Flexible sigmoidoscopy or colonoscopy. You may have a sigmoidoscopy every 5 years or a colonoscopy every 10 years starting at age 38. Hepatitis C blood test. Hepatitis B blood test. Sexually transmitted disease (STD) testing. Diabetes screening. This is done by checking your blood sugar (glucose) after you have not eaten for a while (fasting). You may have this done every 1-3 years. Bone density scan. This is done to screen for osteoporosis. You may have this done starting at age 26. Mammogram. This may be done every 1-2 years. Talk to your health care provider about how often you should have regular mammograms. Talk with your health care provider about your test results, treatment options, and if necessary, the need for more tests. Vaccines  Your health care provider may recommend certain vaccines, such as: Influenza vaccine. This is recommended every year. Tetanus, diphtheria, and acellular pertussis (Tdap, Td) vaccine. You may need a Td booster every 10  years. Zoster vaccine. You  may need this after age 80. Pneumococcal 13-valent conjugate (PCV13) vaccine. One dose is recommended after age 16. Pneumococcal polysaccharide (PPSV23) vaccine. One dose is recommended after age 25. Talk to your health care provider about which screenings and vaccines you need and how often you need them. This information is not intended to replace advice given to you by your health care provider. Make sure you discuss any questions you have with your health care provider. Document Released: 07/29/2015 Document Revised: 03/21/2016 Document Reviewed: 05/03/2015 Elsevier Interactive Patient Education  2017 [place] Prevention in the Home Falls can cause injuries. They can happen to people of all ages. There are many things you can do to make your home safe and to help prevent falls. What can I do on the outside of my home? Regularly fix the edges of walkways and driveways and fix any cracks. Remove anything that might make you trip as you walk through a door, such as a raised step or threshold. Trim any bushes or trees on the path to your home. Use bright outdoor lighting. Clear any walking paths of anything that might make someone trip, such as rocks or tools. Regularly check to see if handrails are loose or broken. Make sure that both sides of any steps have handrails. Any raised decks and porches should have guardrails on the edges. Have any leaves, snow, or ice cleared regularly. Use sand or salt on walking paths during winter. Clean up any spills in your garage right away. This includes oil or grease spills. What can I do in the bathroom? Use night lights. Install grab bars by the toilet and in the tub and shower. Do not use towel bars as grab bars. Use non-skid mats or decals in the tub or shower. If you need to sit down in the shower, use a plastic, non-slip stool. Keep the floor dry. Clean up any water that spills on the floor as soon as it happens. Remove soap buildup  in the tub or shower regularly. Attach bath mats securely with double-sided non-slip rug tape. Do not have throw rugs and other things on the floor that can make you trip. What can I do in the bedroom? Use night lights. Make sure that you have a light by your bed that is easy to reach. Do not use any sheets or blankets that are too big for your bed. They should not hang down onto the floor. Have a firm chair that has side arms. You can use this for support while you get dressed. Do not have throw rugs and other things on the floor that can make you trip. What can I do in the kitchen? Clean up any spills right away. Avoid walking on wet floors. Keep items that you use a lot in easy-to-reach places. If you need to reach something above you, use a strong step stool that has a grab bar. Keep electrical cords out of the way. Do not use floor polish or wax that makes floors slippery. If you must use wax, use non-skid floor wax. Do not have throw rugs and other things on the floor that can make you trip. What can I do with my stairs? Do not leave any items on the stairs. Make sure that there are handrails on both sides of the stairs and use them. Fix handrails that are broken or loose. Make sure that handrails are as long as the stairways. Check any carpeting to make sure  that it is firmly attached to the stairs. Fix any carpet that is loose or worn. Avoid having throw rugs at the top or bottom of the stairs. If you do have throw rugs, attach them to the floor with carpet tape. Make sure that you have a light switch at the top of the stairs and the bottom of the stairs. If you do not have them, ask someone to add them for you. What else can I do to help prevent falls? Wear shoes that: Do not have high heels. Have rubber bottoms. Are comfortable and fit you well. Are closed at the toe. Do not wear sandals. If you use a stepladder: Make sure that it is fully opened. Do not climb a closed  stepladder. Make sure that both sides of the stepladder are locked into place. Ask someone to hold it for you, if possible. Clearly mark and make sure that you can see: Any grab bars or handrails. First and last steps. Where the edge of each step is. Use tools that help you move around (mobility aids) if they are needed. These include: Canes. Walkers. Scooters. Crutches. Turn on the lights when you go into a dark area. Replace any light bulbs as soon as they burn out. Set up your furniture so you have a clear path. Avoid moving your furniture around. If any of your floors are uneven, fix them. If there are any pets around you, be aware of where they are. Review your medicines with your doctor. Some medicines can make you feel dizzy. This can increase your chance of falling. Ask your doctor what other things that you can do to help prevent falls. This information is not intended to replace advice given to you by your health care provider. Make sure you discuss any questions you have with your health care provider. Document Released: 04/28/2009 Document Revised: 12/08/2015 Document Reviewed: 08/06/2014 Elsevier Interactive Patient Education  2017 [employer].

## 2021-01-23 ENCOUNTER — Encounter: Payer: Medicare PPO | Admitting: Internal Medicine

## 2021-02-18 ENCOUNTER — Other Ambulatory Visit: Payer: Self-pay | Admitting: Internal Medicine

## 2021-02-18 MED ORDER — AMOXICILLIN-POT CLAVULANATE 875-125 MG PO TABS: 1.0000 | ORAL_TABLET | Freq: Two times a day (BID) | ORAL | 0 refills | Status: AC

## 2021-02-19 DIAGNOSIS — Z20822 Contact with and (suspected) exposure to covid-19: Secondary | ICD-10-CM | POA: Diagnosis not present

## 2021-02-21 ENCOUNTER — Encounter: Payer: Self-pay | Admitting: Internal Medicine

## 2021-02-21 ENCOUNTER — Telehealth: Payer: Self-pay

## 2021-02-21 NOTE — Telephone Encounter (Signed)
The pt was scheduled a virtual appt because she is covid positive.

## 2021-02-22 ENCOUNTER — Encounter: Payer: Self-pay | Admitting: Nurse Practitioner

## 2021-02-22 ENCOUNTER — Telehealth: Payer: Medicare PPO | Admitting: Nurse Practitioner

## 2021-02-22 DIAGNOSIS — U071 COVID-19: Secondary | ICD-10-CM

## 2021-02-22 DIAGNOSIS — E1165 Type 2 diabetes mellitus with hyperglycemia: Secondary | ICD-10-CM | POA: Diagnosis not present

## 2021-02-22 NOTE — Progress Notes (Signed)
Virtual Visit via My Chart   This visit type was conducted due to national recommendations for restrictions regarding the COVID-19 Pandemic (e.g. social distancing) in an effort to limit this patient's exposure and mitigate transmission in our community.  Due to her co-morbid illnesses, this patient is at least at moderate risk for complications without adequate follow up.  This format is felt to be most appropriate for this patient at this time.  All issues noted in this document were discussed and addressed.  A limited physical exam was performed with this format.    This visit type was conducted due to national recommendations for restrictions regarding the COVID-19 Pandemic (e.g. social distancing) in an effort to limit this patient's exposure and mitigate transmission in our community.  Patients identity confirmed using two different identifiers.  This format is felt to be most appropriate for this patient at this time.  All issues noted in this document were discussed and addressed.  No physical exam was performed (except for noted visual exam findings with Video Visits).    Date:  02/22/2021   ID:  [name], [name] 04/14/1951, MRN [id number]  Patient Location:  Home - spoke with Inge Rise  Provider location:   Office    Chief Complaint:  positive for covid  History of Present Illness:    [name] is a 70 y.o. female who presents via video conferencing for a telehealth visit today.    The patient does have symptoms concerning for COVID-19 infection (fever, chills, cough, or new shortness of breath).   She tested positive on 8/3 then when she retested at home on 8/3 was negative, then was advised [facility] and that test was positive. She is positive for covid on 8/6, symptoms began on 8/3 - nausea, vomiting, headache, "just did not feel well", low grade fever 99.9.  She did not take any additional medications. Denies any shortness of breath.  Blood sugar has been ranging  89-113   She is feeling better. If she moves around too much gets tired quick. Has a decreased appetite. Slight headache. No nausea.     Past Medical History:  Diagnosis Date   Diabetes mellitus without complication (HCC)    GERD (gastroesophageal reflux disease)    Headache(784.0)    High cholesterol    Hypertension    Sleep apnea    mild- no CPAP   Stroke ([place]) 2005   mini- stroke due to HTN   Past Surgical History:  Procedure Laterality Date   CHEST TUBE INSERTION  2004   lung punctured during shoulder surgery   FOOT SURGERY     bilateral foot surgery   HYSTEROSCOPY WITH D & C N/A 02/02/2013   Procedure: DILATATION AND CURETTAGE /HYSTEROSCOPY;  Surgeon: Thurnell Lose, MD;  Location: [facility];  Service: Gynecology;  Laterality: N/A;   KNEE ARTHROSCOPY     SHOULDER ARTHROSCOPY     TUBAL LIGATION       Current Meds  Medication Sig   Alcohol Swabs (B-D SINGLE USE SWABS REGULAR) PADS USE AS DIRECTED TO CHECK BLOOD SUGARS   amoxicillin-clavulanate (AUGMENTIN) 875-125 MG tablet Take 1 tablet by mouth 2 (two) times daily for 7 days.   Blood Glucose Calibration (TRUE METRIX LEVEL 1) Low SOLN Use as directed to check blood sugars 1 time per dx: e11.22   Blood Glucose Monitoring Suppl (TRUE METRIX AIR GLUCOSE METER) w/Device KIT Inject 1 kit into the skin daily. Use as directed to  check blood sugars 1 time per dx: e11.22   Cholecalciferol (VITAMIN D) 2000 UNITS tablet Take 2,000 Units by mouth daily.    cyclobenzaprine (FLEXERIL) 5 MG tablet One tab po qhs prn   diclofenac Sodium (VOLTAREN) 1 % GEL Place onto the skin.   latanoprost (XALATAN) 0.005 % ophthalmic solution INSTILL 1 DROP INTO OU HS   levocetirizine (XYZAL) 5 MG tablet Take 5 mg by mouth daily as needed for allergies.   metFORMIN (GLUCOPHAGE-XR) 750 MG 24 hr tablet TAKE 1 TABLET EVERY DAY WITH EVENING MEAL   Multiple Vitamin (MULTIVITAMIN WITH MINERALS) TABS tablet Take 1 tablet by mouth daily.    Olmesartan-amLODIPine-HCTZ 40-5-12.5 MG TABS TAKE 1 TABLET BY MOUTH  DAILY   Semaglutide,0.25 or 0.5MG/DOS, (OZEMPIC, 0.25 OR 0.5 MG/DOSE,) 2 MG/1.5ML SOPN Inject 0.5 mg into the skin once a week.   Triamcinolone Acetonide (NASACORT AQ NA) Place into the nose.   TRUE METRIX BLOOD GLUCOSE TEST test strip USE AS DIRECTED TO CHECK BLOOD SUGARS   TRUEplus Lancets 33G MISC USE AS DIRECTED TO CHECK BLOOD SUGARS     Allergies:   Codeine and Meperidine   Social History   Tobacco Use   Smoking status: Never   Smokeless tobacco: Never  Vaping Use   Vaping Use: Never used  Substance Use Topics   Alcohol use: Yes    Comment: occassionally   Drug use: No     Family Hx: The patient's family history includes Cancer in her maternal grandmother and mother; Hypertension in her maternal grandmother and mother.  ROS:   Please see the history of present illness.    Review of Systems  Constitutional:  Positive for malaise/fatigue.  HENT:  Positive for congestion.   Respiratory:  Positive for cough.   Neurological:  Positive for headaches.   All other systems reviewed and are negative.   Labs/Other Tests and Data Reviewed:    Recent Labs: 09/08/2020: ALT 9; BUN 17; Creatinine, Ser 0.75; Potassium 3.7; Sodium 140   Recent Lipid Panel Lab Results  Component Value Date/Time   CHOL 188 12/30/2019 03:32 PM   TRIG 111 12/30/2019 03:32 PM   HDL 61 12/30/2019 03:32 PM   CHOLHDL 3.1 12/30/2019 03:32 PM   LDLCALC 107 (H) 12/30/2019 03:32 PM    Wt Readings from Last 3 Encounters:  01/19/21 249 lb (112.9 kg)  09/08/20 250 lb (113.4 kg)  07/28/20 249 lb 12.8 oz (113.3 kg)     Exam:    Vital Signs:  LMP 01/23/2013     Physical Exam Vitals reviewed.  Constitutional:      General: She is not in acute distress.    Appearance: Normal appearance.  Pulmonary:     Effort: Pulmonary effort is normal. No respiratory distress.  Neurological:     General: No focal deficit present.     Mental  Status: She is alert and oriented to person, place, and time. Mental status is at baseline.     Cranial Nerves: No cranial nerve deficit.  Psychiatric:        Mood and Affect: Mood and affect normal.        Behavior: Behavior normal.        Thought Content: Thought content normal.        Cognition and Memory: Memory normal.        Judgment: Judgment normal.    ASSESSMENT & PLAN:     1. [facility] - she started with  symptoms on 8/3, she is improving. If has shortness of breath or chest pain go to ER. She is at day 7 and out of the day range for Paxlovid. Continue with symptomatic treatment  2. Type 2 diabetes mellitus with hyperglycemia, without long-term current use of insulin (HCC) Her blood sugars are within normal range, she is aware if worsens to call to office   COVID-19 Education: The signs and symptoms of COVID-19 were discussed with the patient and how to seek care for testing (follow up with PCP or arrange E-visit).  The importance of social distancing was discussed today.  Patient Risk:   After full review of this patients clinical status, I feel that they are at least moderate risk at this time.  Time:   Today, I have spent 14 minutes/ seconds with the patient with telehealth technology discussing above diagnoses.     Medication Adjustments/Labs and Tests Ordered: Current medicines are reviewed at length with the patient today.  Concerns regarding medicines are outlined above.   Tests Ordered: No orders of the defined types were placed in this encounter.   Medication Changes: No orders of the defined types were placed in this encounter.   Disposition:  Follow up prn  Signed, Minette Brine, FNP

## 2021-02-22 NOTE — Patient Instructions (Signed)
10 Things You Can Do to Manage Your COVID-19 Symptoms at Home If you have possible or confirmed COVID-19 Stay home except to get medical care. Monitor your symptoms carefully. If your symptoms get worse, call your healthcare provider immediately. Get rest and stay hydrated. If you have a medical appointment, call the healthcare provider ahead of time and tell them that you have or may have COVID-19. For medical emergencies, call 911 and notify the dispatch personnel that you have or may have COVID-19. Cover your cough and sneezes with a tissue or use the inside of your elbow. Wash your hands often with soap and water for at least 20 seconds or clean your hands with an alcohol-based hand sanitizer that contains at least 60% alcohol. As much as possible, stay in a specific room and away from other people in your home. Also, you should use a separate bathroom, if available. If you need to be around other people in or outside of the home, wear a mask. Avoid sharing personal items with other people in your household, like dishes, towels, and bedding. Clean all surfaces that are touched often, like counters, tabletops, and doorknobs. Use household cleaning sprays or wipes according to the label instructions. cdc.gov/coronavirus 01/29/2020 This information is not intended to replace advice given to you by your health care provider. Make sure you discuss any questions you have with your healthcare provider. Document Revised: 08/19/2020 Document Reviewed: 08/19/2020 Elsevier Patient Education  2022 [employer] Inc.  

## 2021-03-23 ENCOUNTER — Other Ambulatory Visit: Payer: Self-pay | Admitting: Internal Medicine

## 2021-03-30 ENCOUNTER — Encounter: Payer: Self-pay | Admitting: Internal Medicine

## 2021-03-30 ENCOUNTER — Other Ambulatory Visit: Payer: Self-pay

## 2021-03-30 ENCOUNTER — Ambulatory Visit (INDEPENDENT_AMBULATORY_CARE_PROVIDER_SITE_OTHER): Payer: Medicare PPO | Admitting: Internal Medicine

## 2021-03-30 VITALS — BP 134/88 | HR 77 | Temp 98.6°F | Ht 64.8 in | Wt 245.4 lb

## 2021-03-30 DIAGNOSIS — Z6841 Body Mass Index (BMI) 40.0 and over, adult: Secondary | ICD-10-CM

## 2021-03-30 DIAGNOSIS — E66813 Obesity, class 3: Secondary | ICD-10-CM

## 2021-03-30 DIAGNOSIS — I10 Essential (primary) hypertension: Secondary | ICD-10-CM | POA: Diagnosis not present

## 2021-03-30 DIAGNOSIS — Z0000 Encounter for general adult medical examination without abnormal findings: Secondary | ICD-10-CM | POA: Diagnosis not present

## 2021-03-30 DIAGNOSIS — Z23 Encounter for immunization: Secondary | ICD-10-CM | POA: Diagnosis not present

## 2021-03-30 DIAGNOSIS — E7800 Pure hypercholesterolemia, unspecified: Secondary | ICD-10-CM | POA: Diagnosis not present

## 2021-03-30 DIAGNOSIS — E785 Hyperlipidemia, unspecified: Secondary | ICD-10-CM

## 2021-03-30 DIAGNOSIS — E1165 Type 2 diabetes mellitus with hyperglycemia: Secondary | ICD-10-CM | POA: Diagnosis not present

## 2021-03-30 LAB — POCT UA - MICROALBUMIN
Albumin/Creatinine Ratio, Urine, POC: <30
Creatinine, POC: 200 mg/dL
Microalbumin Ur, POC: 10 mg/L

## 2021-03-30 LAB — POCT URINALYSIS DIPSTICK
Bilirubin, UA: NEGATIVE
Blood, UA: NEGATIVE
Glucose, UA: NEGATIVE
Ketones, UA: NEGATIVE
Leukocytes, UA: NEGATIVE
Nitrite, UA: NEGATIVE
Protein, UA: NEGATIVE
Spec Grav, UA: 1.02 (ref 1.010–1.025)
Urobilinogen, UA: 0.2 E.U./dL
pH, UA: 5 (ref 5.0–8.0)

## 2021-03-30 MED ORDER — ZOSTER VAC RECOMB ADJUVANTED 50 MCG/0.5ML IM SUSR: 0.5000 mL | Freq: Once | INTRAMUSCULAR | 0 refills | Status: AC

## 2021-03-30 NOTE — Patient Instructions (Signed)
Health Maintenance, Female Adopting a healthy lifestyle and getting preventive care are important in promoting health and wellness. Ask your health care provider about: The right schedule for you to have regular tests and exams. Things you can do on your own to prevent diseases and keep yourself healthy. What should I know about diet, weight, and exercise? Eat a healthy diet  Eat a diet that includes plenty of vegetables, fruits, low-fat dairy products, and lean protein. Do not eat a lot of foods that are high in solid fats, added sugars, or sodium. Maintain a healthy weight Body mass index (BMI) is used to identify weight problems. It estimates body fat based on height and weight. Your health care provider can help determine your BMI and help you achieve or maintain a healthy weight. Get regular exercise Get regular exercise. This is one of the most important things you can do for your health. Most adults should: Exercise for at least 150 minutes each week. The exercise should increase your heart rate and make you sweat (moderate-intensity exercise). Do strengthening exercises at least twice a week. This is in addition to the moderate-intensity exercise. Spend less time sitting. Even light physical activity can be beneficial. Watch cholesterol and blood lipids Have your blood tested for lipids and cholesterol at 70 years of age, then have this test every 5 years. Have your cholesterol levels checked more often if: Your lipid or cholesterol levels are high. You are older than 70 years of age. You are at high risk for heart disease. What should I know about cancer screening? Depending on your health history and family history, you may need to have cancer screening at various ages. This may include screening for: Breast cancer. Cervical cancer. Colorectal cancer. Skin cancer. Lung cancer. What should I know about heart disease, diabetes, and high blood pressure? Blood pressure and heart  disease High blood pressure causes heart disease and increases the risk of stroke. This is more likely to develop in people who have high blood pressure readings, are of African descent, or are overweight. Have your blood pressure checked: Every 3-5 years if you are 18-39 years of age. Every year if you are 40 years old or older. Diabetes Have regular diabetes screenings. This checks your fasting blood sugar level. Have the screening done: Once every three years after age 40 if you are at a normal weight and have a low risk for diabetes. More often and at a younger age if you are overweight or have a high risk for diabetes. What should I know about preventing infection? Hepatitis B If you have a higher risk for hepatitis B, you should be screened for this virus. Talk with your health care provider to find out if you are at risk for hepatitis B infection. Hepatitis C Testing is recommended for: Everyone born from 1945 through 1965. Anyone with known risk factors for hepatitis C. Sexually transmitted infections (STIs) Get screened for STIs, including gonorrhea and chlamydia, if: You are sexually active and are younger than 70 years of age. You are older than 70 years of age and your health care provider tells you that you are at risk for this type of infection. Your sexual activity has changed since you were last screened, and you are at increased risk for chlamydia or gonorrhea. Ask your health care provider if you are at risk. Ask your health care provider about whether you are at high risk for HIV. Your health care provider may recommend a prescription medicine   to help prevent HIV infection. If you choose to take medicine to prevent HIV, you should first get tested for HIV. You should then be tested every 3 months for as long as you are taking the medicine. Pregnancy If you are about to stop having your period (premenopausal) and you may become pregnant, seek counseling before you get  pregnant. Take 400 to 800 micrograms (mcg) of folic acid every day if you become pregnant. Ask for birth control (contraception) if you want to prevent pregnancy. Osteoporosis and menopause Osteoporosis is a disease in which the bones lose minerals and strength with aging. This can result in bone fractures. If you are 65 years old or older, or if you are at risk for osteoporosis and fractures, ask your health care provider if you should: Be screened for bone loss. Take a calcium or vitamin D supplement to lower your risk of fractures. Be given hormone replacement therapy (HRT) to treat symptoms of menopause. Follow these instructions at home: Lifestyle Do not use any products that contain nicotine or tobacco, such as cigarettes, e-cigarettes, and chewing tobacco. If you need help quitting, ask your health care provider. Do not use street drugs. Do not share needles. Ask your health care provider for help if you need support or information about quitting drugs. Alcohol use Do not drink alcohol if: Your health care provider tells you not to drink. You are pregnant, may be pregnant, or are planning to become pregnant. If you drink alcohol: Limit how much you use to 0-1 drink a day. Limit intake if you are breastfeeding. Be aware of how much alcohol is in your drink. In the [place] one drink equals one 12 oz bottle of beer (355 mL), one 5 oz glass of wine (148 mL), or one 1 oz glass of hard liquor (44 mL). General instructions Schedule regular health, dental, and eye exams. Stay current with your vaccines. Tell your health care provider if: You often feel depressed. You have ever been abused or do not feel safe at home. Summary Adopting a healthy lifestyle and getting preventive care are important in promoting health and wellness. Follow your health care provider's instructions about healthy diet, exercising, and getting tested or screened for diseases. Follow your health care provider's  instructions on monitoring your cholesterol and blood pressure. This information is not intended to replace advice given to you by your health care provider. Make sure you discuss any questions you have with your health care provider. Document Revised: 09/09/2020 Document Reviewed: 06/25/2018 Elsevier Patient Education  2022 [employer] Inc.  

## 2021-03-30 NOTE — Progress Notes (Signed)
I,YAMILKA J Llittleton,acting as a Education administrator for [name], MD.,have documented all relevant documentation on the behalf of [name], MD,as directed by  [name], MD while in the presence of [name], MD.  This visit occurred during the SARS-CoV-2 public health emergency.  Safety protocols were in place, including screening questions prior to the visit, additional usage of staff PPE, and extensive cleaning of exam room while observing appropriate contact time as indicated for disinfecting solutions.  Subjective:     Patient ID: [name] , female    DOB: [birth year] , 70 y.o.   MRN: [id number]   Chief Complaint  Patient presents with   Annual Exam   Diabetes   Hypertension    HPI  Patient here from [facility]. She is no longer followed by a GYN provider. She reports compliance with meds. She denies having any chest pains, visual disturbances and palpitations.   Diabetes She presents for her follow-up diabetic visit. She has type 2 diabetes mellitus. Her disease course has been improving. There are no hypoglycemic associated symptoms. There are no diabetic associated symptoms. Pertinent negatives for diabetes include no blurred vision and no chest pain. There are no hypoglycemic complications. There are no diabetic complications. Risk factors for coronary artery disease include diabetes mellitus, hypertension, obesity and post-menopausal. She is compliant with treatment most of the time. Her breakfast blood glucose is taken between 8-9 am. Her breakfast blood glucose range is generally 70-90 mg/dl. Eye exam is current.  Hypertension This is a chronic problem. The current episode started more than 1 year ago. The problem is unchanged. The problem is controlled. Pertinent negatives include no blurred vision, chest pain, neck pain, orthopnea, palpitations, PND or shortness of breath.    Past Medical History:  Diagnosis Date   Diabetes mellitus without complication (HCC)     GERD (gastroesophageal reflux disease)    Headache(784.0)    High cholesterol    Hypertension    Sleep apnea    mild- no CPAP   Stroke ([place]) 2005   mini- stroke due to HTN     Family History  Problem Relation Age of Onset   Cancer Mother    Hypertension Mother    Cancer Maternal Grandmother    Hypertension Maternal Grandmother      Current Outpatient Medications:    Alcohol Swabs (B-D SINGLE USE SWABS REGULAR) PADS, USE AS DIRECTED TO CHECK BLOOD SUGARS, Disp: 300 each, Rfl: 3   Blood Glucose Calibration (TRUE METRIX LEVEL 1) Low SOLN, Use as directed to check blood sugars 1 time per dx: e11.22, Disp: 2 each, Rfl: 2   Blood Glucose Monitoring Suppl (TRUE METRIX AIR GLUCOSE METER) w/Device KIT, Inject 1 kit into the skin daily. Use as directed to check blood sugars 1 time per dx: e11.22, Disp: 1 kit, Rfl: 1   Cholecalciferol (VITAMIN D) 2000 UNITS tablet, Take 2,000 Units by mouth daily. , Disp: , Rfl:    cyclobenzaprine (FLEXERIL) 5 MG tablet, One tab po qhs prn, Disp: 30 tablet, Rfl: 0   diclofenac Sodium (VOLTAREN) 1 % GEL, Place onto the skin., Disp: , Rfl:    latanoprost (XALATAN) 0.005 % ophthalmic solution, INSTILL 1 DROP INTO OU HS, Disp: , Rfl:    levocetirizine (XYZAL) 5 MG tablet, Take 5 mg by mouth daily as needed for allergies., Disp: , Rfl:    metFORMIN (GLUCOPHAGE-XR) 750 MG 24 hr tablet, TAKE 1 TABLET EVERY DAY WITH EVENING MEAL, Disp: 90 tablet,  Rfl: 3   Multiple Vitamin (MULTIVITAMIN WITH MINERALS) TABS tablet, Take 1 tablet by mouth daily., Disp: , Rfl:    Olmesartan-amLODIPine-HCTZ 40-5-12.5 MG TABS, TAKE 1 TABLET BY MOUTH  DAILY, Disp: 90 tablet, Rfl: 3   Semaglutide,0.25 or 0.5MG/DOS, (OZEMPIC, 0.25 OR 0.5 MG/DOSE,) 2 MG/1.5ML SOPN, Inject 0.5 mg into the skin once a week., Disp: 3 pen, Rfl: 3   Triamcinolone Acetonide (NASACORT AQ NA), Place into the nose., Disp: , Rfl:    TRUE METRIX BLOOD GLUCOSE TEST test strip, USE AS DIRECTED TO CHECK BLOOD SUGARS, Disp:  300 strip, Rfl: 3   TRUEplus Lancets 33G MISC, USE AS DIRECTED TO CHECK BLOOD SUGARS, Disp: 300 each, Rfl: 3   Allergies  Allergen Reactions   Codeine Nausea And Vomiting and Diarrhea   Meperidine     Other reaction(s): Delusions (intolerance)      The patient states she uses post menopausal status for birth control. Last LMP was Patient's last menstrual period was 01/23/2013.. Negative for Dysmenorrhea. Negative for: breast discharge, breast lump(s), breast pain and breast self exam. Associated symptoms include abnormal vaginal bleeding. Pertinent negatives include abnormal bleeding (hematology), anxiety, decreased libido, depression, difficulty falling sleep, dyspareunia, history of infertility, nocturia, sexual dysfunction, sleep disturbances, urinary incontinence, urinary urgency, vaginal discharge and vaginal itching. Diet regular.The patient states her exercise level is    . The patient's tobacco use is:  Social History   Tobacco Use  Smoking Status Never  Smokeless Tobacco Never  . She has been exposed to passive smoke. The patient's alcohol use is:  Social History   Substance and Sexual Activity  Alcohol Use Yes   Comment: occassionally   Review of Systems  Constitutional: Negative.   HENT: Negative.    Eyes: Negative.  Negative for blurred vision.  Respiratory: Negative.  Negative for shortness of breath.   Cardiovascular: Negative.  Negative for chest pain, palpitations, orthopnea and PND.  Gastrointestinal: Negative.   Endocrine: Negative.   Genitourinary: Negative.   Musculoskeletal: Negative.  Negative for neck pain.  Skin: Negative.   Allergic/Immunologic: Negative.   Neurological: Negative.   Hematological: Negative.   Psychiatric/Behavioral: Negative.      Today's Vitals   03/30/21 1454  BP: 134/88  Pulse: 77  Temp: 98.6 F (37 C)  Weight: 245 lb 6.4 oz (111.3 kg)  Height: 5' 4.8" (1.646 m)  PainSc: 0-No pain   Body mass index is 41.09 kg/m.  Wt  Readings from Last 3 Encounters:  03/30/21 245 lb 6.4 oz (111.3 kg)  01/19/21 249 lb (112.9 kg)  09/08/20 250 lb (113.4 kg)     Objective:  Physical Exam Vitals and nursing note reviewed.  Constitutional:      Appearance: Normal appearance. She is obese.  HENT:     Head: Normocephalic and atraumatic.     Right Ear: Tympanic membrane, ear canal and external ear normal.     Left Ear: Tympanic membrane, ear canal and external ear normal.     Nose:     Comments: Masked     Mouth/Throat:     Comments: Masked  Eyes:     Extraocular Movements: Extraocular movements intact.     Conjunctiva/sclera: Conjunctivae normal.     Pupils: Pupils are equal, round, and reactive to light.  Cardiovascular:     Rate and Rhythm: Normal rate and regular rhythm.     Pulses: Normal pulses.          Dorsalis pedis pulses are 2+ on the  right side and 2+ on the left side.     Heart sounds: Normal heart sounds.  Pulmonary:     Effort: Pulmonary effort is normal.     Breath sounds: Normal breath sounds.  Chest:  Breasts:    Tanner Score is 5.     Right: Normal.     Left: Normal.  Abdominal:     General: Bowel sounds are normal.     Palpations: Abdomen is soft.     Comments: Soft, obese  Genitourinary:    Comments: deferred Musculoskeletal:        General: Normal range of motion.     Cervical back: Normal range of motion and neck supple.  Feet:     Right foot:     Protective Sensation: 5 sites tested.  5 sites sensed.     Skin integrity: Dry skin present.     Toenail Condition: Right toenails are abnormally thick.     Left foot:     Protective Sensation: 5 sites tested.  5 sites sensed.     Skin integrity: Dry skin present.     Toenail Condition: Left toenails are abnormally thick.  Skin:    General: Skin is warm and dry.  Neurological:     General: No focal deficit present.     Mental Status: She is alert and oriented to person, place, and time.  Psychiatric:        Mood and Affect: Mood  normal.        Behavior: Behavior normal.        Assessment And Plan:     1. Encounter for general adult medical examination w/o abnormal findings Comments: A full exam was performed. Importance of monthly self breast exams was discussed with the patient. PATIENT IS ADVISED TO GET 30-45 MINUTES REGULAR EXERCISE NO LESS THAN FOUR TO FIVE DAYS PER WEEK - BOTH WEIGHTBEARING EXERCISES AND AEROBIC ARE RECOMMENDED.  PATIENT IS ADVISED TO FOLLOW A HEALTHY DIET WITH AT LEAST SIX FRUITS/VEGGIES PER DAY, DECREASE INTAKE OF RED MEAT, AND TO INCREASE FISH INTAKE TO TWO DAYS PER WEEK.  MEATS/FISH SHOULD NOT BE FRIED, BAKED OR BROILED IS PREFERABLE.  IT IS ALSO IMPORTANT TO CUT BACK ON YOUR SUGAR INTAKE. PLEASE AVOID ANYTHING WITH ADDED SUGAR, CORN SYRUP OR OTHER SWEETENERS. IF YOU MUST USE A SWEETENER, YOU CAN TRY STEVIA. IT IS ALSO IMPORTANT TO AVOID ARTIFICIALLY SWEETENERS AND DIET BEVERAGES. LASTLY, I SUGGEST WEARING SPF 50 SUNSCREEN ON EXPOSED PARTS AND ESPECIALLY WHEN IN THE DIRECT SUNLIGHT FOR AN EXTENDED PERIOD OF TIME.  PLEASE AVOID FAST FOOD RESTAURANTS AND INCREASE YOUR WATER INTAKE.   2. Type 2 diabetes mellitus with hyperglycemia, without long-term current use of insulin (HCC) Comments: Diabetic foot exam was performed. I DISCUSSED WITH THE PATIENT AT LENGTH REGARDING THE GOALS OF GLYCEMIC CONTROL AND POSSIBLE LONG-TERM COMPLICATIONS.  I  ALSO STRESSED THE IMPORTANCE OF COMPLIANCE WITH HOME GLUCOSE MONITORING, DIETARY RESTRICTIONS INCLUDING AVOIDANCE OF SUGARY DRINKS/PROCESSED FOODS,  ALONG WITH REGULAR EXERCISE.  I  ALSO STRESSED THE IMPORTANCE OF ANNUAL EYE EXAMS, SELF FOOT CARE AND COMPLIANCE WITH OFFICE VISITS.  - POCT Urinalysis Dipstick (81002) - POCT UA - Microalbumin - CBC - Hemoglobin A1c  3. Essential hypertension, benign Comments: Chronic, well controlled. EKG performed, NSR w/o acute changes. Advised to follow low sodium diet. She will f/u in six months for re-evaluation.  - EKG  12-Lead - CMP14+EGFR  4. Pure hypercholesterolemia Comments: Chronic, not on statin therapy. I will check lipid panel  today. I plan to initiate statin therapy after reviewing her labs.  - Lipid panel  5. Class 3 severe obesity due to excess calories with serious comorbidity and body mass index (BMI) of 40.0 to 44.9 in adult [facility]) Comments: BMI 41. She is encouraged to aim to lose ten percent of her body weight, 24 lbs to decrease cardiac risk.   6. Immunization due Comments: I will send rx Shingrix to her local pharmacy.   She was also given flu vaccine, high dose.  - Flu Vaccine QUAD High Dose(Fluad) - Zoster Vaccine Adjuvanted [facility]) injection; Inject 0.5 mLs into the muscle once for 1 dose.  Dispense: 0.5 mL; Refill: 0  Patient was given opportunity to ask questions. Patient verbalized understanding of the plan and was able to repeat key elements of the plan. All questions were answered to their satisfaction.   I, [name], MD, have reviewed all documentation for this visit. The documentation on 04/02/21 for the exam, diagnosis, procedures, and orders are all accurate and complete.  THE PATIENT IS ENCOURAGED TO PRACTICE SOCIAL DISTANCING DUE TO THE COVID-19 PANDEMIC.

## 2021-03-31 LAB — CBC
Hematocrit: 43.6 % (ref 34.0–46.6)
Hemoglobin: 13.7 g/dL (ref 11.1–15.9)
MCH: 25 pg — ABNORMAL LOW (ref 26.6–33.0)
MCHC: 31.4 g/dL — ABNORMAL LOW (ref 31.5–35.7)
MCV: 80 fL (ref 79–97)
Platelets: 347 10*3/uL (ref 150–450)
RBC: 5.47 x10E6/uL — ABNORMAL HIGH (ref 3.77–5.28)
RDW: 14.5 % (ref 11.7–15.4)
WBC: 5.6 10*3/uL (ref 3.4–10.8)

## 2021-03-31 LAB — LIPID PANEL
Chol/HDL Ratio: 3.5 ratio (ref 0.0–4.4)
Cholesterol, Total: 187 mg/dL (ref 100–199)
HDL: 53 mg/dL (ref >39)
LDL Chol Calc (NIH): 113 mg/dL — ABNORMAL HIGH (ref 0–99)
Triglycerides: 119 mg/dL (ref 0–149)
VLDL Cholesterol Cal: 21 mg/dL (ref 5–40)

## 2021-03-31 LAB — CMP14+EGFR
ALT: 12 IU/L (ref 0–32)
AST: 18 IU/L (ref 0–40)
Albumin/Globulin Ratio: 1.5 (ref 1.2–2.2)
Albumin: 4.1 g/dL (ref 3.8–4.8)
Alkaline Phosphatase: 95 IU/L (ref 44–121)
BUN/Creatinine Ratio: 23 (ref 12–28)
BUN: 16 mg/dL (ref 8–27)
Bilirubin Total: 0.2 mg/dL (ref 0.0–1.2)
CO2: 23 mmol/L (ref 20–29)
Calcium: 9.3 mg/dL (ref 8.7–10.3)
Chloride: 103 mmol/L (ref 96–106)
Creatinine, Ser: 0.71 mg/dL (ref 0.57–1.00)
Globulin, Total: 2.8 g/dL (ref 1.5–4.5)
Glucose: 90 mg/dL (ref 65–99)
Potassium: 4.1 mmol/L (ref 3.5–5.2)
Sodium: 141 mmol/L (ref 134–144)
Total Protein: 6.9 g/dL (ref 6.0–8.5)
eGFR: 91 mL/min/{1.73_m2} (ref >59)

## 2021-03-31 LAB — HEMOGLOBIN A1C
Est. average glucose Bld gHb Est-mCnc: 128 mg/dL
Hgb A1c MFr Bld: 6.1 % — ABNORMAL HIGH (ref 4.8–5.6)

## 2021-04-04 ENCOUNTER — Telehealth: Payer: Self-pay

## 2021-04-04 NOTE — Chronic Care Management (AMB) (Signed)
04/04/2021- Patient aware Ozempic arrived to PCP office from [employer] patient assistance program. Patient stated she will be by sometime this week to pick up.  [name], [place] Pharmacist Assistant [phone]'

## 2021-05-26 ENCOUNTER — Telehealth: Payer: Self-pay

## 2021-05-26 NOTE — Chronic Care Management (AMB) (Signed)
    Chronic Care Management Pharmacy Assistant   Name: [name]  MRN: [id number] DOB: 01/26/1951  Reason for Encounter: 2023 PAP   Medications: Outpatient Encounter Medications as of 05/26/2021  Medication Sig   Alcohol Swabs (B-D SINGLE USE SWABS REGULAR) PADS USE AS DIRECTED TO CHECK BLOOD SUGARS   Blood Glucose Calibration (TRUE METRIX LEVEL 1) Low SOLN Use as directed to check blood sugars 1 time per dx: e11.22   Blood Glucose Monitoring Suppl (TRUE METRIX AIR GLUCOSE METER) w/Device KIT Inject 1 kit into the skin daily. Use as directed to check blood sugars 1 time per dx: e11.22   Cholecalciferol (VITAMIN D) 2000 UNITS tablet Take 2,000 Units by mouth daily.    cyclobenzaprine (FLEXERIL) 5 MG tablet One tab po qhs prn   diclofenac Sodium (VOLTAREN) 1 % GEL Place onto the skin.   latanoprost (XALATAN) 0.005 % ophthalmic solution INSTILL 1 DROP INTO OU HS   levocetirizine (XYZAL) 5 MG tablet Take 5 mg by mouth daily as needed for allergies.   metFORMIN (GLUCOPHAGE-XR) 750 MG 24 hr tablet TAKE 1 TABLET EVERY DAY WITH EVENING MEAL   Multiple Vitamin (MULTIVITAMIN WITH MINERALS) TABS tablet Take 1 tablet by mouth daily.   Olmesartan-amLODIPine-HCTZ 40-5-12.5 MG TABS TAKE 1 TABLET BY MOUTH  DAILY   Semaglutide,0.25 or 0.5MG/DOS, (OZEMPIC, 0.25 OR 0.5 MG/DOSE,) 2 MG/1.5ML SOPN Inject 0.5 mg into the skin once a week.   Triamcinolone Acetonide (NASACORT AQ NA) Place into the nose.   TRUE METRIX BLOOD GLUCOSE TEST test strip USE AS DIRECTED TO CHECK BLOOD SUGARS   TRUEplus Lancets 33G MISC USE AS DIRECTED TO CHECK BLOOD SUGARS   No facility-administered encounter medications on file as of 05/26/2021.   05-26-2021: Initiated 2023 patient assistance application. Left patient voicemail that application will be mailed out to sign and return to the office with income.  [place] Pharmacist Assistant [phone]

## 2021-06-05 ENCOUNTER — Encounter: Payer: Self-pay | Admitting: Internal Medicine

## 2021-06-27 DIAGNOSIS — H40023 Open angle with borderline findings, high risk, bilateral: Secondary | ICD-10-CM | POA: Diagnosis not present

## 2021-06-29 DIAGNOSIS — M79642 Pain in left hand: Secondary | ICD-10-CM | POA: Insufficient documentation

## 2021-06-29 DIAGNOSIS — G5600 Carpal tunnel syndrome, unspecified upper limb: Secondary | ICD-10-CM | POA: Insufficient documentation

## 2021-06-29 DIAGNOSIS — M79641 Pain in right hand: Secondary | ICD-10-CM | POA: Insufficient documentation

## 2021-06-29 DIAGNOSIS — G5603 Carpal tunnel syndrome, bilateral upper limbs: Secondary | ICD-10-CM | POA: Diagnosis not present

## 2021-07-01 DIAGNOSIS — M19049 Primary osteoarthritis, unspecified hand: Secondary | ICD-10-CM | POA: Insufficient documentation

## 2021-07-05 ENCOUNTER — Other Ambulatory Visit: Payer: Self-pay | Admitting: Internal Medicine

## 2021-07-05 DIAGNOSIS — Z1231 Encounter for screening mammogram for malignant neoplasm of breast: Secondary | ICD-10-CM

## 2021-07-19 DIAGNOSIS — G5603 Carpal tunnel syndrome, bilateral upper limbs: Secondary | ICD-10-CM | POA: Diagnosis not present

## 2021-08-01 ENCOUNTER — Encounter: Payer: Self-pay | Admitting: Internal Medicine

## 2021-08-02 ENCOUNTER — Other Ambulatory Visit: Payer: Self-pay | Admitting: Internal Medicine

## 2021-08-02 ENCOUNTER — Telehealth: Payer: Self-pay

## 2021-08-02 NOTE — Chronic Care Management (AMB) (Signed)
° ° °  Chronic Care Management Pharmacy Assistant   Name: [name]  MRN: [id number] DOB: Mar 11, 1951   Reason for Encounter: Patient assistance   Medications: Outpatient Encounter Medications as of 08/02/2021  Medication Sig   Alcohol Swabs (B-D SINGLE USE SWABS REGULAR) PADS USE AS DIRECTED TO CHECK BLOOD SUGARS   Blood Glucose Calibration (TRUE METRIX LEVEL 1) Low SOLN Use as directed to check blood sugars 1 time per dx: e11.22   Blood Glucose Monitoring Suppl (TRUE METRIX AIR GLUCOSE METER) w/Device KIT Inject 1 kit into the skin daily. Use as directed to check blood sugars 1 time per dx: e11.22   Cholecalciferol (VITAMIN D) 2000 UNITS tablet Take 2,000 Units by mouth daily.    cyclobenzaprine (FLEXERIL) 5 MG tablet One tab po qhs prn   diclofenac Sodium (VOLTAREN) 1 % GEL Place onto the skin.   latanoprost (XALATAN) 0.005 % ophthalmic solution INSTILL 1 DROP INTO OU HS   levocetirizine (XYZAL) 5 MG tablet Take 5 mg by mouth daily as needed for allergies.   metFORMIN (GLUCOPHAGE-XR) 750 MG 24 hr tablet TAKE 1 TABLET EVERY DAY WITH EVENING MEAL   Multiple Vitamin (MULTIVITAMIN WITH MINERALS) TABS tablet Take 1 tablet by mouth daily.   Olmesartan-amLODIPine-HCTZ 40-5-12.5 MG TABS TAKE 1 TABLET BY MOUTH  DAILY   Semaglutide,0.25 or 0.5MG/DOS, (OZEMPIC, 0.25 OR 0.5 MG/DOSE,) 2 MG/1.5ML SOPN Inject 0.5 mg into the skin once a week.   Triamcinolone Acetonide (NASACORT AQ NA) Place into the nose.   TRUE METRIX BLOOD GLUCOSE TEST test strip USE AS DIRECTED TO CHECK BLOOD SUGARS   TRUEplus Lancets 33G MISC USE AS DIRECTED TO CHECK BLOOD SUGARS   No facility-administered encounter medications on file as of 08/02/2021.   08-02-2020: Patient sent a my chart message stating [employer] sent a letter informing her that 4010 application for ozempic was missing some information. Informed patient that application will be refaxed today. Sent [name] PTM a message to refax.  [place] Pharmacist Assistant [phone]

## 2021-08-07 DIAGNOSIS — M13849 Other specified arthritis, unspecified hand: Secondary | ICD-10-CM | POA: Diagnosis not present

## 2021-08-07 DIAGNOSIS — G5603 Carpal tunnel syndrome, bilateral upper limbs: Secondary | ICD-10-CM | POA: Diagnosis not present

## 2021-08-07 DIAGNOSIS — M79641 Pain in right hand: Secondary | ICD-10-CM | POA: Diagnosis not present

## 2021-08-09 ENCOUNTER — Ambulatory Visit: Payer: Medicare PPO | Admitting: Internal Medicine

## 2021-08-09 ENCOUNTER — Encounter: Payer: Self-pay | Admitting: Internal Medicine

## 2021-08-09 ENCOUNTER — Other Ambulatory Visit: Payer: Self-pay

## 2021-08-09 VITALS — BP 126/82 | HR 68 | Temp 98.2°F | Ht 65.2 in | Wt 238.8 lb

## 2021-08-09 DIAGNOSIS — I10 Essential (primary) hypertension: Secondary | ICD-10-CM | POA: Diagnosis not present

## 2021-08-09 DIAGNOSIS — G5603 Carpal tunnel syndrome, bilateral upper limbs: Secondary | ICD-10-CM | POA: Diagnosis not present

## 2021-08-09 DIAGNOSIS — Z6839 Body mass index (BMI) 39.0-39.9, adult: Secondary | ICD-10-CM

## 2021-08-09 DIAGNOSIS — Z23 Encounter for immunization: Secondary | ICD-10-CM | POA: Diagnosis not present

## 2021-08-09 DIAGNOSIS — E1165 Type 2 diabetes mellitus with hyperglycemia: Secondary | ICD-10-CM | POA: Diagnosis not present

## 2021-08-09 MED ORDER — ZOSTER VAC RECOMB ADJUVANTED 50 MCG/0.5ML IM SUSR: 0.5000 mL | Freq: Once | INTRAMUSCULAR | 0 refills | Status: AC

## 2021-08-09 NOTE — Patient Instructions (Signed)

## 2021-08-09 NOTE — Progress Notes (Signed)
[name],acting as a Education administrator for [name], MD.,have documented all relevant documentation on the behalf of [name], MD,as directed by  [name], MD while in the presence of [name], MD.  This visit occurred during the SARS-CoV-2 public health emergency.  Safety protocols were in place, including screening questions prior to the visit, additional usage of staff PPE, and extensive cleaning of exam room while observing appropriate contact time as indicated for disinfecting solutions.  Subjective:     Patient ID: [name] , female    DOB: 05/08/1951 , 71 y.o.   MRN: [id number]   Chief Complaint  Patient presents with   Diabetes   Hypertension    HPI  She presents today for dm/htn check. She reports compliance with meds. She denies headaches, chest pain and shortness of breath.   Diabetes She presents for her follow-up diabetic visit. She has type 2 diabetes mellitus. Her disease course has been improving. There are no hypoglycemic associated symptoms. There are no diabetic associated symptoms. Pertinent negatives for diabetes include no blurred vision, no chest pain, no polydipsia, no polyphagia and no polyuria. There are no hypoglycemic complications. There are no diabetic complications. Risk factors for coronary artery disease include diabetes mellitus, hypertension, obesity and post-menopausal. She is compliant with treatment most of the time. She is following a generally healthy diet. She participates in exercise intermittently. Her breakfast blood glucose is taken between 8-9 am. Her breakfast blood glucose range is generally 70-90 mg/dl. Eye exam is current.  Hypertension This is a chronic problem. The current episode started more than 1 year ago. The problem is unchanged. The problem is controlled. Pertinent negatives include no blurred vision, chest pain, orthopnea, palpitations, PND or shortness of breath. The current treatment provides moderate  improvement.    Past Medical History:  Diagnosis Date   Diabetes mellitus without complication (HCC)    GERD (gastroesophageal reflux disease)    Headache(784.0)    High cholesterol    Hypertension    Sleep apnea    mild- no CPAP   Stroke ([place]) 2005   mini- stroke due to HTN     Family History  Problem Relation Age of Onset   Cancer Mother    Hypertension Mother    Cancer Maternal Grandmother    Hypertension Maternal Grandmother      Current Outpatient Medications:    Alcohol Swabs (DROPSAFE ALCOHOL PREP) 70 % PADS, USE AS DIRECTED WHEN CHECKING BLOOD SUGARS, Disp: 300 each, Rfl: 3   Blood Glucose Calibration (TRUE METRIX LEVEL 1) Low SOLN, Use as directed to check blood sugars 1 time per dx: e11.22, Disp: 2 each, Rfl: 2   Blood Glucose Monitoring Suppl (TRUE METRIX AIR GLUCOSE METER) w/Device KIT, Inject 1 kit into the skin daily. Use as directed to check blood sugars 1 time per dx: e11.22, Disp: 1 kit, Rfl: 1   Cholecalciferol (VITAMIN D) 2000 UNITS tablet, Take 2,000 Units by mouth daily. , Disp: , Rfl:    cyclobenzaprine (FLEXERIL) 5 MG tablet, One tab po qhs prn, Disp: 30 tablet, Rfl: 0   diclofenac Sodium (VOLTAREN) 1 % GEL, Place onto the skin., Disp: , Rfl:    latanoprost (XALATAN) 0.005 % ophthalmic solution, INSTILL 1 DROP INTO OU HS, Disp: , Rfl:    levocetirizine (XYZAL) 5 MG tablet, Take 5 mg by mouth daily as needed for allergies., Disp: , Rfl:    metFORMIN (GLUCOPHAGE-XR) 750 MG 24 hr tablet, TAKE  1 TABLET EVERY DAY WITH EVENING MEAL, Disp: 90 tablet, Rfl: 3   Multiple Vitamin (MULTIVITAMIN WITH MINERALS) TABS tablet, Take 1 tablet by mouth daily., Disp: , Rfl:    Olmesartan-amLODIPine-HCTZ 40-5-12.5 MG TABS, TAKE 1 TABLET EVERY DAY, Disp: 90 tablet, Rfl: 3   Semaglutide,0.25 or 0.5MG/DOS, (OZEMPIC, 0.25 OR 0.5 MG/DOSE,) 2 MG/1.5ML SOPN, Inject 0.5 mg into the skin once a week., Disp: 3 pen, Rfl: 3   Triamcinolone Acetonide (NASACORT AQ NA), Place into the nose.,  Disp: , Rfl:    TRUE METRIX BLOOD GLUCOSE TEST test strip, USE AS DIRECTED TO CHECK BLOOD SUGARS, Disp: 300 strip, Rfl: 3   TRUEplus Lancets 33G MISC, USE AS DIRECTED TO CHECK BLOOD SUGARS, Disp: 300 each, Rfl: 3   Zoster Vaccine Adjuvanted (SHINGRIX) injection, Inject 0.5 mLs into the muscle once for 1 dose., Disp: 0.5 mL, Rfl: 0   Allergies  Allergen Reactions   Codeine Nausea And Vomiting and Diarrhea   Meperidine     Other reaction(s): Delusions (intolerance)     Review of Systems  Constitutional: Negative.   Eyes:  Negative for blurred vision.  Respiratory: Negative.  Negative for shortness of breath.   Cardiovascular: Negative.  Negative for chest pain, palpitations, orthopnea and PND.  Endocrine: Negative for polydipsia, polyphagia and polyuria.  Musculoskeletal:  Positive for arthralgias.       She has b/l wrist pain. She has been eval for CTS, followed by Hand specialist. States she is scheduled for surgery March 2023.   Neurological: Negative.   Psychiatric/Behavioral: Negative.      Today's Vitals   08/09/21 1103  BP: 126/82  Pulse: 68  Temp: 98.2 F (36.8 C)  Weight: 238 lb 12.8 oz (108.3 kg)  Height: 5' 5.2" (1.656 m)  PainSc: 0-No pain   Body mass index is 39.49 kg/m.  Wt Readings from Last 3 Encounters:  08/09/21 238 lb 12.8 oz (108.3 kg)  03/30/21 245 lb 6.4 oz (111.3 kg)  01/19/21 249 lb (112.9 kg)     Objective:  Physical Exam Vitals and nursing note reviewed.  Constitutional:      Appearance: Normal appearance.  HENT:     Head: Normocephalic and atraumatic.     Nose:     Comments: Masked     Mouth/Throat:     Comments: [place] Eyes:     Extraocular Movements: Extraocular movements intact.  Cardiovascular:     Rate and Rhythm: Normal rate and regular rhythm.     Heart sounds: Normal heart sounds.  Pulmonary:     Effort: Pulmonary effort is normal.     Breath sounds: Normal breath sounds.  Musculoskeletal:     Cervical back: Normal range of  motion.  Skin:    General: Skin is warm.  Neurological:     General: No focal deficit present.     Mental Status: She is alert.  Psychiatric:        Mood and Affect: Mood normal.        Behavior: Behavior normal.        Assessment And Plan:     1. Type 2 diabetes mellitus with hyperglycemia, without long-term current use of insulin (HCC) Comments: Chronic, I will check labs as below.  I will adjust meds as needed.  She will f/u in 4 months for re-evaluation.  - Hemoglobin A1c  2. Essential hypertension, benign Comments: Chronic, well controlled. No med changes. Advised to follow low sodium diet.  - CMP14+EGFR  3. Bilateral carpal  tunnel syndrome Comments: She is scheduled for surgery on the left March 2023. She plans to have surgery on R hand later in the year.  4. Class 2 severe obesity due to excess calories with serious comorbidity and body mass index (BMI) of 39.0 to 39.9 in adult [facility]) Comments: She was congratulated on her 7 lb weight loss since Sept 2022. She is encouraged to aim for BMI <30 to decrease cardiac risk.   5. Immunization due Comments: I will send rx Shingrix to her local pharmacy.  - Zoster Vaccine Adjuvanted [facility]) injection; Inject 0.5 mLs into the muscle once for 1 dose.  Dispense: 0.5 mL; Refill: 0   Patient was given opportunity to ask questions. Patient verbalized understanding of the plan and was able to repeat key elements of the plan. All questions were answered to their satisfaction.   I, [name], MD, have reviewed all documentation for this visit. The documentation on 08/09/21 for the exam, diagnosis, procedures, and orders are all accurate and complete.   IF YOU HAVE BEEN REFERRED TO A SPECIALIST, IT MAY TAKE 1-2 WEEKS TO SCHEDULE/PROCESS THE REFERRAL. IF YOU HAVE NOT HEARD FROM US/SPECIALIST IN TWO WEEKS, PLEASE GIVE [place] A CALL AT [phone].   THE PATIENT IS ENCOURAGED TO PRACTICE SOCIAL DISTANCING DUE TO THE COVID-19  PANDEMIC.

## 2021-08-10 ENCOUNTER — Ambulatory Visit
Admission: RE | Admit: 2021-08-10 | Discharge: 2021-08-10 | Disposition: A | Discharge status: Home / Self Care | Payer: Medicare PPO | Source: Ambulatory Visit | Attending: Internal Medicine | Admitting: Internal Medicine

## 2021-08-10 DIAGNOSIS — Z1231 Encounter for screening mammogram for malignant neoplasm of breast: Secondary | ICD-10-CM | POA: Diagnosis not present

## 2021-08-10 DIAGNOSIS — E1165 Type 2 diabetes mellitus with hyperglycemia: Secondary | ICD-10-CM | POA: Diagnosis not present

## 2021-08-10 DIAGNOSIS — I10 Essential (primary) hypertension: Secondary | ICD-10-CM | POA: Diagnosis not present

## 2021-08-11 LAB — CMP14+EGFR
ALT: 9 IU/L (ref 0–32)
AST: 18 IU/L (ref 0–40)
Albumin/Globulin Ratio: 1.5 (ref 1.2–2.2)
Albumin: 4.1 g/dL (ref 3.8–4.8)
Alkaline Phosphatase: 95 IU/L (ref 44–121)
BUN/Creatinine Ratio: 19 (ref 12–28)
BUN: 14 mg/dL (ref 8–27)
Bilirubin Total: 0.2 mg/dL (ref 0.0–1.2)
CO2: 23 mmol/L (ref 20–29)
Calcium: 9.3 mg/dL (ref 8.7–10.3)
Chloride: 105 mmol/L (ref 96–106)
Creatinine, Ser: 0.73 mg/dL (ref 0.57–1.00)
Globulin, Total: 2.8 g/dL (ref 1.5–4.5)
Glucose: 85 mg/dL (ref 70–99)
Potassium: 4 mmol/L (ref 3.5–5.2)
Sodium: 144 mmol/L (ref 134–144)
Total Protein: 6.9 g/dL (ref 6.0–8.5)
eGFR: 88 mL/min/{1.73_m2} (ref >59)

## 2021-08-11 LAB — HEMOGLOBIN A1C
Est. average glucose Bld gHb Est-mCnc: 123 mg/dL
Hgb A1c MFr Bld: 5.9 % — ABNORMAL HIGH (ref 4.8–5.6)

## 2021-08-11 NOTE — Telephone Encounter (Signed)
Application was refaxed with corrected information. Thank you

## 2021-08-15 ENCOUNTER — Encounter: Payer: Self-pay | Admitting: Internal Medicine

## 2021-08-30 ENCOUNTER — Ambulatory Visit: Payer: Medicare PPO | Admitting: Podiatry

## 2021-08-30 ENCOUNTER — Ambulatory Visit (INDEPENDENT_AMBULATORY_CARE_PROVIDER_SITE_OTHER): Payer: Medicare PPO

## 2021-08-30 ENCOUNTER — Encounter: Payer: Self-pay | Admitting: Podiatry

## 2021-08-30 ENCOUNTER — Other Ambulatory Visit: Payer: Self-pay

## 2021-08-30 DIAGNOSIS — M2022 Hallux rigidus, left foot: Secondary | ICD-10-CM | POA: Diagnosis not present

## 2021-08-30 DIAGNOSIS — M7752 Other enthesopathy of left foot: Secondary | ICD-10-CM

## 2021-08-30 DIAGNOSIS — M79672 Pain in left foot: Secondary | ICD-10-CM | POA: Diagnosis not present

## 2021-08-30 MED ORDER — TRIAMCINOLONE ACETONIDE 10 MG/ML IJ SUSP
10.0000 mg | Freq: Once | INTRAMUSCULAR | Status: AC
  Administered 2021-08-30: 10 mg

## 2021-08-30 NOTE — Progress Notes (Signed)
Subjective:   Patient ID: [name], female   DOB: 71 y.o.   MRN: [id number]   HPI Patient presents with a lot of pain around the big toe joint left and states that its been this way for a fairly long time but is worsened recently.  Does not remember specific injury and patient does not smoke and likes to be active if possible but this does affect her   Review of Systems  All other systems reviewed and are negative.      Objective:  Physical Exam Vitals and nursing note reviewed.  Constitutional:      Appearance: She is well-developed.  Pulmonary:     Effort: Pulmonary effort is normal.  Musculoskeletal:        General: Normal range of motion.  Skin:    General: Skin is warm.  Neurological:     Mental Status: She is alert.    Neurovascular status intact muscle strength adequate range of motion within normal limits with patient found to have discomfort in the left first MPJ with reduced range of motion spur formation and fluid buildup around the joint surface.  Patient is found to have good digital perfusion well oriented x3     Assessment:  Inflammatory capsulitis of the first MPJ left with fluid buildup around the joint surface     Plan:  H&P reviewed condition and x-rays and today I did sterile prep and injected around the first MPJ 3 mg Kenalog 5 mg Xylocaine discussed the structural abnormality and the considerations long-term for surgery with possible spur removal and shortening or plantar flexor the osteotomy.  Patient will be seen back and we can make a decision long-term depending on short-term response to medication  X-rays indicate dorsal spur and narrowing of the joint surface

## 2021-09-12 ENCOUNTER — Other Ambulatory Visit: Payer: Self-pay | Admitting: Podiatry

## 2021-09-12 DIAGNOSIS — M2022 Hallux rigidus, left foot: Secondary | ICD-10-CM

## 2021-09-21 DIAGNOSIS — G5602 Carpal tunnel syndrome, left upper limb: Secondary | ICD-10-CM | POA: Diagnosis not present

## 2021-09-27 ENCOUNTER — Ambulatory Visit: Payer: Medicare PPO | Admitting: Podiatry

## 2021-09-27 ENCOUNTER — Other Ambulatory Visit: Payer: Self-pay

## 2021-09-27 ENCOUNTER — Encounter: Payer: Self-pay | Admitting: Podiatry

## 2021-09-27 DIAGNOSIS — M2022 Hallux rigidus, left foot: Secondary | ICD-10-CM | POA: Diagnosis not present

## 2021-09-27 DIAGNOSIS — M7752 Other enthesopathy of left foot: Secondary | ICD-10-CM | POA: Diagnosis not present

## 2021-09-28 NOTE — Progress Notes (Signed)
Subjective:  ? ?Patient ID: [name], female   DOB: 71 y.o.   MRN: [id number]  ? ?HPI ?Patient presents stating that the joint feels a lot better with minimal discomfort and she is walking with a better gait pattern ? ? ?ROS ? ? ?   ?Objective:  ?Physical Exam  ?Neurovascular status intact muscle strength was found to be adequate patient's left first MPJ is improved there is minimal discomfort in the joint there still is range of motion loss within the joint secondary to structural changes and the second toe on top of it ? ?   ?Assessment:  ?Doing better from acute inflammation of the first MPJ left foot with sterile structural changes ? ?   ?Plan:  ?Reviewed condition recommended the continuation of conservative care consisting of rigid bottom shoes and stretching exercises and hopefully we will be able to avoid surgery on this even though it may be necessary at 1 point in future ?   ? ? ?

## 2021-10-05 DIAGNOSIS — M79642 Pain in left hand: Secondary | ICD-10-CM | POA: Diagnosis not present

## 2021-10-13 DIAGNOSIS — M79642 Pain in left hand: Secondary | ICD-10-CM | POA: Diagnosis not present

## 2021-10-19 ENCOUNTER — Encounter: Payer: Self-pay | Admitting: Internal Medicine

## 2021-10-19 ENCOUNTER — Telehealth: Payer: Self-pay

## 2021-10-19 DIAGNOSIS — M79642 Pain in left hand: Secondary | ICD-10-CM | POA: Diagnosis not present

## 2021-10-19 NOTE — Telephone Encounter (Addendum)
-  note created in error

## 2021-10-19 NOTE — Chronic Care Management (AMB) (Signed)
? ? ?  Chronic Care Management ?Pharmacy Assistant  ? ?Name: ANITHA KREISER  MRN: [id number] DOB: 1951/02/23 ? ?Reason for Encounter: Patient assistance follow up  ? ? ?Medications: ?Outpatient Encounter Medications as of 10/19/2021  ?Medication Sig  ? Alcohol Swabs (DROPSAFE ALCOHOL PREP) 70 % PADS USE AS DIRECTED WHEN CHECKING BLOOD SUGARS  ? Blood Glucose Calibration (TRUE METRIX LEVEL 1) Low SOLN Use as directed to check blood sugars 1 time per dx: e11.22  ? Blood Glucose Monitoring Suppl (TRUE METRIX AIR GLUCOSE METER) w/Device KIT Inject 1 kit into the skin daily. Use as directed to check blood sugars 1 time per dx: e11.22  ? Cholecalciferol (VITAMIN D) 2000 UNITS tablet Take 2,000 Units by mouth daily.   ? cyclobenzaprine (FLEXERIL) 5 MG tablet One tab po qhs prn  ? diclofenac Sodium (VOLTAREN) 1 % GEL Place onto the skin.  ? latanoprost (XALATAN) 0.005 % ophthalmic solution INSTILL 1 DROP INTO OU HS  ? levocetirizine (XYZAL) 5 MG tablet Take 5 mg by mouth daily as needed for allergies.  ? metFORMIN (GLUCOPHAGE-XR) 750 MG 24 hr tablet TAKE 1 TABLET EVERY DAY WITH EVENING MEAL  ? Multiple Vitamin (MULTIVITAMIN WITH MINERALS) TABS tablet Take 1 tablet by mouth daily.  ? Olmesartan-amLODIPine-HCTZ 40-5-12.5 MG TABS TAKE 1 TABLET EVERY DAY  ? Semaglutide,0.25 or 0.5MG/DOS, (OZEMPIC, 0.25 OR 0.5 MG/DOSE,) 2 MG/1.5ML SOPN Inject 0.5 mg into the skin once a week.  ? Triamcinolone Acetonide (NASACORT AQ NA) Place into the nose.  ? TRUE METRIX BLOOD GLUCOSE TEST test strip USE AS DIRECTED TO CHECK BLOOD SUGARS  ? TRUEplus Lancets 33G MISC USE AS DIRECTED TO CHECK BLOOD SUGARS  ? ?No facility-administered encounter medications on file as of 10/19/2021.  ? ?10-19-2021: Contacted [facility] and was informed by the automated system that patient's ozempic is in the process of being shipped. Tracking information will be available in 1-2 days.  ? ? ?Malecca Hicks CMA ?Clinical Pharmacist Assistant ?[phone] ? ?

## 2021-12-07 ENCOUNTER — Encounter: Payer: Self-pay | Admitting: Internal Medicine

## 2021-12-07 ENCOUNTER — Ambulatory Visit: Payer: Medicare PPO | Admitting: Internal Medicine

## 2021-12-07 VITALS — BP 132/86 | HR 77 | Temp 98.7°F | Ht 65.2 in | Wt 245.0 lb

## 2021-12-07 DIAGNOSIS — I10 Essential (primary) hypertension: Secondary | ICD-10-CM | POA: Diagnosis not present

## 2021-12-07 DIAGNOSIS — R011 Cardiac murmur, unspecified: Secondary | ICD-10-CM

## 2021-12-07 DIAGNOSIS — Z6841 Body Mass Index (BMI) 40.0 and over, adult: Secondary | ICD-10-CM

## 2021-12-07 DIAGNOSIS — Z2821 Immunization not carried out because of patient refusal: Secondary | ICD-10-CM | POA: Diagnosis not present

## 2021-12-07 DIAGNOSIS — E785 Hyperlipidemia, unspecified: Secondary | ICD-10-CM

## 2021-12-07 DIAGNOSIS — E1169 Type 2 diabetes mellitus with other specified complication: Secondary | ICD-10-CM

## 2021-12-07 DIAGNOSIS — E66812 Obesity, class 2: Secondary | ICD-10-CM | POA: Insufficient documentation

## 2021-12-07 DIAGNOSIS — E7800 Pure hypercholesterolemia, unspecified: Secondary | ICD-10-CM | POA: Insufficient documentation

## 2021-12-07 MED ORDER — OZEMPIC (0.25 OR 0.5 MG/DOSE) 2 MG/1.5ML ~~LOC~~ SOPN: 0.5000 mg | PEN_INJECTOR | SUBCUTANEOUS | 1 refills | Status: DC

## 2021-12-07 NOTE — Patient Instructions (Addendum)
The 10-year ASCVD risk score (Arnett DK, et al., 2019) is: 27.3%   Values used to calculate the score:     Age: 71 years     Sex: Female     Is Non-Hispanic African American: Yes     Diabetic: Yes     Tobacco smoker: No     Systolic Blood Pressure: 062 mmHg     Is BP treated: Yes     HDL Cholesterol: 53 mg/dL     Total Cholesterol: 187 mg/dL   Diabetes Mellitus and Nutrition, Adult When you have diabetes, or diabetes mellitus, it is very important to have healthy eating habits because your blood sugar (glucose) levels are greatly affected by what you eat and drink. Eating healthy foods in the right amounts, at about the same times every day, can help you: Manage your blood glucose. Lower your risk of heart disease. Improve your blood pressure. Reach or maintain a healthy weight. What can affect my meal plan? Every person with diabetes is different, and each person has different needs for a meal plan. Your health care provider may recommend that you work with a dietitian to make a meal plan that is best for you. Your meal plan may vary depending on factors such as: The calories you need. The medicines you take. Your weight. Your blood glucose, blood pressure, and cholesterol levels. Your activity level. Other health conditions you have, such as heart or kidney disease. How do carbohydrates affect me? Carbohydrates, also called carbs, affect your blood glucose level more than any other type of food. Eating carbs raises the amount of glucose in your blood. It is important to know how many carbs you can safely have in each meal. This is different for every person. Your dietitian can help you calculate how many carbs you should have at each meal and for each snack. How does alcohol affect me? Alcohol can cause a decrease in blood glucose (hypoglycemia), especially if you use insulin or take certain diabetes medicines by mouth. Hypoglycemia can be a life-threatening condition. Symptoms of  hypoglycemia, such as sleepiness, dizziness, and confusion, are similar to symptoms of having too much alcohol. Do not drink alcohol if: Your health care provider tells you not to drink. You are pregnant, may be pregnant, or are planning to become pregnant. If you drink alcohol: Limit how much you have to: 0-1 drink a day for women. 0-2 drinks a day for men. Know how much alcohol is in your drink. In the [place] one drink equals one 12 oz bottle of beer (355 mL), one 5 oz glass of wine (148 mL), or one 1 oz glass of hard liquor (44 mL). Keep yourself hydrated with water, diet soda, or unsweetened iced tea. Keep in mind that regular soda, juice, and other mixers may contain a lot of sugar and must be counted as carbs. What are tips for following this plan?  Reading food labels Start by checking the serving size on the Nutrition Facts label of packaged foods and drinks. The number of calories and the amount of carbs, fats, and other nutrients listed on the label are based on one serving of the item. Many items contain more than one serving per package. Check the total grams (g) of carbs in one serving. Check the number of grams of saturated fats and trans fats in one serving. Choose foods that have a low amount or none of these fats. Check the number of milligrams (mg) of salt (sodium) in one  serving. Most people should limit total sodium intake to less than 2,300 mg per day. Always check the nutrition information of foods labeled as "low-fat" or "nonfat." These foods may be higher in added sugar or refined carbs and should be avoided. Talk to your dietitian to identify your daily goals for nutrients listed on the label. Shopping Avoid buying canned, pre-made, or processed foods. These foods tend to be high in fat, sodium, and added sugar. Shop around the outside edge of the grocery store. This is where you will most often find fresh fruits and vegetables, bulk grains, fresh meats, and fresh dairy  products. Cooking Use low-heat cooking methods, such as baking, instead of high-heat cooking methods, such as deep frying. Cook using healthy oils, such as olive, canola, or sunflower oil. Avoid cooking with butter, cream, or high-fat meats. Meal planning Eat meals and snacks regularly, preferably at the same times every day. Avoid going long periods of time without eating. Eat foods that are high in fiber, such as fresh fruits, vegetables, beans, and whole grains. Eat 4-6 oz (112-168 g) of lean protein each day, such as lean meat, chicken, fish, eggs, or tofu. One ounce (oz) (28 g) of lean protein is equal to: 1 oz (28 g) of meat, chicken, or fish. 1 egg.  cup (62 g) of tofu. Eat some foods each day that contain healthy fats, such as avocado, nuts, seeds, and fish. What foods should I eat? Fruits Berries. Apples. Oranges. Peaches. Apricots. Plums. Grapes. Mangoes. Papayas. Pomegranates. Kiwi. Cherries. Vegetables Leafy greens, including lettuce, spinach, kale, chard, collard greens, mustard greens, and cabbage. Beets. Cauliflower. Broccoli. Carrots. Green beans. Tomatoes. Peppers. Onions. Cucumbers. Brussels sprouts. Grains Whole grains, such as whole-wheat or whole-grain bread, crackers, tortillas, cereal, and pasta. Unsweetened oatmeal. Quinoa. Brown or wild rice. Meats and other proteins Seafood. Poultry without skin. Lean cuts of poultry and beef. Tofu. Nuts. Seeds. Dairy Low-fat or fat-free dairy products such as milk, yogurt, and cheese. The items listed above may not be a complete list of foods and beverages you can eat and drink. Contact a dietitian for more information. What foods should I avoid? Fruits Fruits canned with syrup. Vegetables Canned vegetables. Frozen vegetables with butter or cream sauce. Grains Refined white flour and flour products such as bread, pasta, snack foods, and cereals. Avoid all processed foods. Meats and other proteins Fatty cuts of meat.  Poultry with skin. Breaded or fried meats. Processed meat. Avoid saturated fats. Dairy Full-fat yogurt, cheese, or milk. Beverages Sweetened drinks, such as soda or iced tea. The items listed above may not be a complete list of foods and beverages you should avoid. Contact a dietitian for more information. Questions to ask a health care provider Do I need to meet with a certified diabetes care and education specialist? Do I need to meet with a dietitian? What number can I call if I have questions? When are the best times to check my blood glucose? Where to find more information: American Diabetes Association: diabetes.org Academy of Nutrition and Dietetics: eatright.[employer] of Diabetes and Digestive and Kidney Diseases: [employer]en[employer].is Association of Diabetes Care & Education Specialists: diabeteseducator.org Summary It is important to have healthy eating habits because your blood sugar (glucose) levels are greatly affected by what you eat and drink. It is important to use alcohol carefully. A healthy meal plan will help you manage your blood glucose and lower your risk of heart disease. Your health care provider may recommend that you work with  a dietitian to make a meal plan that is best for you. This information is not intended to replace advice given to you by your health care provider. Make sure you discuss any questions you have with your health care provider. Document Revised: 02/03/2020 Document Reviewed: 02/03/2020 Elsevier Patient Education  [place].

## 2021-12-07 NOTE — Progress Notes (Signed)
[name],acting as a Education administrator for [name], MD.,have documented all relevant documentation on the behalf of [name], MD,as directed by  [name], MD while in the presence of [name], MD.  This visit occurred during the SARS-CoV-2 public health emergency.  Safety protocols were in place, including screening questions prior to the visit, additional usage of staff PPE, and extensive cleaning of exam room while observing appropriate contact time as indicated for disinfecting solutions.  Subjective:     Patient ID: [name] , female    DOB: 04-Jan-1951 , 71 y.o.   MRN: [id number]   Chief Complaint  Patient presents with   Diabetes   Hypertension    HPI  She presents today for DM/HTN check. She reports compliance with meds. She denies headaches, chest pain and shortness of breath. She recently returned from [place], her grandson just graduated from high school.   Diabetes She presents for her follow-up diabetic visit. She has type 2 diabetes mellitus. Her disease course has been improving. There are no hypoglycemic associated symptoms. There are no diabetic associated symptoms. Pertinent negatives for diabetes include no blurred vision, no chest pain, no polydipsia, no polyphagia and no polyuria. There are no hypoglycemic complications. There are no diabetic complications. Risk factors for coronary artery disease include diabetes mellitus, hypertension, obesity and post-menopausal. She is compliant with treatment most of the time. She is following a generally healthy diet. She participates in exercise intermittently. Her breakfast blood glucose is taken between 8-9 am. Her breakfast blood glucose range is generally 70-90 mg/dl. Eye exam is current.  Hypertension This is a chronic problem. The current episode started more than 1 year ago. The problem is unchanged. The problem is controlled. Pertinent negatives include no blurred vision, chest pain, orthopnea,  palpitations, PND or shortness of breath. The current treatment provides moderate improvement.    Past Medical History:  Diagnosis Date   Diabetes mellitus without complication (HCC)    GERD (gastroesophageal reflux disease)    Headache(784.0)    High cholesterol    Hypertension    Sleep apnea    mild- no CPAP   Stroke ([place]) 2005   mini- stroke due to HTN     Family History  Problem Relation Age of Onset   Cancer Mother    Hypertension Mother    Cancer Maternal Grandmother    Hypertension Maternal Grandmother      Current Outpatient Medications:    Alcohol Swabs (DROPSAFE ALCOHOL PREP) 70 % PADS, USE AS DIRECTED WHEN CHECKING BLOOD SUGARS, Disp: 300 each, Rfl: 3   Blood Glucose Calibration (TRUE METRIX LEVEL 1) Low SOLN, Use as directed to check blood sugars 1 time per dx: e11.22, Disp: 2 each, Rfl: 2   Blood Glucose Monitoring Suppl (TRUE METRIX AIR GLUCOSE METER) w/Device KIT, Inject 1 kit into the skin daily. Use as directed to check blood sugars 1 time per dx: e11.22, Disp: 1 kit, Rfl: 1   Cholecalciferol (VITAMIN D) 2000 UNITS tablet, Take 2,000 Units by mouth daily. , Disp: , Rfl:    cyclobenzaprine (FLEXERIL) 5 MG tablet, One tab po qhs prn, Disp: 30 tablet, Rfl: 0   latanoprost (XALATAN) 0.005 % ophthalmic solution, INSTILL 1 DROP INTO OU HS, Disp: , Rfl:    levocetirizine (XYZAL) 5 MG tablet, Take 5 mg by mouth daily as needed for allergies., Disp: , Rfl:    metFORMIN (GLUCOPHAGE-XR) 750 MG 24 hr tablet, TAKE 1 TABLET EVERY DAY  WITH EVENING MEAL, Disp: 90 tablet, Rfl: 3   Multiple Vitamin (MULTIVITAMIN WITH MINERALS) TABS tablet, Take 1 tablet by mouth daily., Disp: , Rfl:    Olmesartan-amLODIPine-HCTZ 40-5-12.5 MG TABS, TAKE 1 TABLET EVERY DAY, Disp: 90 tablet, Rfl: 3   Triamcinolone Acetonide (NASACORT AQ NA), Place into the nose., Disp: , Rfl:    TRUE METRIX BLOOD GLUCOSE TEST test strip, USE AS DIRECTED TO CHECK BLOOD SUGARS, Disp: 300 strip, Rfl: 3   TRUEplus  Lancets 33G MISC, USE AS DIRECTED TO CHECK BLOOD SUGARS, Disp: 300 each, Rfl: 3   diclofenac Sodium (VOLTAREN) 1 % GEL, Place onto the skin. (Patient not taking: Reported on 12/07/2021), Disp: , Rfl:    Semaglutide,0.25 or 0.5MG/DOS, (OZEMPIC, 0.25 OR 0.5 MG/DOSE,) 2 MG/1.5ML SOPN, Inject 0.5 mg into the skin once a week., Disp: 3 mL, Rfl: 1   Allergies  Allergen Reactions   Codeine Nausea And Vomiting and Diarrhea   Meperidine     Other reaction(s): Delusions (intolerance)     Review of Systems  Constitutional: Negative.   Eyes:  Negative for blurred vision.  Respiratory: Negative.  Negative for shortness of breath.   Cardiovascular: Negative.  Negative for chest pain, palpitations, orthopnea and PND.  Gastrointestinal: Negative.   Endocrine: Negative for polydipsia, polyphagia and polyuria.  Neurological: Negative.   Psychiatric/Behavioral: Negative.      Today's Vitals   12/07/21 1408  BP: 132/86  Pulse: 77  Temp: 98.7 F (37.1 C)  Weight: 245 lb (111.1 kg)  Height: 5' 5.2" (1.656 m)  PainSc: 0-No pain   Body mass index is 40.52 kg/m.  Wt Readings from Last 3 Encounters:  12/07/21 245 lb (111.1 kg)  08/09/21 238 lb 12.8 oz (108.3 kg)  03/30/21 245 lb 6.4 oz (111.3 kg)     Objective:  Physical Exam Vitals and nursing note reviewed.  Constitutional:      Appearance: Normal appearance. She is obese.  HENT:     Head: Normocephalic and atraumatic.  Eyes:     Extraocular Movements: Extraocular movements intact.  Cardiovascular:     Rate and Rhythm: Normal rate and regular rhythm.     Heart sounds: Murmur heard.  Pulmonary:     Effort: Pulmonary effort is normal.     Breath sounds: Normal breath sounds.  Musculoskeletal:     Cervical back: Normal range of motion.  Skin:    General: Skin is warm.  Neurological:     General: No focal deficit present.     Mental Status: She is alert.  Psychiatric:        Mood and Affect: Mood normal.        Behavior: Behavior  normal.     Assessment And Plan:     1. Dyslipidemia associated with type 2 diabetes mellitus ([place]) Comments: ASCVD score is 27%. She still does not wish to start statin therapy. We also discussed cardiac calcium score. I will check labs as below.  - BMP8+eGFR - Hemoglobin A1c  2. Essential hypertension, benign Comments: Chronic, fair control. Goal BP<130/80. Advised to follow low sodium diet.   3. Heart murmur Comments: She agrees to 2d echocardiogram for further evaluation.  - ECHOCARDIOGRAM COMPLETE; Future  4. Class 3 severe obesity due to excess calories with serious comorbidity and body mass index (BMI) of 40.0 to 44.9 in adult [facility]) Comments: BMI 40.  She was made aware of 7lb weight gain.  She is encouraged to aim for at least 150 minutes of exercise  per week.   5. Herpes zoster vaccination declined   Patient was given opportunity to ask questions. Patient verbalized understanding of the plan and was able to repeat key elements of the plan. All questions were answered to their satisfaction.   I, [name], MD, have reviewed all documentation for this visit. The documentation on 12/07/21 for the exam, diagnosis, procedures, and orders are all accurate and complete.   IF YOU HAVE BEEN REFERRED TO A SPECIALIST, IT MAY TAKE 1-2 WEEKS TO SCHEDULE/PROCESS THE REFERRAL. IF YOU HAVE NOT HEARD FROM US/SPECIALIST IN TWO WEEKS, PLEASE GIVE [place] A CALL AT [phone].   THE PATIENT IS ENCOURAGED TO PRACTICE SOCIAL DISTANCING DUE TO THE COVID-19 PANDEMIC.

## 2021-12-08 LAB — BMP8+EGFR
BUN/Creatinine Ratio: 24 (ref 12–28)
BUN: 17 mg/dL (ref 8–27)
CO2: 23 mmol/L (ref 20–29)
Calcium: 9.1 mg/dL (ref 8.7–10.3)
Chloride: 102 mmol/L (ref 96–106)
Creatinine, Ser: 0.7 mg/dL (ref 0.57–1.00)
Glucose: 93 mg/dL (ref 70–99)
Potassium: 3.6 mmol/L (ref 3.5–5.2)
Sodium: 139 mmol/L (ref 134–144)
eGFR: 92 mL/min/{1.73_m2} (ref >59)

## 2021-12-08 LAB — HEMOGLOBIN A1C
Est. average glucose Bld gHb Est-mCnc: 120 mg/dL
Hgb A1c MFr Bld: 5.8 % — ABNORMAL HIGH (ref 4.8–5.6)

## 2021-12-12 ENCOUNTER — Encounter: Payer: Self-pay | Admitting: Internal Medicine

## 2021-12-13 ENCOUNTER — Telehealth: Payer: Self-pay

## 2021-12-13 NOTE — Chronic Care Management (AMB) (Cosign Needed)
[employer] patient assistance program notification:  Patient enrolled in auto-fill, 120- day supply of Ozempic 0.25/.05 mg will be filled on 12/31/2021 and should arrive to the office in 10-14 business days. Patient enrollment will expire on 06/14/2022.  [name], [place] Pharmacist Assistant [phone]

## 2021-12-23 ENCOUNTER — Other Ambulatory Visit: Payer: Self-pay | Admitting: Internal Medicine

## 2021-12-24 ENCOUNTER — Other Ambulatory Visit: Payer: Self-pay | Admitting: Internal Medicine

## 2021-12-24 DIAGNOSIS — E1169 Type 2 diabetes mellitus with other specified complication: Secondary | ICD-10-CM

## 2021-12-25 ENCOUNTER — Encounter: Payer: Self-pay | Admitting: Internal Medicine

## 2021-12-27 ENCOUNTER — Other Ambulatory Visit: Payer: Self-pay | Admitting: Internal Medicine

## 2021-12-27 ENCOUNTER — Ambulatory Visit (HOSPITAL_COMMUNITY)
Admission: RE | Admit: 2021-12-27 | Discharge: 2021-12-27 | Disposition: A | Discharge status: Home / Self Care | Payer: Medicare PPO | Source: Ambulatory Visit | Attending: Internal Medicine | Admitting: Internal Medicine

## 2021-12-27 ENCOUNTER — Encounter: Payer: Self-pay | Admitting: Internal Medicine

## 2021-12-27 DIAGNOSIS — E785 Hyperlipidemia, unspecified: Secondary | ICD-10-CM | POA: Insufficient documentation

## 2021-12-27 DIAGNOSIS — R011 Cardiac murmur, unspecified: Secondary | ICD-10-CM | POA: Insufficient documentation

## 2021-12-27 DIAGNOSIS — G4730 Sleep apnea, unspecified: Secondary | ICD-10-CM | POA: Diagnosis not present

## 2021-12-27 DIAGNOSIS — I10 Essential (primary) hypertension: Secondary | ICD-10-CM | POA: Insufficient documentation

## 2021-12-27 LAB — ECHOCARDIOGRAM COMPLETE
Area-P 1/2: 3.53 cm2
S' Lateral: 2.7 cm

## 2021-12-27 NOTE — Progress Notes (Signed)
Echocardiogram 2D Echocardiogram has been performed.  Oneal Deputy Trase Bunda RDCS 12/27/2021, 2:53 PM  BP taken 3 times at 183/94, 197/98, and 183/87. In house cardiologist recommended taking an extra Olmesartan for today and contacting PCP to reassess for proper dosage.

## 2021-12-28 DIAGNOSIS — H43823 Vitreomacular adhesion, bilateral: Secondary | ICD-10-CM | POA: Diagnosis not present

## 2021-12-28 DIAGNOSIS — H25813 Combined forms of age-related cataract, bilateral: Secondary | ICD-10-CM | POA: Diagnosis not present

## 2021-12-28 DIAGNOSIS — E119 Type 2 diabetes mellitus without complications: Secondary | ICD-10-CM | POA: Diagnosis not present

## 2021-12-28 DIAGNOSIS — H40023 Open angle with borderline findings, high risk, bilateral: Secondary | ICD-10-CM | POA: Diagnosis not present

## 2021-12-28 LAB — HM DIABETES EYE EXAM

## 2021-12-31 NOTE — ED Notes (Signed)
Received call from [facility] looking for patient as she was listed as a missing person.

## 2022-01-02 ENCOUNTER — Ambulatory Visit: Payer: Medicare PPO

## 2022-01-02 VITALS — BP 132/70 | HR 71 | Temp 98.4°F | Ht 66.0 in | Wt 242.6 lb

## 2022-01-02 DIAGNOSIS — I10 Essential (primary) hypertension: Secondary | ICD-10-CM

## 2022-01-02 NOTE — Progress Notes (Signed)
Patient presents today for a nurse visit bp check. Today her bp was 132/70. shes taking olmesartan-amlodipine-hctz 40-5-12.'5mg'$ . she was advised to continue her current medications and given some instructions on diet changes to help improve her bp by Dr.Sanders. no med changes at this time. YL,RMA

## 2022-01-15 ENCOUNTER — Telehealth: Payer: Self-pay

## 2022-01-15 NOTE — Chronic Care Management (AMB) (Signed)
[employer]ovo Nordisk patient assistance program notification:  120- day supply of Ozempic 0.25/0.5 mg was filled on 01/02/2022 and should arrive to the office in 10-14 business days. Patient has 1  refill remaining and enrollment will expire on 06/14/2022.  The next refill for patient will be fulfilled on 03/21/2022.  [name], [place] Pharmacist Assistant [phone]

## 2022-02-10 ENCOUNTER — Encounter: Payer: Self-pay | Admitting: Internal Medicine

## 2022-02-13 ENCOUNTER — Telehealth: Payer: Self-pay

## 2022-02-13 NOTE — Chronic Care Management (AMB) (Signed)
    Chronic Care Management Pharmacy Assistant   Name: [name]  MRN: [id number] DOB: 24-Oct-1950  Reason for Encounter:  Patient assistance  02-13-2022: Contacted [facility] to follow up on [place]. Was informed that ozemoic was delivered and returned on 01-24-2022 due to office being closed. July 12 was on a Wednesday so unsure to why the office was closed. Order was re processed today and will arrive 10-14 business days. Patient was issued a free 30 day voucher: BIN [id number] PCN [place] GP [id number] ID [id number]  Contacted [facility] with voucher information which was successful. Patient was informed.  Medications: Outpatient Encounter Medications as of 02/13/2022  Medication Sig   Alcohol Swabs (DROPSAFE ALCOHOL PREP) 70 % PADS USE AS DIRECTED WHEN CHECKING BLOOD SUGARS   Blood Glucose Calibration (TRUE METRIX LEVEL 1) Low SOLN Use as directed to check blood sugars 1 time per dx: e11.22   Blood Glucose Monitoring Suppl (TRUE METRIX AIR GLUCOSE METER) w/Device KIT Inject 1 kit into the skin daily. Use as directed to check blood sugars 1 time per dx: e11.22   Cholecalciferol (VITAMIN D) 2000 UNITS tablet Take 2,000 Units by mouth daily.    diclofenac Sodium (VOLTAREN) 1 % GEL Place onto the skin. (Patient not taking: Reported on 12/07/2021)   latanoprost (XALATAN) 0.005 % ophthalmic solution INSTILL 1 DROP INTO OU HS   levocetirizine (XYZAL) 5 MG tablet Take 5 mg by mouth daily as needed for allergies.   metFORMIN (GLUCOPHAGE-XR) 750 MG 24 hr tablet TAKE 1 TABLET EVERY DAY WITH EVENING MEAL   Multiple Vitamin (MULTIVITAMIN WITH MINERALS) TABS tablet Take 1 tablet by mouth daily.   Olmesartan-amLODIPine-HCTZ 40-5-12.5 MG TABS TAKE 1 TABLET EVERY DAY   Semaglutide,0.25 or 0.5MG/DOS, (OZEMPIC, 0.25 OR 0.5 MG/DOSE,) 2 MG/1.5ML SOPN Inject 0.5 mg into the skin once a week.   Triamcinolone Acetonide (NASACORT AQ NA) Place into the nose.   TRUE METRIX BLOOD GLUCOSE TEST test strip USE AS DIRECTED TO  CHECK BLOOD SUGARS   TRUEplus Lancets 33G MISC USE AS DIRECTED TO CHECK BLOOD SUGARS   No facility-administered encounter medications on file as of 02/13/2022.     [place] Pharmacist Assistant [phone]

## 2022-02-15 ENCOUNTER — Ambulatory Visit: Payer: Medicare PPO | Admitting: Internal Medicine

## 2022-02-15 ENCOUNTER — Ambulatory Visit (INDEPENDENT_AMBULATORY_CARE_PROVIDER_SITE_OTHER): Payer: Medicare PPO

## 2022-02-15 VITALS — BP 128/70 | HR 90 | Temp 98.1°F | Ht 67.0 in | Wt 243.8 lb

## 2022-02-15 DIAGNOSIS — Z0000 Encounter for general adult medical examination without abnormal findings: Secondary | ICD-10-CM

## 2022-02-15 NOTE — Patient Instructions (Addendum)
[name] , Thank you for taking time to come for your Medicare Wellness Visit. I appreciate your ongoing commitment to your health goals. Please review the following plan we discussed and let me know if I can assist you in the future.   Screening recommendations/referrals: Colonoscopy: completed 10/22/2012, due 10/23/2022 Mammogram: completed 08/10/2021, due 08/11/2022 Bone Density: completed 05/28/2016 Recommended yearly ophthalmology/optometry visit for glaucoma screening and checkup Recommended yearly dental visit for hygiene and checkup  Vaccinations: Influenza vaccine: due Pneumococcal vaccine: completed 09/04/2019 Tdap vaccine: completed 12/26/2017, due 12/27/2027 Shingles vaccine: discussed   Covid-19: 10/16/2019, 09/25/2019  Advanced directives: ````````````````````````````````````````````````````````````````````Advance directive discussed with you today. Even though you declined this today please call our office should you change your mind and we can give you the proper paperwork for you to fill out.  Conditions/risks identified: none  Next appointment: Follow up in one year for your annual wellness visit    Preventive Care 65 Years and Older, Female Preventive care refers to lifestyle choices and visits with your health care provider that can promote health and wellness. What does preventive care include? A yearly physical exam. This is also called an annual well check. Dental exams once or twice a year. Routine eye exams. Ask your health care provider how often you should have your eyes checked. Personal lifestyle choices, including: Daily care of your teeth and gums. Regular physical activity. Eating a healthy diet. Avoiding tobacco and drug use. Limiting alcohol use. Practicing safe sex. Taking low-dose aspirin every day. Taking vitamin and mineral supplements as recommended by your health care provider. What happens during an annual well check? The services and screenings  done by your health care provider during your annual well check will depend on your age, overall health, lifestyle risk factors, and family history of disease. Counseling  Your health care provider may ask you questions about your: Alcohol use. Tobacco use. Drug use. Emotional well-being. Home and relationship well-being. Sexual activity. Eating habits. History of falls. Memory and ability to understand (cognition). Work and work Statistician. Reproductive health. Screening  You may have the following tests or measurements: Height, weight, and BMI. Blood pressure. Lipid and cholesterol levels. These may be checked every 5 years, or more frequently if you are over 46 years old. Skin check. Lung cancer screening. You may have this screening every year starting at age 6 if you have a 30-pack-year history of smoking and currently smoke or have quit within the past 15 years. Fecal occult blood test (FOBT) of the stool. You may have this test every year starting at age 50. Flexible sigmoidoscopy or colonoscopy. You may have a sigmoidoscopy every 5 years or a colonoscopy every 10 years starting at age 40. Hepatitis C blood test. Hepatitis B blood test. Sexually transmitted disease (STD) testing. Diabetes screening. This is done by checking your blood sugar (glucose) after you have not eaten for a while (fasting). You may have this done every 1-3 years. Bone density scan. This is done to screen for osteoporosis. You may have this done starting at age 61. Mammogram. This may be done every 1-2 years. Talk to your health care provider about how often you should have regular mammograms. Talk with your health care provider about your test results, treatment options, and if necessary, the need for more tests. Vaccines  Your health care provider may recommend certain vaccines, such as: Influenza vaccine. This is recommended every year. Tetanus, diphtheria, and acellular pertussis (Tdap, Td)  vaccine. You may need a Td booster  every 10 years. Zoster vaccine. You may need this after age 71. Pneumococcal 13-valent conjugate (PCV13) vaccine. One dose is recommended after age 62. Pneumococcal polysaccharide (PPSV23) vaccine. One dose is recommended after age 31. Talk to your health care provider about which screenings and vaccines you need and how often you need them. This information is not intended to replace advice given to you by your health care provider. Make sure you discuss any questions you have with your health care provider. Document Released: 07/29/2015 Document Revised: 03/21/2016 Document Reviewed: 05/03/2015 Elsevier Interactive Patient Education  2017 [place] Prevention in the Home Falls can cause injuries. They can happen to people of all ages. There are many things you can do to make your home safe and to help prevent falls. What can I do on the outside of my home? Regularly fix the edges of walkways and driveways and fix any cracks. Remove anything that might make you trip as you walk through a door, such as a raised step or threshold. Trim any bushes or trees on the path to your home. Use bright outdoor lighting. Clear any walking paths of anything that might make someone trip, such as rocks or tools. Regularly check to see if handrails are loose or broken. Make sure that both sides of any steps have handrails. Any raised decks and porches should have guardrails on the edges. Have any leaves, snow, or ice cleared regularly. Use sand or salt on walking paths during winter. Clean up any spills in your garage right away. This includes oil or grease spills. What can I do in the bathroom? Use night lights. Install grab bars by the toilet and in the tub and shower. Do not use towel bars as grab bars. Use non-skid mats or decals in the tub or shower. If you need to sit down in the shower, use a plastic, non-slip stool. Keep the floor dry. Clean up any  water that spills on the floor as soon as it happens. Remove soap buildup in the tub or shower regularly. Attach bath mats securely with double-sided non-slip rug tape. Do not have throw rugs and other things on the floor that can make you trip. What can I do in the bedroom? Use night lights. Make sure that you have a light by your bed that is easy to reach. Do not use any sheets or blankets that are too big for your bed. They should not hang down onto the floor. Have a firm chair that has side arms. You can use this for support while you get dressed. Do not have throw rugs and other things on the floor that can make you trip. What can I do in the kitchen? Clean up any spills right away. Avoid walking on wet floors. Keep items that you use a lot in easy-to-reach places. If you need to reach something above you, use a strong step stool that has a grab bar. Keep electrical cords out of the way. Do not use floor polish or wax that makes floors slippery. If you must use wax, use non-skid floor wax. Do not have throw rugs and other things on the floor that can make you trip. What can I do with my stairs? Do not leave any items on the stairs. Make sure that there are handrails on both sides of the stairs and use them. Fix handrails that are broken or loose. Make sure that handrails are as long as the stairways. Check any carpeting to make  sure that it is firmly attached to the stairs. Fix any carpet that is loose or worn. Avoid having throw rugs at the top or bottom of the stairs. If you do have throw rugs, attach them to the floor with carpet tape. Make sure that you have a light switch at the top of the stairs and the bottom of the stairs. If you do not have them, ask someone to add them for you. What else can I do to help prevent falls? Wear shoes that: Do not have high heels. Have rubber bottoms. Are comfortable and fit you well. Are closed at the toe. Do not wear sandals. If you use a  stepladder: Make sure that it is fully opened. Do not climb a closed stepladder. Make sure that both sides of the stepladder are locked into place. Ask someone to hold it for you, if possible. Clearly mark and make sure that you can see: Any grab bars or handrails. First and last steps. Where the edge of each step is. Use tools that help you move around (mobility aids) if they are needed. These include: Canes. Walkers. Scooters. Crutches. Turn on the lights when you go into a dark area. Replace any light bulbs as soon as they burn out. Set up your furniture so you have a clear path. Avoid moving your furniture around. If any of your floors are uneven, fix them. If there are any pets around you, be aware of where they are. Review your medicines with your doctor. Some medicines can make you feel dizzy. This can increase your chance of falling. Ask your doctor what other things that you can do to help prevent falls. This information is not intended to replace advice given to you by your health care provider. Make sure you discuss any questions you have with your health care provider. Document Released: 04/28/2009 Document Revised: 12/08/2015 Document Reviewed: 08/06/2014 Elsevier Interactive Patient Education  2017 [employer].

## 2022-02-15 NOTE — Progress Notes (Signed)
Subjective:   TATISHA CERINO is a 71 y.o. female who presents for Medicare Annual (Subsequent) preventive examination.  Review of Systems     Cardiac Risk Factors include: advanced age (>52mn, >>86women);diabetes mellitus;dyslipidemia;hypertension;obesity (BMI >30kg/m2)     Objective:    Today's Vitals   02/15/22 1403 02/15/22 1421  BP: (!) 142/80 128/70  Pulse: 90   Temp: 98.1 F (36.7 C)   TempSrc: Oral   SpO2: 96%   Weight: 243 lb 12.8 oz (110.6 kg)   Height: _0  (1.702 m)    Body mass index is 38.18 kg/m.     02/15/2022    2:12 PM 01/19/2021    2:32 PM 12/30/2019    2:54 PM 01/06/2019   10:59 AM 04/23/2018    3:35 PM 02/02/2013   12:34 PM 01/30/2013    9:00 AM  Advanced Directives  Does Patient Have a Medical Advance Directive? _1  Patient does not have advance directive Patient does not have advance directive  Would patient like information on creating a medical advance directive? No - Patient declined No - Patient declined Yes (MAU/Ambulatory/Procedural Areas - Information given) No - Patient declined Yes (MAU/Ambulatory/Procedural Areas - Information given)      Current Medications (verified) Outpatient Encounter Medications as of 02/15/2022  Medication Sig   Alcohol Swabs (DROPSAFE ALCOHOL PREP) 70 % PADS USE AS DIRECTED WHEN CHECKING BLOOD SUGARS   Blood Glucose Calibration (TRUE METRIX LEVEL 1) Low SOLN Use as directed to check blood sugars 1 time per dx: e11.22   Blood Glucose Monitoring Suppl (TRUE METRIX AIR GLUCOSE METER) w/Device KIT Inject 1 kit into the skin daily. Use as directed to check blood sugars 1 time per dx: e11.22   Cholecalciferol (VITAMIN D) 2000 UNITS tablet Take 2,000 Units by mouth daily.    latanoprost (XALATAN) 0.005 % ophthalmic solution INSTILL 1 DROP INTO OU HS   levocetirizine (XYZAL) 5 MG tablet Take 5 mg by mouth daily as needed for allergies.   metFORMIN (GLUCOPHAGE-XR) 750 MG 24 hr tablet TAKE 1 TABLET EVERY DAY WITH  EVENING MEAL   Multiple Vitamin (MULTIVITAMIN WITH MINERALS) TABS tablet Take 1 tablet by mouth daily.   Olmesartan-amLODIPine-HCTZ 40-5-12.5 MG TABS TAKE 1 TABLET EVERY DAY   Semaglutide,0.25 or 0.5MG/DOS, (OZEMPIC, 0.25 OR 0.5 MG/DOSE,) 2 MG/1.5ML SOPN Inject 0.5 mg into the skin once a week.   Triamcinolone Acetonide (NASACORT AQ NA) Place into the nose.   TRUE METRIX BLOOD GLUCOSE TEST test strip USE AS DIRECTED TO CHECK BLOOD SUGARS   TRUEplus Lancets 33G MISC USE AS DIRECTED TO CHECK BLOOD SUGARS   diclofenac Sodium (VOLTAREN) 1 % GEL Place onto the skin. (Patient not taking: Reported on 12/07/2021)   No facility-administered encounter medications on file as of 02/15/2022.    Allergies (verified) Codeine and Meperidine   History: Past Medical History:  Diagnosis Date   Diabetes mellitus without complication (HCC)    GERD (gastroesophageal reflux disease)    Headache(784.0)    High cholesterol    Hypertension    Sleep apnea    mild- no CPAP   [place]   mini- stroke due to HTN   Past Surgical History:  Procedure Laterality Date   CHEST TUBE INSERTION  2004   lung punctured during shoulder surgery   FOOT SURGERY     bilateral foot surgery   HYSTEROSCOPY WITH D & C N/A 02/02/2013   Procedure: DILATATION AND CURETTAGE /HYSTEROSCOPY;  Surgeon:  Thurnell Lose, MD;  Location: [facility];  Service: Gynecology;  Laterality: N/A;   KNEE ARTHROSCOPY     SHOULDER ARTHROSCOPY     TUBAL LIGATION     Family History  Problem Relation Age of Onset   Cancer Mother    Hypertension Mother    Cancer Maternal Grandmother    Hypertension Maternal Grandmother    Social History   Socioeconomic History   Marital status: Single    Spouse name: Not on file   Number of children: Not on file   Years of education: Not on file   Highest education level: Not on file  Occupational History   Occupation: retired  Tobacco Use   Smoking status: Never   Smokeless tobacco: Never  Vaping  Use   Vaping Use: Never used  Substance and Sexual Activity   Alcohol use: Yes    Comment: occassionally   Drug use: No   Sexual activity: Not Currently  Other Topics Concern   Not on file  Social History Narrative   Not on file   Social Determinants of Health   Financial Resource Strain: Low Risk  (02/15/2022)   Overall Financial Resource Strain (CARDIA)    Difficulty of Paying Living Expenses: Not hard at all  Food Insecurity: No Food Insecurity (02/15/2022)   Hunger Vital Sign    Worried About Running Out of Food in the Last Year: Never true    [place] in the Last Year: Never true  Transportation Needs: No Transportation Needs (02/15/2022)   PRAPARE - Hydrologist (Medical): No    Lack of Transportation (Non-Medical): No  Physical Activity: Insufficiently Active (02/15/2022)   Exercise Vital Sign    Days of Exercise per Week: 4 days    Minutes of Exercise per Session: 30 min  Stress: No Stress Concern Present (02/15/2022)   [place]    Feeling of Stress : Not at all  Social Connections: Not on file    Tobacco Counseling Counseling given: Not Answered   Clinical Intake:  Pre-visit preparation completed: Yes  Pain : No/denies pain     Nutritional Status: BMI > 30  Obese Nutritional Risks: None Diabetes: No  How often do you need to have someone help you when you read instructions, pamphlets, or other written materials from your doctor or pharmacy?: 1 - Never What is the last grade level you completed in school?: some college  Diabetic? Yes Nutrition Risk Assessment:  Has the patient had any N/V/D within the last 2 months?  No  Does the patient have any non-healing wounds?  No  Has the patient had any unintentional weight loss or weight gain?  No   Diabetes:  Is the patient diabetic?  Yes  If diabetic, was a CBG obtained today?  No  Did the patient bring in  their glucometer from home?  No  How often do you monitor your CBG's? daily.   Financial Strains and Diabetes Management:  Are you having any financial strains with the device, your supplies or your medication? No .  Does the patient want to be seen by Chronic Care Management for management of their diabetes?  No  Would the patient like to be referred to a Nutritionist or for Diabetic Management?  No   Diabetic Exams:  Diabetic Eye Exam: Completed 2023 Diabetic Foot Exam: Overdue, Pt has been advised about the importance in completing this exam. Pt  is scheduled for diabetic foot exam on next appointment.   Interpreter Needed?: No  Information entered by :: NAllen LPN   Activities of Daily Living    02/15/2022    2:12 PM  In your present state of health, do you have any difficulty performing the following activities:  Hearing? 0  Vision? 0  Difficulty concentrating or making decisions? 0  Walking or climbing stairs? 0  Dressing or bathing? 0  Doing errands, shopping? 0  Preparing Food and eating ? N  Using the Toilet? N  In the past six months, have you accidently leaked urine? N  Do you have problems with loss of bowel control? N  Managing your Medications? N  Managing your Finances? N  Housekeeping or managing your Housekeeping? N    Patient Care Team: Glendale Chard, MD as PCP - General (Internal Medicine) Warden Fillers, MD as Consulting Physician (Ophthalmology)  Indicate any recent Medical Services you may have received from other than [facility] providers in the past year (date may be approximate).     Assessment:   This is a routine wellness examination for Ludwika.  Hearing/Vision screen Vision Screening - Comments:: Regular eye exams, Dr. Katy Fitch  Dietary issues and exercise activities discussed: Current Exercise Habits: Home exercise routine, Type of exercise: calisthenics;strength training/weights, Time (Minutes): 30, Frequency (Times/Week): 4, Weekly Exercise  (Minutes/Week): 120   Goals Addressed             This Visit's Progress    Patient Stated       02/15/2022, wants to lose weight       Depression Screen    02/15/2022    2:12 PM 01/19/2021    2:33 PM 12/30/2019    2:55 PM 01/06/2019   11:00 AM 10/14/2018    2:05 PM 07/02/2018    3:42 PM 06/02/2018    2:09 PM  PHQ 2/9 Scores  PHQ - 2 Score 0 0 0 0 0 0 0  PHQ- 9 Score   0 0       Fall Risk    02/15/2022    2:12 PM 01/19/2021    2:33 PM 12/30/2019    2:55 PM 02/17/2019    2:29 PM 01/06/2019   11:00 AM  Fall Risk   Falls in the past year? 0 0 0 0 0  Number falls in past yr: 0      Injury with Fall? 0      Risk for fall due to : Medication side effect Medication side effect Medication side effect  Medication side effect  Follow up Falls evaluation completed;Education provided;Falls prevention discussed Falls evaluation completed;Education provided;Falls prevention discussed Falls evaluation completed;Education provided;Falls prevention discussed  Education provided;Falls prevention discussed    FALL RISK PREVENTION PERTAINING TO THE HOME:  Any stairs in or around the home? Yes  If so, are there any without handrails? No  Home free of loose throw rugs in walkways, pet beds, electrical cords, etc? Yes  Adequate lighting in your home to reduce risk of falls? Yes   ASSISTIVE DEVICES UTILIZED TO PREVENT FALLS:  Life alert? No  Use of a cane, walker or w/c? No  Grab bars in the bathroom? Yes  Shower chair or bench in shower? No  Elevated toilet seat or a handicapped toilet? Yes   TIMED UP AND GO:  Was the test performed? No .    Gait steady and fast without use of assistive device  Cognitive Function:  02/15/2022    2:13 PM 01/19/2021    2:34 PM 12/30/2019    2:57 PM 01/06/2019   11:02 AM 04/23/2018    3:40 PM  6CIT Screen  What Year? 0 points 0 points 0 points 0 points 0 points  What month? 0 points 0 points 0 points 0 points 0 points  What time? 0 points 0 points  0 points 0 points 0 points  Count back from 20 0 points 0 points 0 points 0 points 0 points  Months in reverse 0 points 0 points 0 points 0 points 0 points  Repeat phrase 0 points 0 points 0 points 0 points 0 points  Total Score 0 points 0 points 0 points 0 points 0 points    Immunizations Immunization History  Administered Date(s) Administered   Fluad Quad(high Dose 65+) 05/05/2020, 03/30/2021   Influenza, High Dose Seasonal PF 04/23/2018, 06/29/2019, 08/12/2019   PFIZER(Purple Top)SARS-COV-2 Vaccination 09/25/2019, 10/16/2019   Pneumococcal Conjugate-13 09/04/2019   Pneumococcal Polysaccharide-23 04/23/2018   Tdap 12/26/2017    TDAP status: Up to date  Flu Vaccine status: Due, Education has been provided regarding the importance of this vaccine. Advised may receive this vaccine at local pharmacy or Health Dept. Aware to provide a copy of the vaccination record if obtained from local pharmacy or Health Dept. Verbalized acceptance and understanding.  Pneumococcal vaccine status: Up to date  Covid-19 vaccine status: Completed vaccines  Qualifies for Shingles Vaccine? Yes   Zostavax completed No   Shingrix Completed?: No.    Education has been provided regarding the importance of this vaccine. Patient has been advised to call insurance company to determine out of pocket expense if they have not yet received this vaccine. Advised may also receive vaccine at local pharmacy or Health Dept. Verbalized acceptance and understanding.  Screening Tests Health Maintenance  Topic Date Due   FOOT EXAM  06/03/2019   COVID-19 Vaccine (3 - Pfizer series) 12/11/2019   OPHTHALMOLOGY EXAM  12/20/2021   INFLUENZA VACCINE  02/13/2022   Zoster Vaccines- Shingrix (1 of 2) 03/09/2022 (Originally 11/28/2000)   HEMOGLOBIN A1C  06/09/2022   MAMMOGRAM  08/10/2022   COLONOSCOPY (Pts 45-66yrs Insurance coverage will need to be confirmed)  10/23/2022   TETANUS/TDAP  12/27/2027   Pneumonia Vaccine 65+ Years  old  Completed   DEXA SCAN  Completed   Hepatitis C Screening  Completed   HPV VACCINES  Aged Out    Health Maintenance  Health Maintenance Due  Topic Date Due   FOOT EXAM  06/03/2019   COVID-19 Vaccine (3 - Pfizer series) 12/11/2019   OPHTHALMOLOGY EXAM  12/20/2021   INFLUENZA VACCINE  02/13/2022    Colorectal cancer screening: Type of screening: Colonoscopy. Completed 10/22/2012. Repeat every 10 years  Mammogram status: Completed 08/10/2021. Repeat every year  Bone Density status: Completed 05/28/2016.   Lung Cancer Screening: (Low Dose CT Chest recommended if Age 95-80 years, 30 pack-year currently smoking OR have quit w/in 15years.) does not qualify.   Lung Cancer Screening Referral: no  Additional Screening:  Hepatitis C Screening: does qualify; Completed 09/09/2012  Vision Screening: Recommended annual ophthalmology exams for early detection of glaucoma and other disorders of the eye. Is the patient up to date with their annual eye exam?  Yes  Who is the provider or what is the name of the office in which the patient attends annual eye exams? .Dr. Katy Fitch If pt is not established with a provider, would they like to be referred to  a provider to establish care? No .   Dental Screening: Recommended annual dental exams for proper oral hygiene  Community Resource Referral / Chronic Care Management: CRR required this visit?  No   CCM required this visit?  No      Plan:     I have personally reviewed and noted the following in the patient's chart:   Medical and social history Use of alcohol, tobacco or illicit drugs  Current medications and supplements including opioid prescriptions.  Functional ability and status Nutritional status Physical activity Advanced directives List of other physicians Hospitalizations, surgeries, and ER visits in previous 12 months Vitals Screenings to include cognitive, depression, and falls Referrals and appointments  In addition, I  have reviewed and discussed with patient certain preventive protocols, quality metrics, and best practice recommendations. A written personalized care plan for preventive services as well as general preventive health recommendations were provided to patient.     Kellie Simmering, LPN   12/15/8636   Nurse Notes: none

## 2022-03-09 ENCOUNTER — Ambulatory Visit (HOSPITAL_BASED_OUTPATIENT_CLINIC_OR_DEPARTMENT_OTHER)
Admission: RE | Admit: 2022-03-09 | Discharge: 2022-03-09 | Disposition: A | Discharge status: Home / Self Care | Payer: Medicare PPO | Source: Ambulatory Visit | Attending: Internal Medicine | Admitting: Internal Medicine

## 2022-03-09 DIAGNOSIS — E1169 Type 2 diabetes mellitus with other specified complication: Secondary | ICD-10-CM | POA: Insufficient documentation

## 2022-03-09 DIAGNOSIS — E785 Hyperlipidemia, unspecified: Secondary | ICD-10-CM | POA: Insufficient documentation

## 2022-03-27 ENCOUNTER — Telehealth: Payer: Self-pay

## 2022-03-27 NOTE — Chronic Care Management (AMB) (Signed)
Novo Nordisk patient assistance program notification:  120- day supply of Ozempic 0.25/0.'5mg'$  will be filled on 04/08/2022 and should arrive to the office in 10-14 business days.    [name], [place] Pharmacist Assistant [phone]

## 2022-04-09 ENCOUNTER — Encounter: Payer: Self-pay | Admitting: Internal Medicine

## 2022-04-09 ENCOUNTER — Ambulatory Visit (INDEPENDENT_AMBULATORY_CARE_PROVIDER_SITE_OTHER): Payer: Medicare PPO | Admitting: Internal Medicine

## 2022-04-09 VITALS — BP 130/80 | HR 98 | Temp 98.6°F | Ht 67.0 in | Wt 241.8 lb

## 2022-04-09 DIAGNOSIS — E785 Hyperlipidemia, unspecified: Secondary | ICD-10-CM

## 2022-04-09 DIAGNOSIS — E1169 Type 2 diabetes mellitus with other specified complication: Secondary | ICD-10-CM | POA: Diagnosis not present

## 2022-04-09 DIAGNOSIS — Z0000 Encounter for general adult medical examination without abnormal findings: Secondary | ICD-10-CM | POA: Diagnosis not present

## 2022-04-09 DIAGNOSIS — Z23 Encounter for immunization: Secondary | ICD-10-CM | POA: Diagnosis not present

## 2022-04-09 DIAGNOSIS — Z1211 Encounter for screening for malignant neoplasm of colon: Secondary | ICD-10-CM

## 2022-04-09 DIAGNOSIS — I10 Essential (primary) hypertension: Secondary | ICD-10-CM

## 2022-04-09 LAB — POCT URINALYSIS DIPSTICK
Bilirubin, UA: NEGATIVE
Glucose, UA: NEGATIVE
Ketones, UA: NEGATIVE
Leukocytes, UA: NEGATIVE
Nitrite, UA: NEGATIVE
Protein, UA: NEGATIVE
Spec Grav, UA: >=1.03 — AB (ref 1.010–1.025)
Urobilinogen, UA: 0.2 E.U./dL
pH, UA: 5.5 (ref 5.0–8.0)

## 2022-04-09 NOTE — Progress Notes (Signed)
Barnet Glasgow Martin,acting as a Education administrator for [name], MD.,have documented all relevant documentation on the behalf of [name], MD,as directed by  [name], MD while in the presence of [name], MD.   Subjective:     Patient ID: [name] , female    DOB: Nov 25, 1950 , 71 y.o.   MRN: [id number]   Chief Complaint  Patient presents with   Annual Exam   Diabetes   Hypertension    HPI  Patient here from [facility]. She is no longer followed by a GYN provider. She reports compliance with meds. She denies having any headaches, chest pain and shortness of breath.   Diabetes She presents for her follow-up diabetic visit. She has type 2 diabetes mellitus. Her disease course has been improving. There are no hypoglycemic associated symptoms. There are no diabetic associated symptoms. Pertinent negatives for diabetes include no blurred vision. There are no hypoglycemic complications. There are no diabetic complications. Risk factors for coronary artery disease include diabetes mellitus, hypertension, obesity and post-menopausal. She is compliant with treatment most of the time. Her breakfast blood glucose is taken between 8-9 am. Her breakfast blood glucose range is generally 70-90 mg/dl. Eye exam is current.  Hypertension This is a chronic problem. The current episode started more than 1 year ago. The problem is unchanged. The problem is controlled. Pertinent negatives include no blurred vision, neck pain, orthopnea or PND.     Past Medical History:  Diagnosis Date   Diabetes mellitus without complication (HCC)    GERD (gastroesophageal reflux disease)    Headache(784.0)    High cholesterol    Hypertension    Sleep apnea    mild- no CPAP   Stroke ([place]) 2005   mini- stroke due to HTN     Family History  Problem Relation Age of Onset   Cancer Mother    Hypertension Mother    Cancer Maternal Grandmother    Hypertension Maternal Grandmother      Current Outpatient  Medications:    Alcohol Swabs (DROPSAFE ALCOHOL PREP) 70 % PADS, USE AS DIRECTED WHEN CHECKING BLOOD SUGARS, Disp: 300 each, Rfl: 3   Blood Glucose Calibration (TRUE METRIX LEVEL 1) Low SOLN, Use as directed to check blood sugars 1 time per dx: e11.22, Disp: 2 each, Rfl: 2   Blood Glucose Monitoring Suppl (TRUE METRIX AIR GLUCOSE METER) w/Device KIT, Inject 1 kit into the skin daily. Use as directed to check blood sugars 1 time per dx: e11.22, Disp: 1 kit, Rfl: 1   Cholecalciferol (VITAMIN D) 2000 UNITS tablet, Take 2,000 Units by mouth daily. , Disp: , Rfl:    latanoprost (XALATAN) 0.005 % ophthalmic solution, INSTILL 1 DROP INTO OU HS, Disp: , Rfl:    levocetirizine (XYZAL) 5 MG tablet, Take 5 mg by mouth daily as needed for allergies., Disp: , Rfl:    metFORMIN (GLUCOPHAGE-XR) 750 MG 24 hr tablet, TAKE 1 TABLET EVERY DAY WITH EVENING MEAL, Disp: 90 tablet, Rfl: 3   Multiple Vitamin (MULTIVITAMIN WITH MINERALS) TABS tablet, Take 1 tablet by mouth daily., Disp: , Rfl:    Olmesartan-amLODIPine-HCTZ 40-5-12.5 MG TABS, TAKE 1 TABLET EVERY DAY, Disp: 90 tablet, Rfl: 3   Semaglutide,0.25 or 0.5MG/DOS, (OZEMPIC, 0.25 OR 0.5 MG/DOSE,) 2 MG/1.5ML SOPN, Inject 0.5 mg into the skin once a week., Disp: 3 mL, Rfl: 1   Triamcinolone Acetonide (NASACORT AQ NA), Place into the nose., Disp: , Rfl:    TRUE METRIX BLOOD  GLUCOSE TEST test strip, USE AS DIRECTED TO CHECK BLOOD SUGARS, Disp: 300 strip, Rfl: 3   TRUEplus Lancets 33G MISC, USE AS DIRECTED TO CHECK BLOOD SUGARS, Disp: 300 each, Rfl: 3   diclofenac Sodium (VOLTAREN) 1 % GEL, Place onto the skin. (Patient not taking: Reported on 12/07/2021), Disp: , Rfl:    Allergies  Allergen Reactions   Codeine Nausea And Vomiting and Diarrhea   Meperidine     Other reaction(s): Delusions (intolerance)      The patient states she uses post menopausal status for birth control. Last LMP was Patient's last menstrual period was 01/23/2013.. Negative for Dysmenorrhea.  Negative for: breast discharge, breast lump(s), breast pain and breast self exam. Associated symptoms include abnormal vaginal bleeding. Pertinent negatives include abnormal bleeding (hematology), anxiety, decreased libido, depression, difficulty falling sleep, dyspareunia, history of infertility, nocturia, sexual dysfunction, sleep disturbances, urinary incontinence, urinary urgency, vaginal discharge and vaginal itching. Diet regular.The patient states her exercise level is  3 days/week.   . The patient's tobacco use is:  Social History   Tobacco Use  Smoking Status Never  Smokeless Tobacco Never  . She has been exposed to passive smoke. The patient's alcohol use is:  Social History   Substance and Sexual Activity  Alcohol Use Yes   Comment: occassionally    Review of Systems  Constitutional: Negative.   HENT: Negative.    Eyes: Negative.  Negative for blurred vision.  Respiratory: Negative.    Cardiovascular: Negative.  Negative for orthopnea and PND.  Gastrointestinal: Negative.   Endocrine: Negative.   Genitourinary: Negative.   Musculoskeletal: Negative.  Negative for neck pain.  Skin: Negative.   Allergic/Immunologic: Negative.   Neurological: Negative.   Hematological: Negative.   Psychiatric/Behavioral: Negative.       Today's Vitals   04/09/22 1413 04/09/22 1443  BP: (!) 140/70 130/80  Pulse: 98   Temp: 98.6 F (37 C)   TempSrc: Oral   Weight: 241 lb 12.8 oz (109.7 kg)   Height: 5' 7" (1.702 m)   PainSc: 0-No pain    Body mass index is 37.87 kg/m.  Wt Readings from Last 3 Encounters:  04/09/22 241 lb 12.8 oz (109.7 kg)  02/15/22 243 lb 12.8 oz (110.6 kg)  01/02/22 242 lb 9.6 oz (110 kg)     Objective:  Physical Exam Vitals and nursing note reviewed.  Constitutional:      Appearance: Normal appearance. She is obese.  HENT:     Head: Normocephalic and atraumatic.     Right Ear: Tympanic membrane, ear canal and external ear normal.     Left Ear:  Tympanic membrane, ear canal and external ear normal.     Nose:     Comments: Masked     Mouth/Throat:     Comments: Masked  Eyes:     Extraocular Movements: Extraocular movements intact.     Conjunctiva/sclera: Conjunctivae normal.     Pupils: Pupils are equal, round, and reactive to light.  Cardiovascular:     Rate and Rhythm: Normal rate and regular rhythm.     Pulses: Normal pulses.          Dorsalis pedis pulses are 2+ on the right side and 2+ on the left side.     Heart sounds: Normal heart sounds.  Pulmonary:     Effort: Pulmonary effort is normal.     Breath sounds: Normal breath sounds.  Chest:  Breasts:    Tanner Score is 5.       Right: Normal.     Left: Normal.  Abdominal:     General: Bowel sounds are normal.     Palpations: Abdomen is soft.     Comments: Obese, soft.  Genitourinary:    Comments: deferred Musculoskeletal:        General: Normal range of motion.     Cervical back: Normal range of motion and neck supple.  Feet:     Right foot:     Protective Sensation: 5 sites tested.  5 sites sensed.     Skin integrity: Skin integrity normal.     Toenail Condition: Right toenails are normal.     Left foot:     Protective Sensation: 5 sites tested.  5 sites sensed.     Skin integrity: Skin integrity normal.     Toenail Condition: Left toenails are normal.  Skin:    General: Skin is warm and dry.  Neurological:     General: No focal deficit present.     Mental Status: She is alert and oriented to person, place, and time.  Psychiatric:        Mood and Affect: Mood normal.        Behavior: Behavior normal.     Assessment And Plan:     1. Annual physical exam Comments: A full exam was performed. Importance of monthly self breast exams was discussed with the patient. She is up to date with breast cancer screening.  PATIENT IS ADVISED TO GET 30-45 MINUTES REGULAR EXERCISE NO LESS THAN FOUR TO FIVE DAYS PER WEEK - BOTH WEIGHTBEARING EXERCISES AND AEROBIC ARE  RECOMMENDED.  PATIENT IS ADVISED TO FOLLOW A HEALTHY DIET WITH AT LEAST SIX FRUITS/VEGGIES PER DAY, DECREASE INTAKE OF RED MEAT, AND TO INCREASE FISH INTAKE TO TWO DAYS PER WEEK.  MEATS/FISH SHOULD NOT BE FRIED, BAKED OR BROILED IS PREFERABLE.  IT IS ALSO IMPORTANT TO CUT BACK ON YOUR SUGAR INTAKE. PLEASE AVOID ANYTHING WITH ADDED SUGAR, CORN SYRUP OR OTHER SWEETENERS. IF YOU MUST USE A SWEETENER, YOU CAN TRY STEVIA. IT IS ALSO IMPORTANT TO AVOID ARTIFICIALLY SWEETENERS AND DIET BEVERAGES. LASTLY, I SUGGEST WEARING SPF 50 SUNSCREEN ON EXPOSED PARTS AND ESPECIALLY WHEN IN THE DIRECT SUNLIGHT FOR AN EXTENDED PERIOD OF TIME.  PLEASE AVOID FAST FOOD RESTAURANTS AND INCREASE YOUR WATER INTAKE. - EKG 12-Lead - POCT Urinalysis Dipstick (81002) - Microalbumin / Creatinine Urine Ratio  2. Dyslipidemia associated with type 2 diabetes mellitus ([place]) Comments: Chronic, diabetic foot exam was performed.  LDL goal <70. Advised to incorporate more exercise into her daily routine. F/u in 4 months. I DISCUSSED WITH THE PATIENT AT LENGTH REGARDING THE GOALS OF GLYCEMIC CONTROL AND POSSIBLE LONG-TERM COMPLICATIONS.  I  ALSO STRESSED THE IMPORTANCE OF COMPLIANCE WITH HOME GLUCOSE MONITORING, DIETARY RESTRICTIONS INCLUDING AVOIDANCE OF SUGARY DRINKS/PROCESSED FOODS,  ALONG WITH REGULAR EXERCISE.  I  ALSO STRESSED THE IMPORTANCE OF ANNUAL EYE EXAMS, SELF FOOT CARE AND COMPLIANCE WITH OFFICE VISITS.  - POCT Urinalysis Dipstick (81002) - Microalbumin / Creatinine Urine Ratio - CBC no Diff - Hemoglobin A1c - CMP14+EGFR - Lipid panel  3. Essential hypertension, benign Comments: Chronic, uncontrolled.  EKG performed, NSR w/o acute changes. Repeat BP improved, 130/80. She will c/w olmesartan/amlodipine/hctz 40/5/12.64m qd.  - EKG 12-Lead - POCT Urinalysis Dipstick (81002) - Microalbumin / Creatinine Urine Ratio  4. Screen for colon cancer Comments: I will refer her to GI for CRC screening.   5. Immunization due - Flu  Vaccine QUAD High Dose(Fluad)  Patient was given opportunity to ask questions. Patient verbalized understanding of the plan and was able to repeat key elements of the plan. All questions were answered to their satisfaction.   I, Robyn N Sanders, MD, have reviewed all documentation for this visit. The documentation on 04/09/22 for the exam, diagnosis, procedures, and orders are all accurate and complete.   THE PATIENT IS ENCOURAGED TO PRACTICE SOCIAL DISTANCING DUE TO THE COVID-19 PANDEMIC.   

## 2022-04-09 NOTE — Patient Instructions (Signed)

## 2022-04-10 LAB — LIPID PANEL
Chol/HDL Ratio: 2.8 ratio (ref 0.0–4.4)
Cholesterol, Total: 159 mg/dL (ref 100–199)
HDL: 57 mg/dL (ref >39)
LDL Chol Calc (NIH): 81 mg/dL (ref 0–99)
Triglycerides: 119 mg/dL (ref 0–149)
VLDL Cholesterol Cal: 21 mg/dL (ref 5–40)

## 2022-04-10 LAB — CMP14+EGFR
ALT: 13 IU/L (ref 0–32)
AST: 18 IU/L (ref 0–40)
Albumin/Globulin Ratio: 1.6 (ref 1.2–2.2)
Albumin: 4.2 g/dL (ref 3.8–4.8)
Alkaline Phosphatase: 101 IU/L (ref 44–121)
BUN/Creatinine Ratio: 21 (ref 12–28)
BUN: 15 mg/dL (ref 8–27)
Bilirubin Total: 0.2 mg/dL (ref 0.0–1.2)
CO2: 21 mmol/L (ref 20–29)
Calcium: 9.6 mg/dL (ref 8.7–10.3)
Chloride: 102 mmol/L (ref 96–106)
Creatinine, Ser: 0.73 mg/dL (ref 0.57–1.00)
Globulin, Total: 2.7 g/dL (ref 1.5–4.5)
Glucose: 91 mg/dL (ref 70–99)
Potassium: 3.6 mmol/L (ref 3.5–5.2)
Sodium: 140 mmol/L (ref 134–144)
Total Protein: 6.9 g/dL (ref 6.0–8.5)
eGFR: 88 mL/min/{1.73_m2} (ref >59)

## 2022-04-10 LAB — MICROALBUMIN / CREATININE URINE RATIO
Creatinine, Urine: 131.7 mg/dL
Microalb/Creat Ratio: 6 mg/g creat (ref 0–29)
Microalbumin, Urine: 8.4 ug/mL

## 2022-04-10 LAB — CBC
Hematocrit: 44 % (ref 34.0–46.6)
Hemoglobin: 14.4 g/dL (ref 11.1–15.9)
MCH: 25.9 pg — ABNORMAL LOW (ref 26.6–33.0)
MCHC: 32.7 g/dL (ref 31.5–35.7)
MCV: 79 fL (ref 79–97)
Platelets: 434 10*3/uL (ref 150–450)
RBC: 5.56 x10E6/uL — ABNORMAL HIGH (ref 3.77–5.28)
RDW: 14.2 % (ref 11.7–15.4)
WBC: 6 10*3/uL (ref 3.4–10.8)

## 2022-04-10 LAB — HEMOGLOBIN A1C
Est. average glucose Bld gHb Est-mCnc: 126 mg/dL
Hgb A1c MFr Bld: 6 % — ABNORMAL HIGH (ref 4.8–5.6)

## 2022-04-12 ENCOUNTER — Other Ambulatory Visit: Payer: Self-pay

## 2022-04-12 MED ORDER — TRUE METRIX LEVEL 1 LOW VI SOLN: 2 refills | Status: DC

## 2022-04-12 MED ORDER — TRUEPLUS LANCETS 33G MISC: 3 refills | Status: DC

## 2022-04-12 MED ORDER — TRUE METRIX AIR GLUCOSE METER W/DEVICE KIT: 1.0000 | PACK | Freq: Every day | 1 refills | Status: AC

## 2022-04-12 MED ORDER — TRUE METRIX BLOOD GLUCOSE TEST VI STRP: ORAL_STRIP | 3 refills | Status: DC

## 2022-05-02 ENCOUNTER — Telehealth: Payer: Self-pay

## 2022-05-02 NOTE — Chronic Care Management (AMB) (Signed)
[employer] patient assistance program notification:  120- day supply of Ozempic 0.25/0.5 mg was filled on 04/10/2022 and should arrive to the office in 10-14 business days. Patient has 0  refill remaining and enrollment will expire on 07/14/2022.  No further action required, patient due for re-enrollment for 2024 year.   [name], [place] Pharmacist Assistant [phone]

## 2022-06-19 ENCOUNTER — Telehealth: Payer: Self-pay

## 2022-06-19 NOTE — Chronic Care Management (AMB) (Signed)
Faxed 2024 re-enrollment application to [employer] patient assistance for Ozempic.   [name], [place] Pharmacist Assistant [phone]

## 2022-06-25 ENCOUNTER — Encounter: Payer: Self-pay | Admitting: Internal Medicine

## 2022-06-26 ENCOUNTER — Encounter: Payer: Self-pay | Admitting: Internal Medicine

## 2022-06-26 ENCOUNTER — Ambulatory Visit: Payer: Medicare PPO | Admitting: Internal Medicine

## 2022-06-26 VITALS — BP 132/80 | HR 70 | Temp 98.5°F | Ht 67.0 in | Wt 243.0 lb

## 2022-06-26 DIAGNOSIS — I7300 Raynaud's syndrome without gangrene: Secondary | ICD-10-CM | POA: Diagnosis not present

## 2022-06-26 DIAGNOSIS — M79641 Pain in right hand: Secondary | ICD-10-CM | POA: Diagnosis not present

## 2022-06-26 DIAGNOSIS — Z6838 Body mass index (BMI) 38.0-38.9, adult: Secondary | ICD-10-CM

## 2022-06-26 DIAGNOSIS — Z2821 Immunization not carried out because of patient refusal: Secondary | ICD-10-CM | POA: Diagnosis not present

## 2022-06-26 DIAGNOSIS — I10 Essential (primary) hypertension: Secondary | ICD-10-CM | POA: Diagnosis not present

## 2022-06-26 DIAGNOSIS — E785 Hyperlipidemia, unspecified: Secondary | ICD-10-CM

## 2022-06-26 DIAGNOSIS — M79642 Pain in left hand: Secondary | ICD-10-CM | POA: Diagnosis not present

## 2022-06-26 DIAGNOSIS — E1169 Type 2 diabetes mellitus with other specified complication: Secondary | ICD-10-CM | POA: Diagnosis not present

## 2022-06-26 NOTE — Progress Notes (Signed)
[name],acting as a Education administrator for [name], MD.,have documented all relevant documentation on the behalf of [name], MD,as directed by  [name], MD while in the presence of [name], MD.    Subjective:     Patient ID: [name] , female    DOB: 1951/06/22 , 71 y.o.   MRN: [id number]   Chief Complaint  Patient presents with   Diabetes   Hypertension    HPI  She presents today for DM/HTN check. She reports compliance with meds. She denies headaches, chest pain and shortness of breath.  Diabetes She presents for her follow-up diabetic visit. She has type 2 diabetes mellitus. Her disease course has been improving. There are no hypoglycemic associated symptoms. There are no diabetic associated symptoms. Pertinent negatives for diabetes include no blurred vision, no chest pain, no polydipsia, no polyphagia and no polyuria. There are no hypoglycemic complications. There are no diabetic complications. Risk factors for coronary artery disease include diabetes mellitus, hypertension, obesity and post-menopausal. She is compliant with treatment most of the time. She is following a generally healthy diet. She participates in exercise intermittently. Her breakfast blood glucose is taken between 8-9 am. Her breakfast blood glucose range is generally 70-90 mg/dl. Eye exam is current.  Hypertension This is a chronic problem. The current episode started more than 1 year ago. The problem is unchanged. The problem is controlled. Pertinent negatives include no blurred vision, chest pain, orthopnea, palpitations, PND or shortness of breath. The current treatment provides moderate improvement.     Past Medical History:  Diagnosis Date   Diabetes mellitus without complication (HCC)    GERD (gastroesophageal reflux disease)    Headache(784.0)    High cholesterol    Hypertension    Sleep apnea    mild- no CPAP   Stroke ([place]) 2005   mini- stroke due to HTN      Family History  Problem Relation Age of Onset   Cancer Mother    Hypertension Mother    Cancer Maternal Grandmother    Hypertension Maternal Grandmother      Current Outpatient Medications:    Alcohol Swabs (DROPSAFE ALCOHOL PREP) 70 % PADS, USE AS DIRECTED WHEN CHECKING BLOOD SUGARS, Disp: 300 each, Rfl: 3   Blood Glucose Calibration (TRUE METRIX LEVEL 1) Low SOLN, Use as directed to check blood sugars 1 time per dx: e11.22, Disp: 2 each, Rfl: 2   Blood Glucose Monitoring Suppl (TRUE METRIX AIR GLUCOSE METER) w/Device KIT, Inject 1 kit into the skin daily. Use as directed to check blood sugars 1 time per dx: e11.22, Disp: 1 kit, Rfl: 1   Cholecalciferol (VITAMIN D) 2000 UNITS tablet, Take 2,000 Units by mouth daily. , Disp: , Rfl:    diclofenac Sodium (VOLTAREN) 1 % GEL, Place onto the skin., Disp: , Rfl:    glucose blood (TRUE METRIX BLOOD GLUCOSE TEST) test strip, Use as instructed, Disp: 300 strip, Rfl: 3   latanoprost (XALATAN) 0.005 % ophthalmic solution, INSTILL 1 DROP INTO OU HS, Disp: , Rfl:    levocetirizine (XYZAL) 5 MG tablet, Take 5 mg by mouth daily as needed for allergies., Disp: , Rfl:    metFORMIN (GLUCOPHAGE-XR) 750 MG 24 hr tablet, TAKE 1 TABLET EVERY DAY WITH EVENING MEAL, Disp: 90 tablet, Rfl: 3   Multiple Vitamin (MULTIVITAMIN WITH MINERALS) TABS tablet, Take 1 tablet by mouth daily., Disp: , Rfl:    Olmesartan-amLODIPine-HCTZ 40-5-12.5 MG TABS, TAKE 1 TABLET  EVERY DAY, Disp: 90 tablet, Rfl: 3   Semaglutide,0.25 or 0.5MG/DOS, (OZEMPIC, 0.25 OR 0.5 MG/DOSE,) 2 MG/1.5ML SOPN, Inject 0.5 mg into the skin once a week., Disp: 3 mL, Rfl: 1   Triamcinolone Acetonide (NASACORT AQ NA), Place into the nose., Disp: , Rfl:    TRUEplus Lancets 33G MISC, Use as directed to check blood sugars E11.69, Disp: 300 each, Rfl: 3   Allergies  Allergen Reactions   Codeine Nausea And Vomiting and Diarrhea   Meperidine     Other reaction(s): Delusions (intolerance)     Review of  Systems  Constitutional: Negative.   Eyes:  Negative for blurred vision.  Respiratory: Negative.  Negative for shortness of breath.   Cardiovascular: Negative.  Negative for chest pain, palpitations, orthopnea and PND.  Gastrointestinal: Negative.   Endocrine: Negative for polydipsia, polyphagia and polyuria.  Musculoskeletal:        She c/o hand pain. States her hands turn white when they are cold. This has been going on for awhile, she admits she didn't mention it.  At times, there is associated tingling as well. She does spend time in her garden/yard on a regular basis.   Neurological: Negative.   Psychiatric/Behavioral: Negative.       Today's Vitals   06/26/22 1606  BP: 132/80  Pulse: 70  Temp: 98.5 F (36.9 C)  Weight: 243 lb (110.2 kg)  Height: _0  (1.702 m)  PainSc: 0-No pain   Body mass index is 38.06 kg/m.  Wt Readings from Last 3 Encounters:  06/26/22 243 lb (110.2 kg)  04/09/22 241 lb 12.8 oz (109.7 kg)  02/15/22 243 lb 12.8 oz (110.6 kg)     Objective:  Physical Exam Vitals and nursing note reviewed.  Constitutional:      Appearance: Normal appearance. She is obese.  HENT:     Head: Normocephalic and atraumatic.     Nose:     Comments: Masked     Mouth/Throat:     Comments: Masked  Eyes:     Extraocular Movements: Extraocular movements intact.  Cardiovascular:     Rate and Rhythm: Normal rate and regular rhythm.     Heart sounds: Normal heart sounds.  Pulmonary:     Effort: Pulmonary effort is normal.     Breath sounds: Normal breath sounds.  Musculoskeletal:     Cervical back: Normal range of motion.     Comments: B/l MCP swelling  Skin:    General: Skin is warm.  Neurological:     General: No focal deficit present.     Mental Status: She is alert.  Psychiatric:        Mood and Affect: Mood normal.        Behavior: Behavior normal.       Assessment And Plan:     1. Dyslipidemia associated with type 2 diabetes mellitus ([place]) Comments:  Chronic, I will check labs as below. She will c/w Ozempic and Metformin XR 727m qd. I would like to increase her Ozempic dose to 119mweekly to get increased weight loss benefit. I will check labs and make further recommendations at that time. She will f/u in 3-4 months.  - BMP8+EGFR - Hemoglobin A1c  2. Essential hypertension, benign Comments: Chroinc, fair control. Goal BP<120/80. She is encouraged to c/w [place] 40/5/12.86m63maily. She is encouraged to decrease her salt intake.  3. Raynaud's syndrome without gangrene Comments: I will refer her to Rheum for further evaluation/treatment. Reminded to keep hands warm and  to wear gloves in the winter. She is already on amlodipine for HTN. - CBC no Diff - ANA, IFA (with reflex) - CYCLIC CITRUL PEPTIDE ANTIBODY, IGG/IGA - Rheumatoid factor - Sedimentation rate - Uric acid  4. Pain in both hands Comments: Negative squeeze test. I will check an arthritis panel today. She would likely benefit from an anti-inflammatory diet.  5. Class 2 severe obesity due to excess calories with serious comorbidity and body mass index (BMI) of 38.0 to 38.9 in adult [facility]) Comments: She is encouraged to aim for at least 150 minutes of exercise/week, while striving for BMI<30 to decrease cardiac risk.  6. Herpes zoster vaccination declined  Patient was given opportunity to ask questions. Patient verbalized understanding of the plan and was able to repeat key elements of the plan. All questions were answered to their satisfaction.   I, [name], MD, have reviewed all documentation for this visit. The documentation on 06/30/22 for the exam, diagnosis, procedures, and orders are all accurate and complete.   IF YOU HAVE BEEN REFERRED TO A SPECIALIST, IT MAY TAKE 1-2 WEEKS TO SCHEDULE/PROCESS THE REFERRAL. IF YOU HAVE NOT HEARD FROM US/SPECIALIST IN TWO WEEKS, PLEASE GIVE [place] A CALL AT [phone] X [phone].   THE PATIENT IS ENCOURAGED TO PRACTICE SOCIAL DISTANCING  DUE TO THE COVID-19 PANDEMIC.

## 2022-06-26 NOTE — Patient Instructions (Signed)

## 2022-06-27 LAB — CBC
Hematocrit: 42.5 % (ref 34.0–46.6)
Hemoglobin: 13.8 g/dL (ref 11.1–15.9)
MCH: 25.9 pg — ABNORMAL LOW (ref 26.6–33.0)
MCHC: 32.5 g/dL (ref 31.5–35.7)
MCV: 80 fL (ref 79–97)
Platelets: 382 10*3/uL (ref 150–450)
RBC: 5.33 x10E6/uL — ABNORMAL HIGH (ref 3.77–5.28)
RDW: 14.2 % (ref 11.7–15.4)
WBC: 6.2 10*3/uL (ref 3.4–10.8)

## 2022-06-27 LAB — HEMOGLOBIN A1C
Est. average glucose Bld gHb Est-mCnc: 123 mg/dL
Hgb A1c MFr Bld: 5.9 % — ABNORMAL HIGH (ref 4.8–5.6)

## 2022-06-27 LAB — RHEUMATOID FACTOR: Rheumatoid fact SerPl-aCnc: 20.9 IU/mL — ABNORMAL HIGH (ref <14.0)

## 2022-06-27 LAB — BMP8+EGFR
BUN/Creatinine Ratio: 20 (ref 12–28)
BUN: 15 mg/dL (ref 8–27)
CO2: 25 mmol/L (ref 20–29)
Calcium: 10 mg/dL (ref 8.7–10.3)
Chloride: 104 mmol/L (ref 96–106)
Creatinine, Ser: 0.76 mg/dL (ref 0.57–1.00)
Glucose: 88 mg/dL (ref 70–99)
Potassium: 4.3 mmol/L (ref 3.5–5.2)
Sodium: 141 mmol/L (ref 134–144)
eGFR: 84 mL/min/{1.73_m2} (ref >59)

## 2022-06-27 LAB — CYCLIC CITRUL PEPTIDE ANTIBODY, IGG/IGA: Cyclic Citrullin Peptide Ab: 1 units (ref 0–19)

## 2022-06-27 LAB — SEDIMENTATION RATE: Sed Rate: 36 mm/hr (ref 0–40)

## 2022-06-27 LAB — URIC ACID: Uric Acid: 5.1 mg/dL (ref 3.1–7.9)

## 2022-06-27 LAB — ANTINUCLEAR ANTIBODIES, IFA: ANA Titer 1: NEGATIVE

## 2022-06-28 DIAGNOSIS — H40023 Open angle with borderline findings, high risk, bilateral: Secondary | ICD-10-CM | POA: Diagnosis not present

## 2022-06-28 DIAGNOSIS — H25813 Combined forms of age-related cataract, bilateral: Secondary | ICD-10-CM | POA: Diagnosis not present

## 2022-07-05 ENCOUNTER — Other Ambulatory Visit: Payer: Self-pay | Admitting: Internal Medicine

## 2022-07-05 DIAGNOSIS — Z1231 Encounter for screening mammogram for malignant neoplasm of breast: Secondary | ICD-10-CM

## 2022-07-20 ENCOUNTER — Encounter: Payer: Self-pay | Admitting: Internal Medicine

## 2022-07-27 ENCOUNTER — Other Ambulatory Visit: Payer: Self-pay | Admitting: Internal Medicine

## 2022-07-27 ENCOUNTER — Telehealth: Payer: Self-pay

## 2022-07-27 DIAGNOSIS — I7300 Raynaud's syndrome without gangrene: Secondary | ICD-10-CM

## 2022-07-27 NOTE — Progress Notes (Addendum)
Patient approved to receive Ozempic through [employer] patient assistance program. Enrollment ends 07/16/2023.   [name], [place] Pharmacist Assistant [phone]

## 2022-07-27 NOTE — Progress Notes (Addendum)
07-27-2022: patient is aware that Ozempic is ready and can be picked up next week.   [place] Pharmacist Assistant [phone]

## 2022-08-01 ENCOUNTER — Encounter: Payer: Self-pay | Admitting: Internal Medicine

## 2022-08-01 ENCOUNTER — Ambulatory Visit: Payer: Medicare PPO | Admitting: Internal Medicine

## 2022-08-01 VITALS — BP 124/70 | HR 76 | Temp 98.2°F | Ht 67.0 in | Wt 243.0 lb

## 2022-08-01 DIAGNOSIS — E785 Hyperlipidemia, unspecified: Secondary | ICD-10-CM

## 2022-08-01 DIAGNOSIS — I10 Essential (primary) hypertension: Secondary | ICD-10-CM

## 2022-08-01 DIAGNOSIS — Z6838 Body mass index (BMI) 38.0-38.9, adult: Secondary | ICD-10-CM | POA: Diagnosis not present

## 2022-08-01 DIAGNOSIS — I7300 Raynaud's syndrome without gangrene: Secondary | ICD-10-CM

## 2022-08-01 DIAGNOSIS — E1169 Type 2 diabetes mellitus with other specified complication: Secondary | ICD-10-CM | POA: Diagnosis not present

## 2022-08-01 NOTE — Patient Instructions (Signed)

## 2022-08-01 NOTE — Progress Notes (Signed)
[name],acting as a Education administrator for Grace Greenland, MD.,have documented all relevant documentation on the behalf of Grace Greenland, MD,as directed by  Grace Greenland, MD while in the presence of Grace Greenland, MD.    Subjective:     Patient ID: [name] , female    DOB: 1950/11/20 , 72 y.o.   MRN: [id number]   Chief Complaint  Patient presents with   Diabetes   Hypertension    HPI  y6She presents today for DM/HTN check. She reports compliance with meds. She denies headaches, chest pain and shortness of breath.  At her last visit, dose of Ozempic was increased to '1mg'$ . She has yet to start higher dose, she is due to pick up patient assistance supply today.   Diabetes She presents for her follow-up diabetic visit. She has type 2 diabetes mellitus. Her disease course has been improving. There are no hypoglycemic associated symptoms. There are no diabetic associated symptoms. Pertinent negatives for diabetes include no blurred vision, no chest pain, no polydipsia, no polyphagia and no polyuria. There are no hypoglycemic complications. There are no diabetic complications. Risk factors for coronary artery disease include diabetes mellitus, hypertension, obesity and post-menopausal. She is compliant with treatment most of the time. She is following a generally healthy diet. She participates in exercise intermittently. Her breakfast blood glucose is taken between 8-9 am. Her breakfast blood glucose range is generally 70-90 mg/dl. Eye exam is current.  Hypertension This is a chronic problem. The current episode started more than 1 year ago. The problem is unchanged. The problem is controlled. Pertinent negatives include no blurred vision, chest pain, orthopnea, palpitations, PND or shortness of breath. The current treatment provides moderate improvement.     Past Medical History:  Diagnosis Date   Diabetes mellitus without complication (HCC)    GERD (gastroesophageal reflux  disease)    Headache(784.0)    High cholesterol    Hypertension    Sleep apnea    mild- no CPAP   Stroke ([place]) 2005   mini- stroke due to HTN     Family History  Problem Relation Age of Onset   Cancer Mother    Hypertension Mother    Cancer Maternal Grandmother    Hypertension Maternal Grandmother      Current Outpatient Medications:    Alcohol Swabs (DROPSAFE ALCOHOL PREP) 70 % PADS, USE AS DIRECTED WHEN CHECKING BLOOD SUGARS, Disp: 300 each, Rfl: 3   Blood Glucose Calibration (TRUE METRIX LEVEL 1) Low SOLN, Use as directed to check blood sugars 1 time per dx: e11.22, Disp: 2 each, Rfl: 2   Blood Glucose Monitoring Suppl (TRUE METRIX AIR GLUCOSE METER) w/Device KIT, Inject 1 kit into the skin daily. Use as directed to check blood sugars 1 time per dx: e11.22, Disp: 1 kit, Rfl: 1   Cholecalciferol (VITAMIN D) 2000 UNITS tablet, Take 2,000 Units by mouth daily. , Disp: , Rfl:    glucose blood (TRUE METRIX BLOOD GLUCOSE TEST) test strip, Use as instructed, Disp: 300 strip, Rfl: 3   latanoprost (XALATAN) 0.005 % ophthalmic solution, INSTILL 1 DROP INTO OU HS, Disp: , Rfl:    levocetirizine (XYZAL) 5 MG tablet, Take 5 mg by mouth daily as needed for allergies., Disp: , Rfl:    metFORMIN (GLUCOPHAGE-XR) 750 MG 24 hr tablet, TAKE 1 TABLET EVERY DAY WITH EVENING MEAL, Disp: 90 tablet, Rfl: 3   Multiple Vitamin (MULTIVITAMIN WITH MINERALS) TABS tablet, Take 1 tablet by mouth  daily., Disp: , Rfl:    Olmesartan-amLODIPine-HCTZ 40-5-12.5 MG TABS, TAKE 1 TABLET EVERY DAY, Disp: 90 tablet, Rfl: 3   Semaglutide, 1 MG/DOSE, 4 MG/3ML SOPN, Inject 1 mg into the skin once a week., Disp: 3 mL, Rfl: 0   Triamcinolone Acetonide (NASACORT AQ NA), Place into the nose., Disp: , Rfl:    TRUEplus Lancets 33G MISC, Use as directed to check blood sugars E11.69, Disp: 300 each, Rfl: 3   diclofenac Sodium (VOLTAREN) 1 % GEL, Place onto the skin. (Patient not taking: Reported on 08/01/2022), Disp: , Rfl:     Allergies  Allergen Reactions   Codeine Nausea And Vomiting and Diarrhea   Meperidine     Other reaction(s): Delusions (intolerance)     Review of Systems  Constitutional: Negative.   Eyes: Negative.  Negative for blurred vision.  Respiratory: Negative.  Negative for shortness of breath.   Cardiovascular: Negative.  Negative for chest pain, palpitations, orthopnea and PND.  Gastrointestinal: Negative.   Endocrine: Negative for polydipsia, polyphagia and polyuria.  Musculoskeletal: Negative.   Skin: Negative.   Neurological: Negative.   Psychiatric/Behavioral: Negative.       Today's Vitals   08/01/22 1436  BP: 124/70  Pulse: 76  Temp: 98.2 F (36.8 C)  Weight: 243 lb (110.2 kg)  Height: '5\' 7"'$  (1.702 m)  PainSc: 0-No pain   Body mass index is 38.06 kg/m.  Wt Readings from Last 3 Encounters:  08/01/22 243 lb (110.2 kg)  06/26/22 243 lb (110.2 kg)  04/09/22 241 lb 12.8 oz (109.7 kg)    BP Readings from Last 3 Encounters:  08/01/22 124/70  06/26/22 132/80  04/09/22 130/80     Objective:  Physical Exam Vitals and nursing note reviewed.  Constitutional:      Appearance: Normal appearance. She is obese.  HENT:     Head: Normocephalic and atraumatic.     Nose:     Comments: Masked     Mouth/Throat:     Comments: Masked  Eyes:     Extraocular Movements: Extraocular movements intact.  Cardiovascular:     Rate and Rhythm: Normal rate and regular rhythm.     Heart sounds: Normal heart sounds.  Pulmonary:     Effort: Pulmonary effort is normal.     Breath sounds: Normal breath sounds.  Musculoskeletal:     Cervical back: Normal range of motion.  Skin:    General: Skin is warm.  Neurological:     General: No focal deficit present.     Mental Status: She is alert.  Psychiatric:        Mood and Affect: Mood normal.        Behavior: Behavior normal.       Assessment And Plan:     1. Dyslipidemia associated with type 2 diabetes mellitus ([place]) Comments:  She was given Ozempic 0.'5mg'$ , Novo. Advised to use 0.5 PLUS 0.'25mg'$  x 4 weeks. I plan to transition her to '1mg'$  Ozempic. She verbalizes understanding of tx plan.  2. Essential hypertension, benign Comments: Chronic, no med changes. Encouraged to follow low sodium diet.  3. Raynaud's syndrome without gangrene Comments: Chronic. She requests referrral to Dr. Amil Amen. Advised to wear gloves in cold environments. - Ambulatory referral to Rheumatology  4. Class 2 severe obesity due to excess calories with serious comorbidity and body mass index (BMI) of 38.0 to 38.9 in adult [facility]) Comments: She is encouraged to aim for at least 150 minutes of exercise/week.   Patient was  given opportunity to ask questions. Patient verbalized understanding of the plan and was able to repeat key elements of the plan. All questions were answered to their satisfaction.   I, Grace Greenland, MD, have reviewed all documentation for this visit. The documentation on 08/01/22 for the exam, diagnosis, procedures, and orders are all accurate and complete.   IF YOU HAVE BEEN REFERRED TO A SPECIALIST, IT MAY TAKE 1-2 WEEKS TO SCHEDULE/PROCESS THE REFERRAL. IF YOU HAVE NOT HEARD FROM US/SPECIALIST IN TWO WEEKS, PLEASE GIVE [place] A CALL AT [phone] X [phone].   THE PATIENT IS ENCOURAGED TO PRACTICE SOCIAL DISTANCING DUE TO THE COVID-19 PANDEMIC.

## 2022-08-02 MED ORDER — SEMAGLUTIDE (1 MG/DOSE) 4 MG/3ML ~~LOC~~ SOPN: 1.0000 mg | PEN_INJECTOR | SUBCUTANEOUS | 0 refills | Status: DC

## 2022-08-13 ENCOUNTER — Telehealth: Payer: Self-pay

## 2022-08-13 NOTE — Progress Notes (Addendum)
As of January 17, patient picked up 5 boxes of Ozempic 0.25/0.5 mg. Per Dr Baird Cancer patient will be increased to 1 mg.  Filled out [employer] reorder/change request form for Ozempic 1 mg, Dr Baird Cancer signed and form faxed to [employer].    [name], [place] Pharmacist Assistant [phone]

## 2022-08-21 ENCOUNTER — Telehealth: Payer: Self-pay

## 2022-08-21 NOTE — Progress Notes (Addendum)
Opened in error, note not needed.

## 2022-08-21 NOTE — Progress Notes (Addendum)
08-21-2022: Patient was informed that ozempic is ready for pick up. Patient will pick up tomorrow.   [place] Pharmacist Assistant [phone]

## 2022-08-29 ENCOUNTER — Ambulatory Visit: Payer: Medicare PPO

## 2022-08-30 ENCOUNTER — Ambulatory Visit
Admission: RE | Admit: 2022-08-30 | Discharge: 2022-08-30 | Disposition: A | Discharge status: Home / Self Care | Payer: Medicare PPO | Source: Ambulatory Visit | Attending: Internal Medicine | Admitting: Internal Medicine

## 2022-08-30 DIAGNOSIS — Z1231 Encounter for screening mammogram for malignant neoplasm of breast: Secondary | ICD-10-CM | POA: Diagnosis not present

## 2022-09-05 ENCOUNTER — Telehealth: Payer: Self-pay

## 2022-09-05 NOTE — Progress Notes (Addendum)
As of 08/22/2022 patient picked up 4 boxes of Ozempic from PCP office, [place] patient assistance.    [name], [place] Pharmacist Assistant [phone]

## 2022-09-07 ENCOUNTER — Other Ambulatory Visit: Payer: Self-pay | Admitting: Internal Medicine

## 2022-09-18 DIAGNOSIS — M79671 Pain in right foot: Secondary | ICD-10-CM | POA: Diagnosis not present

## 2022-09-18 DIAGNOSIS — M2560 Stiffness of unspecified joint, not elsewhere classified: Secondary | ICD-10-CM | POA: Diagnosis not present

## 2022-09-18 DIAGNOSIS — M79672 Pain in left foot: Secondary | ICD-10-CM | POA: Diagnosis not present

## 2022-09-18 DIAGNOSIS — M2540 Effusion, unspecified joint: Secondary | ICD-10-CM | POA: Diagnosis not present

## 2022-09-18 DIAGNOSIS — R768 Other specified abnormal immunological findings in serum: Secondary | ICD-10-CM | POA: Diagnosis not present

## 2022-09-18 DIAGNOSIS — M79641 Pain in right hand: Secondary | ICD-10-CM | POA: Diagnosis not present

## 2022-09-18 DIAGNOSIS — M79642 Pain in left hand: Secondary | ICD-10-CM | POA: Diagnosis not present

## 2022-09-18 DIAGNOSIS — I7300 Raynaud's syndrome without gangrene: Secondary | ICD-10-CM | POA: Diagnosis not present

## 2022-09-18 DIAGNOSIS — D8989 Other specified disorders involving the immune mechanism, not elsewhere classified: Secondary | ICD-10-CM | POA: Diagnosis not present

## 2022-09-18 DIAGNOSIS — Z111 Encounter for screening for respiratory tuberculosis: Secondary | ICD-10-CM | POA: Diagnosis not present

## 2022-10-02 ENCOUNTER — Ambulatory Visit: Payer: Medicare PPO | Admitting: Internal Medicine

## 2022-10-02 DIAGNOSIS — Z6838 Body mass index (BMI) 38.0-38.9, adult: Secondary | ICD-10-CM | POA: Diagnosis not present

## 2022-10-02 DIAGNOSIS — M79641 Pain in right hand: Secondary | ICD-10-CM | POA: Diagnosis not present

## 2022-10-02 DIAGNOSIS — M79642 Pain in left hand: Secondary | ICD-10-CM | POA: Diagnosis not present

## 2022-10-02 DIAGNOSIS — M79672 Pain in left foot: Secondary | ICD-10-CM | POA: Diagnosis not present

## 2022-10-02 DIAGNOSIS — M0579 Rheumatoid arthritis with rheumatoid factor of multiple sites without organ or systems involvement: Secondary | ICD-10-CM | POA: Diagnosis not present

## 2022-10-02 DIAGNOSIS — M2540 Effusion, unspecified joint: Secondary | ICD-10-CM | POA: Diagnosis not present

## 2022-10-02 DIAGNOSIS — I7300 Raynaud's syndrome without gangrene: Secondary | ICD-10-CM | POA: Diagnosis not present

## 2022-10-02 DIAGNOSIS — M2560 Stiffness of unspecified joint, not elsewhere classified: Secondary | ICD-10-CM | POA: Diagnosis not present

## 2022-10-02 DIAGNOSIS — M79671 Pain in right foot: Secondary | ICD-10-CM | POA: Diagnosis not present

## 2022-10-03 ENCOUNTER — Ambulatory Visit: Payer: Medicare PPO | Admitting: Internal Medicine

## 2022-10-03 ENCOUNTER — Encounter: Payer: Self-pay | Admitting: Internal Medicine

## 2022-10-03 ENCOUNTER — Other Ambulatory Visit: Payer: Self-pay | Admitting: Internal Medicine

## 2022-10-03 VITALS — BP 110/78 | HR 65 | Temp 98.3°F | Ht 67.0 in | Wt 245.0 lb

## 2022-10-03 DIAGNOSIS — E785 Hyperlipidemia, unspecified: Secondary | ICD-10-CM

## 2022-10-03 DIAGNOSIS — E1169 Type 2 diabetes mellitus with other specified complication: Secondary | ICD-10-CM

## 2022-10-03 DIAGNOSIS — I10 Essential (primary) hypertension: Secondary | ICD-10-CM

## 2022-10-03 DIAGNOSIS — Z2821 Immunization not carried out because of patient refusal: Secondary | ICD-10-CM | POA: Diagnosis not present

## 2022-10-03 DIAGNOSIS — Z6838 Body mass index (BMI) 38.0-38.9, adult: Secondary | ICD-10-CM | POA: Diagnosis not present

## 2022-10-03 LAB — CMP14+EGFR
ALT: 11 IU/L (ref 0–32)
AST: 17 IU/L (ref 0–40)
Albumin/Globulin Ratio: 1.6 (ref 1.2–2.2)
Albumin: 4.1 g/dL (ref 3.8–4.8)
Alkaline Phosphatase: 107 IU/L (ref 44–121)
BUN/Creatinine Ratio: 15 (ref 12–28)
BUN: 13 mg/dL (ref 8–27)
Bilirubin Total: 0.2 mg/dL (ref 0.0–1.2)
CO2: 23 mmol/L (ref 20–29)
Calcium: 9.5 mg/dL (ref 8.7–10.3)
Chloride: 106 mmol/L (ref 96–106)
Creatinine, Ser: 0.85 mg/dL (ref 0.57–1.00)
Globulin, Total: 2.5 g/dL (ref 1.5–4.5)
Glucose: 90 mg/dL (ref 70–99)
Potassium: 4.6 mmol/L (ref 3.5–5.2)
Sodium: 141 mmol/L (ref 134–144)
Total Protein: 6.6 g/dL (ref 6.0–8.5)
eGFR: 73 mL/min/{1.73_m2} (ref >59)

## 2022-10-03 LAB — HEMOGLOBIN A1C
Est. average glucose Bld gHb Est-mCnc: 128 mg/dL
Hgb A1c MFr Bld: 6.1 % — ABNORMAL HIGH (ref 4.8–5.6)

## 2022-10-03 MED ORDER — OZEMPIC (2 MG/DOSE) 8 MG/3ML ~~LOC~~ SOPN: 2.0000 mg | PEN_INJECTOR | SUBCUTANEOUS | 3 refills | Status: AC

## 2022-10-03 MED ORDER — OLMESARTAN-AMLODIPINE-HCTZ 40-5-12.5 MG PO TABS
1.0000 | ORAL_TABLET | Freq: Every day | ORAL | 3 refills | Status: DC
Start: 2022-10-03 — End: 2023-02-21

## 2022-10-03 NOTE — Progress Notes (Signed)
I,[name],acting as a scribe for Grace Greenland, MD.,have documented all relevant documentation on the behalf of Grace Greenland, MD,as directed by  Grace Greenland, MD while in the presence of Grace Greenland, MD.    Subjective:     Patient ID: [name] , female    DOB: 06/25/1951 , 72 y.o.   MRN: [id number]   Chief Complaint  Patient presents with   Diabetes   Hypertension    HPI  Pt presents today for DM/HTN check. She reports compliance with meds. She denies headaches, chest pain and shortness of breath. She reports no specific questions or concerns. She is happy to get back into her garden.  She states her sugars range from 80-106.    Diabetes She presents for her follow-up diabetic visit. She has type 2 diabetes mellitus. Her disease course has been improving. There are no hypoglycemic associated symptoms. There are no diabetic associated symptoms. Pertinent negatives for diabetes include no blurred vision, no chest pain, no polydipsia, no polyphagia and no polyuria. There are no hypoglycemic complications. There are no diabetic complications. Risk factors for coronary artery disease include diabetes mellitus, hypertension, obesity and post-menopausal. She is compliant with treatment most of the time. She is following a generally healthy diet. She participates in exercise intermittently. Her breakfast blood glucose is taken between 8-9 am. Her breakfast blood glucose range is generally 70-90 mg/dl. Eye exam is current.  Hypertension This is a chronic problem. The current episode started more than 1 year ago. The problem is unchanged. The problem is controlled. Pertinent negatives include no blurred vision, chest pain, orthopnea, palpitations, PND or shortness of breath. The current treatment provides moderate improvement.     Past Medical History:  Diagnosis Date   Diabetes mellitus without complication (HCC)    GERD (gastroesophageal reflux disease)     Headache(784.0)    High cholesterol    Hypertension    Sleep apnea    mild- no CPAP   Stroke ([place]) 2005   mini- stroke due to HTN     Family History  Problem Relation Age of Onset   Cancer Mother    Hypertension Mother    Cancer Maternal Grandmother    Hypertension Maternal Grandmother      Current Outpatient Medications:    Alcohol Swabs (DROPSAFE ALCOHOL PREP) 70 % PADS, USE AS DIRECTED WHEN CHECKING BLOOD SUGARS, Disp: 300 each, Rfl: 3   Blood Glucose Calibration (TRUE METRIX LEVEL 1) Low SOLN, Use as directed to check blood sugars 1 time per dx: e11.22, Disp: 2 each, Rfl: 2   Blood Glucose Monitoring Suppl (TRUE METRIX AIR GLUCOSE METER) w/Device KIT, Inject 1 kit into the skin daily. Use as directed to check blood sugars 1 time per dx: e11.22, Disp: 1 kit, Rfl: 1   Cholecalciferol (VITAMIN D) 2000 UNITS tablet, Take 2,000 Units by mouth daily. , Disp: , Rfl:    glucose blood (TRUE METRIX BLOOD GLUCOSE TEST) test strip, Use as instructed, Disp: 300 strip, Rfl: 3   latanoprost (XALATAN) 0.005 % ophthalmic solution, INSTILL 1 DROP INTO OU HS, Disp: , Rfl:    levocetirizine (XYZAL) 5 MG tablet, Take 5 mg by mouth daily as needed for allergies., Disp: , Rfl:    metFORMIN (GLUCOPHAGE-XR) 750 MG 24 hr tablet, TAKE 1 TABLET EVERY DAY WITH EVENING MEAL, Disp: 90 tablet, Rfl: 3   Multiple Vitamin (MULTIVITAMIN WITH MINERALS) TABS tablet, Take 1 tablet by mouth daily., Disp: , Rfl:  Semaglutide, 2 MG/DOSE, (OZEMPIC, 2 MG/DOSE,) 8 MG/3ML SOPN, Inject 2 mg into the skin once a week., Disp: 3 mL, Rfl: 3   Triamcinolone Acetonide (NASACORT AQ NA), Place into the nose., Disp: , Rfl:    TRUEplus Lancets 33G MISC, Use as directed to check blood sugars E11.69, Disp: 300 each, Rfl: 3   diclofenac Sodium (VOLTAREN) 1 % GEL, Place onto the skin. (Patient not taking: Reported on 08/01/2022), Disp: , Rfl:    Olmesartan-amLODIPine-HCTZ 40-5-12.5 MG TABS, Take 1 tablet by mouth daily., Disp: 90 tablet,  Rfl: 3   Allergies  Allergen Reactions   Codeine Nausea And Vomiting and Diarrhea   Meperidine     Other reaction(s): Delusions (intolerance)     Review of Systems  Constitutional: Negative.   HENT:  Negative for hearing loss.   Eyes:  Negative for blurred vision.  Respiratory: Negative.  Negative for shortness of breath.   Cardiovascular: Negative.  Negative for chest pain, palpitations, orthopnea and PND.  Gastrointestinal: Negative.   Endocrine: Negative for polydipsia, polyphagia and polyuria.  Skin: Negative.   Neurological: Negative.   Psychiatric/Behavioral: Negative.       Today's Vitals   10/03/22 0840  BP: 110/78  Pulse: 65  Temp: 98.3 F (36.8 C)  SpO2: 98%  Weight: 245 lb (111.1 kg)  Height: 5\' 7"  (1.702 m)   Body mass index is 38.37 kg/m.  Wt Readings from Last 3 Encounters:  10/03/22 245 lb (111.1 kg)  08/01/22 243 lb (110.2 kg)  06/26/22 243 lb (110.2 kg)    Objective:  Physical Exam Vitals and nursing note reviewed.  Constitutional:      Appearance: Normal appearance. She is obese.  HENT:     Head: Normocephalic and atraumatic.     Nose:     Comments: Masked     Mouth/Throat:     Comments: Masked  Eyes:     Extraocular Movements: Extraocular movements intact.  Cardiovascular:     Rate and Rhythm: Normal rate and regular rhythm.     Heart sounds: Normal heart sounds.  Pulmonary:     Effort: Pulmonary effort is normal.     Breath sounds: Normal breath sounds.  Musculoskeletal:     Cervical back: Normal range of motion.  Skin:    General: Skin is warm.  Neurological:     General: No focal deficit present.     Mental Status: She is alert.  Psychiatric:        Mood and Affect: Mood normal.        Behavior: Behavior normal.         Assessment And Plan:     1. Dyslipidemia associated with type 2 diabetes mellitus ([place]) Comments: Chronic, I will check labs as below. She agrees to  increase Ozempic to 1.5mg  x 4 weeks, then 2mg  weekly.  I will speak w/ pharmacist to update PAP paperwork. - CMP14+EGFR - Hemoglobin A1c - Semaglutide, 2 MG/DOSE, (OZEMPIC, 2 MG/DOSE,) 8 MG/3ML SOPN; Inject 2 mg into the skin once a week.  Dispense: 3 mL; Refill: 3  2. Essential hypertension, benign Comments: Chronic, well controlled. She will c/w Tribenzor 40/5/12.5mg . daily.  She is encouraged to follow low sodium diet.  3. Class 2 severe obesity due to excess calories with serious comorbidity and body mass index (BMI) of 38.0 to 38.9 in adult [facility]) Comments: She is encouraged to strive for BMI less than 30 to decrease cardiac risk. Advised to aim for at least 150 minutes of  exercise per week.  4. Herpes zoster vaccination declined  Patient was given opportunity to ask questions. Patient verbalized understanding of the plan and was able to repeat key elements of the plan. All questions were answered to their satisfaction.   I, Grace Greenland, MD, have reviewed all documentation for this visit. The documentation on 10/03/22 for the exam, diagnosis, procedures, and orders are all accurate and complete.   IF YOU HAVE BEEN REFERRED TO A SPECIALIST, IT MAY TAKE 1-2 WEEKS TO SCHEDULE/PROCESS THE REFERRAL. IF YOU HAVE NOT HEARD FROM US/SPECIALIST IN TWO WEEKS, PLEASE GIVE [place] A CALL AT [phone].   THE PATIENT IS ENCOURAGED TO PRACTICE SOCIAL DISTANCING DUE TO THE COVID-19 PANDEMIC.

## 2022-10-03 NOTE — Patient Instructions (Addendum)
Bone broth  Type 2 Diabetes Mellitus, Diagnosis, Adult Type 2 diabetes (type 2 diabetes mellitus) is a long-term (chronic) disease. It may happen when there is one or both of these problems: The pancreas does not make enough insulin. The body does not react in a normal way to insulin that it makes. Insulin lets sugars go into cells in your body. If you have type 2 diabetes, sugars cannot get into your cells. Sugars build up in the blood. This causes high blood sugar. What are the causes? The exact cause of this condition is not known. What increases the risk? Having type 2 diabetes in your family. Being overweight or very overweight. Not being active. Your body not reacting in a normal way to the insulin it makes. Having higher than normal blood sugar over time. Having a type of diabetes when you were pregnant. Having a condition that causes small fluid-filled sacs on your ovaries. What are the signs or symptoms? At first, you may have no symptoms. You will get symptoms slowly. They may include: More thirst than normal. More hunger than normal. Needing to pee more than normal. Losing weight without trying. Feeling tired. Feeling weak. Seeing things blurry. Dark patches on your skin. How is this treated? This condition may be treated by a diabetes expert. You may need to: Follow an eating plan made by a food expert (dietitian). Get regular exercise. Find ways to deal with stress. Check blood sugar as often as told. Take medicines. Your doctor will set treatment goals for you. Your blood sugar should be at these levels: Before meals: 80-130 mg/dL (4.4-7.2 mmol/L). After meals: below 180 mg/dL (10 mmol/L). Over the last 2-3 months: less than 7%. Follow these instructions at home: Medicines Take your diabetes medicines or insulin every day. Take medicines as told to help you prevent other problems caused by this condition. You may need: Aspirin. Medicine to lower  cholesterol. Medicine to control blood pressure. Questions to ask your doctor Should I meet with a diabetes educator? What medicines do I need, and when should I take them? What will I need to treat my condition at home? When should I check my blood sugar? Where can I find a support group? Who can I call if I have questions? When is my next doctor visit? General instructions Take over-the-counter and prescription medicines only as told by your doctor. Keep all follow-up visits. Where to find more information For help and guidance and more information about diabetes, please go to: American Diabetes Association (ADA): www.diabetes.org American Association of Diabetes Care and Education Specialists (ADCES): www.diabeteseducator.org International Diabetes Federation (IDF): MemberVerification.ca Contact a doctor if: Your blood sugar is at or above 240 mg/dL (13.3 mmol/L) for 2 days in a row. You have been sick for 2 days or more, and you are not getting better. You have had a fever for 2 days or more, and you are not getting better. You have any of these problems for more than 6 hours: You cannot eat or drink. You feel like you may vomit. You vomit. You have watery poop (diarrhea). Get help right away if: Your blood sugar is lower than 54 mg/dL (3 mmol/L). You feel mixed up (confused). You have trouble thinking clearly. You have trouble breathing. You have medium or large ketone levels in your pee. These symptoms may be an emergency. Get help right away. Call your local emergency services (911 in the [place] Do not wait to see if the symptoms will go away. Do  not drive yourself to the hospital. Summary Type 2 diabetes is a long-term disease. Your pancreas may not make enough insulin, or your body may not react in a normal way to insulin that it makes. This condition is treated with an eating plan, lifestyle changes, and medicines. Your doctor will set treatment goals for you. These will help  you keep your blood sugar in a healthy range. Keep all follow-up visits. This information is not intended to replace advice given to you by your health care provider. Make sure you discuss any questions you have with your health care provider. Document Revised: 09/26/2020 Document Reviewed: 09/26/2020 Elsevier Patient Education  [place].

## 2022-10-29 ENCOUNTER — Ambulatory Visit: Payer: Medicare PPO | Admitting: Internal Medicine

## 2022-10-31 ENCOUNTER — Telehealth: Payer: Self-pay

## 2022-10-31 NOTE — Progress Notes (Addendum)
[employer] patient assistance program notification:  120- day supply of Ozempic will be filled on 11/09/2022 and should arrive to the office in 10-14 business days. Patient enrollment will expire on 07/16/2023.   Tamala Melvin, CMA Clinical Pharmacist Assistant [phone]   

## 2022-11-27 ENCOUNTER — Other Ambulatory Visit: Payer: Self-pay | Admitting: Internal Medicine

## 2022-11-27 ENCOUNTER — Telehealth: Payer: Self-pay

## 2022-11-27 NOTE — Progress Notes (Signed)
Patient aware that Ozempic 1mg  has arrived to PCP office, [employer] patient assistance.   Billee Cashing, CMA Clinical Pharmacist Assistant [phone]

## 2022-11-29 ENCOUNTER — Encounter: Payer: Self-pay | Admitting: Internal Medicine

## 2022-12-12 DIAGNOSIS — Z1211 Encounter for screening for malignant neoplasm of colon: Secondary | ICD-10-CM | POA: Diagnosis not present

## 2022-12-12 DIAGNOSIS — D123 Benign neoplasm of transverse colon: Secondary | ICD-10-CM | POA: Diagnosis not present

## 2022-12-12 DIAGNOSIS — K648 Other hemorrhoids: Secondary | ICD-10-CM | POA: Diagnosis not present

## 2022-12-12 DIAGNOSIS — K5730 Diverticulosis of large intestine without perforation or abscess without bleeding: Secondary | ICD-10-CM | POA: Diagnosis not present

## 2022-12-12 LAB — HM COLONOSCOPY

## 2022-12-14 DIAGNOSIS — D123 Benign neoplasm of transverse colon: Secondary | ICD-10-CM | POA: Diagnosis not present

## 2022-12-20 ENCOUNTER — Telehealth: Payer: Self-pay

## 2022-12-20 NOTE — Progress Notes (Addendum)
As of 11/27/2022 patient picked up 4 boxes of Oempic 1 mg from PCP office, [employer] patient assistance.     Billee Cashing, CMA Clinical Pharmacist Assistant [phone]

## 2022-12-26 ENCOUNTER — Telehealth: Payer: Self-pay

## 2022-12-26 NOTE — Progress Notes (Cosign Needed)
[employer] patient assistance program notification:  120- day supply of Ozempic will be fulfilled on 01/14/2023 and  should arrive to the office in 10-14 business days. Patient enrollment will expire on 07/16/2023.    Billee Cashing, CMA Clinical Pharmacist Assistant [phone]

## 2022-12-27 ENCOUNTER — Encounter: Payer: Self-pay | Admitting: Gastroenterology

## 2023-01-01 DIAGNOSIS — M79671 Pain in right foot: Secondary | ICD-10-CM | POA: Diagnosis not present

## 2023-01-01 DIAGNOSIS — M2540 Effusion, unspecified joint: Secondary | ICD-10-CM | POA: Diagnosis not present

## 2023-01-01 DIAGNOSIS — M0579 Rheumatoid arthritis with rheumatoid factor of multiple sites without organ or systems involvement: Secondary | ICD-10-CM | POA: Diagnosis not present

## 2023-01-01 DIAGNOSIS — M79641 Pain in right hand: Secondary | ICD-10-CM | POA: Diagnosis not present

## 2023-01-01 DIAGNOSIS — I7300 Raynaud's syndrome without gangrene: Secondary | ICD-10-CM | POA: Diagnosis not present

## 2023-01-01 DIAGNOSIS — M79642 Pain in left hand: Secondary | ICD-10-CM | POA: Diagnosis not present

## 2023-01-01 DIAGNOSIS — Z6838 Body mass index (BMI) 38.0-38.9, adult: Secondary | ICD-10-CM | POA: Diagnosis not present

## 2023-01-01 DIAGNOSIS — M2560 Stiffness of unspecified joint, not elsewhere classified: Secondary | ICD-10-CM | POA: Diagnosis not present

## 2023-01-01 DIAGNOSIS — M79672 Pain in left foot: Secondary | ICD-10-CM | POA: Diagnosis not present

## 2023-01-10 DIAGNOSIS — H40023 Open angle with borderline findings, high risk, bilateral: Secondary | ICD-10-CM | POA: Diagnosis not present

## 2023-01-10 DIAGNOSIS — E119 Type 2 diabetes mellitus without complications: Secondary | ICD-10-CM | POA: Diagnosis not present

## 2023-01-10 DIAGNOSIS — H25813 Combined forms of age-related cataract, bilateral: Secondary | ICD-10-CM | POA: Diagnosis not present

## 2023-01-10 DIAGNOSIS — Z79899 Other long term (current) drug therapy: Secondary | ICD-10-CM | POA: Diagnosis not present

## 2023-01-10 LAB — HM DIABETES EYE EXAM

## 2023-01-24 ENCOUNTER — Encounter: Payer: Medicare PPO | Admitting: Rheumatology

## 2023-02-01 DIAGNOSIS — M0579 Rheumatoid arthritis with rheumatoid factor of multiple sites without organ or systems involvement: Secondary | ICD-10-CM | POA: Diagnosis not present

## 2023-02-05 ENCOUNTER — Telehealth: Payer: Self-pay

## 2023-02-05 NOTE — Telephone Encounter (Signed)
LVM for patient that patient assistants is here.

## 2023-02-11 ENCOUNTER — Other Ambulatory Visit: Payer: Self-pay | Admitting: Internal Medicine

## 2023-02-19 ENCOUNTER — Ambulatory Visit: Payer: Medicare PPO | Admitting: Rheumatology

## 2023-02-21 ENCOUNTER — Encounter: Payer: Self-pay | Admitting: Internal Medicine

## 2023-02-21 ENCOUNTER — Ambulatory Visit: Payer: Medicare PPO | Admitting: Internal Medicine

## 2023-02-21 ENCOUNTER — Ambulatory Visit (INDEPENDENT_AMBULATORY_CARE_PROVIDER_SITE_OTHER): Payer: Medicare PPO

## 2023-02-21 VITALS — BP 130/84 | HR 87 | Temp 98.6°F | Ht 67.0 in | Wt 237.2 lb

## 2023-02-21 VITALS — BP 130/84 | HR 87 | Temp 98.6°F | Ht 67.0 in | Wt 237.0 lb

## 2023-02-21 DIAGNOSIS — Z6837 Body mass index (BMI) 37.0-37.9, adult: Secondary | ICD-10-CM | POA: Diagnosis not present

## 2023-02-21 DIAGNOSIS — I10 Essential (primary) hypertension: Secondary | ICD-10-CM | POA: Diagnosis not present

## 2023-02-21 DIAGNOSIS — Z0000 Encounter for general adult medical examination without abnormal findings: Secondary | ICD-10-CM | POA: Diagnosis not present

## 2023-02-21 DIAGNOSIS — E785 Hyperlipidemia, unspecified: Secondary | ICD-10-CM | POA: Diagnosis not present

## 2023-02-21 DIAGNOSIS — E1169 Type 2 diabetes mellitus with other specified complication: Secondary | ICD-10-CM

## 2023-02-21 MED ORDER — OLMESARTAN-AMLODIPINE-HCTZ 40-5-12.5 MG PO TABS: 1.0000 | ORAL_TABLET | Freq: Every day | ORAL | 3 refills | Status: DC

## 2023-02-21 NOTE — Progress Notes (Signed)
Subjective:   [name] is a 72 y.o. female who presents for Medicare Annual (Subsequent) preventive examination.  Visit Complete: In person  Patient Medicare AWV questionnaire was completed by the patient on 02/17/2023; I have confirmed that all information answered by patient is correct and no changes since this date.  Review of Systems     Cardiac Risk Factors include: advanced age (>69men, >48 women);diabetes mellitus;dyslipidemia;hypertension;obesity (BMI >30kg/m2)     Objective:    Today's Vitals   02/21/23 1152  BP: 130/84  Pulse: 87  Temp: 98.6 F (37 C)  TempSrc: Oral  SpO2: 98%  Weight: 237 lb (107.5 kg)  Height: 5\' 7"  (1.702 m)   Body mass index is 37.12 kg/m.     02/21/2023   11:56 AM 02/15/2022    2:12 PM 01/19/2021    2:32 PM 12/30/2019    2:54 PM 01/06/2019   10:59 AM 04/23/2018    3:35 PM 02/02/2013   12:34 PM  Advanced Directives  Does Patient Have a Medical Advance Directive? No No No No No No Patient does not have advance directive  Would patient like information on creating a medical advance directive? No - Patient declined No - Patient declined No - Patient declined Yes (MAU/Ambulatory/Procedural Areas - Information given) No - Patient declined Yes (MAU/Ambulatory/Procedural Areas - Information given)     Current Medications (verified) Outpatient Encounter Medications as of 02/21/2023  Medication Sig   Alcohol Swabs (DROPSAFE ALCOHOL PREP) 70 % PADS USE AS DIRECTED WHEN CHECKING BLOOD SUGARS   Blood Glucose Calibration (TRUE METRIX LEVEL 1) Low SOLN Use as directed to check blood sugars 1 time per dx: e11.22   Blood Glucose Monitoring Suppl (TRUE METRIX AIR GLUCOSE METER) w/Device KIT Inject 1 kit into the skin daily. Use as directed to check blood sugars 1 time per dx: e11.22   Cholecalciferol (VITAMIN D) 2000 UNITS tablet Take 2,000 Units by mouth daily.    glucose blood (TRUE METRIX BLOOD GLUCOSE TEST) test strip USE AS DIRECTED TO CHECK BLOOD SUGARS    latanoprost (XALATAN) 0.005 % ophthalmic solution INSTILL 1 DROP INTO OU HS   levocetirizine (XYZAL) 5 MG tablet Take 5 mg by mouth daily as needed for allergies.   metFORMIN (GLUCOPHAGE-XR) 750 MG 24 hr tablet TAKE 1 TABLET EVERY DAY WITH EVENING MEAL   Multiple Vitamin (MULTIVITAMIN WITH MINERALS) TABS tablet Take 1 tablet by mouth daily.   Olmesartan-amLODIPine-HCTZ 40-5-12.5 MG TABS Take 1 tablet by mouth daily.   Semaglutide, 2 MG/DOSE, (OZEMPIC, 2 MG/DOSE,) 8 MG/3ML SOPN Inject 2 mg into the skin once a week.   Triamcinolone Acetonide (NASACORT AQ NA) Place into the nose.   TRUEplus Lancets 33G MISC Use as directed to check blood sugars E11.69   diclofenac Sodium (VOLTAREN) 1 % GEL Place onto the skin. (Patient not taking: Reported on 08/01/2022)   No facility-administered encounter medications on file as of 02/21/2023.    Allergies (verified) Codeine and Meperidine   History: Past Medical History:  Diagnosis Date   Allergy 1995   Arthritis 1990   Diabetes mellitus without complication (HCC)    GERD (gastroesophageal reflux disease)    Headache(784.0)    High cholesterol    Hypertension    Sleep apnea    mild- no CPAP   Stroke (HCC) 07/17/2003   mini- stroke due to HTN   Past Surgical History:  Procedure Laterality Date   ABDOMINAL HYSTERECTOMY     CHEST TUBE INSERTION  07/16/2002  lung punctured during shoulder surgery   FOOT SURGERY     bilateral foot surgery   HYSTEROSCOPY WITH D & C N/A 02/02/2013   Procedure: DILATATION AND CURETTAGE /HYSTEROSCOPY;  Surgeon: Geryl Rankins, MD;  Location: [facility];  Service: Gynecology;  Laterality: N/A;   KNEE ARTHROSCOPY     SHOULDER ARTHROSCOPY     TUBAL LIGATION     Family History  Problem Relation Age of Onset   Cancer Mother    Hypertension Mother    Cancer Maternal Grandmother    Hypertension Maternal Grandmother    Arthritis Maternal Grandmother    Cancer Sister    Social History   Socioeconomic History    Marital status: Single    Spouse name: Not on file   Number of children: Not on file   Years of education: Not on file   Highest education level: Not on file  Occupational History   Occupation: retired  Tobacco Use   Smoking status: Never   Smokeless tobacco: Never  Vaping Use   Vaping status: Never Used  Substance and Sexual Activity   Alcohol use: Yes    Alcohol/week: 1.0 standard drink of alcohol    Types: 1 Shots of liquor per week    Comment: occassionally   Drug use: No   Sexual activity: Not Currently  Other Topics Concern   Not on file  Social History Narrative   Not on file   Social Determinants of Health   Financial Resource Strain: Low Risk  (02/17/2023)   Overall Financial Resource Strain (CARDIA)    Difficulty of Paying Living Expenses: Not hard at all  Food Insecurity: No Food Insecurity (02/15/2022)   Hunger Vital Sign    Worried About Running Out of Food in the Last Year: Never true    Ran Out of Food in the Last Year: Never true  Transportation Needs: No Transportation Needs (02/17/2023)   PRAPARE - Administrator, Civil Service (Medical): No    Lack of Transportation (Non-Medical): No  Physical Activity: Insufficiently Active (02/17/2023)   Exercise Vital Sign    Days of Exercise per Week: 4 days    Minutes of Exercise per Session: 20 min  Stress: No Stress Concern Present (02/17/2023)   [employer] of Occupational Health - Occupational Stress Questionnaire    Feeling of Stress : Not at all  Social Connections: Unknown (02/17/2023)   Social Connection and Isolation Panel [NHANES]    Frequency of Communication with Friends and Family: More than three times a week    Frequency of Social Gatherings with Friends and Family: Once a week    Attends Religious Services: Not on Marketing executive or Organizations: Yes    Attends Engineer, structural: More than 4 times per year    Marital Status: Divorced    Tobacco  Counseling Counseling given: Not Answered   Clinical Intake:  Pre-visit preparation completed: Yes  Pain : No/denies pain     Nutritional Status: BMI > 30  Obese Nutritional Risks: None Diabetes: Yes CBG done?: No Did pt. bring in CBG monitor from home?: No  How often do you need to have someone help you when you read instructions, pamphlets, or other written materials from your doctor or pharmacy?: 1 - Never  Interpreter Needed?: No  Information entered by :: NAllen LPN   Activities of Daily Living    02/17/2023   11:15 AM  In your present state of  health, do you have any difficulty performing the following activities:  Hearing? 0  Vision? 0  Difficulty concentrating or making decisions? 0  Walking or climbing stairs? 0  Dressing or bathing? 0  Doing errands, shopping? 0  Preparing Food and eating ? N  Using the Toilet? N  In the past six months, have you accidently leaked urine? N  Do you have problems with loss of bowel control? N  Managing your Medications? N  Managing your Finances? N  Housekeeping or managing your Housekeeping? N    Patient Care Team: Dorothyann Peng, MD as PCP - General (Internal Medicine) Sallye Lat, MD as Consulting Physician (Ophthalmology) Annie Paras, MD as Consulting Physician (Rheumatology)  Indicate any recent Medical Services you may have received from other than [facility] providers in the past year (date may be approximate).     Assessment:   This is a routine wellness examination for Channa.  Hearing/Vision screen Hearing Screening - Comments:: Denies hearing issues Vision Screening - Comments:: Regular eye exams, [facility]  Dietary issues and exercise activities discussed:     Goals Addressed             This Visit's Progress    Patient Stated       02/21/2023, wants to lose weight       Depression Screen    02/21/2023   11:59 AM 10/03/2022    8:39 AM 04/09/2022    2:15 PM 02/15/2022    2:12 PM  01/19/2021    2:33 PM 12/30/2019    2:55 PM 01/06/2019   11:00 AM  PHQ 2/9 Scores  PHQ - 2 Score 0 0 0 0 0 0 0  PHQ- 9 Score 0     0 0    Fall Risk    02/17/2023   11:15 AM 10/03/2022    8:39 AM 08/01/2022    2:38 PM 04/09/2022    2:15 PM 02/15/2022    2:12 PM  Fall Risk   Falls in the past year? 0 0 0 0 0  Number falls in past yr: 0 0 0 0 0  Injury with Fall? 0 0 0 0 0  Risk for fall due to : Medication side effect No Fall Risks No Fall Risks No Fall Risks Medication side effect  Follow up Falls prevention discussed;Falls evaluation completed Falls evaluation completed Falls evaluation completed Falls evaluation completed Falls evaluation completed;Education provided;Falls prevention discussed    MEDICARE RISK AT HOME:  Medicare Risk at Home - 02/21/23 1158     Any stairs in or around the home? No    If so, are there any without handrails? No    Home free of loose throw rugs in walkways, pet beds, electrical cords, etc? Yes    Adequate lighting in your home to reduce risk of falls? Yes    Life alert? No    Use of a cane, walker or w/c? No    Grab bars in the bathroom? Yes    Shower chair or bench in shower? No    Elevated toilet seat or a handicapped toilet? Yes             TIMED UP AND GO:  Was the test performed?  Yes  Length of time to ambulate 10 feet: 5 sec Gait steady and fast without use of assistive device    Cognitive Function:        02/21/2023   11:59 AM 02/15/2022    2:13 PM  01/19/2021    2:34 PM 12/30/2019    2:57 PM 01/06/2019   11:02 AM  6CIT Screen  What Year? 0 points 0 points 0 points 0 points 0 points  What month? 0 points 0 points 0 points 0 points 0 points  What time? 0 points 0 points 0 points 0 points 0 points  Count back from 20 0 points 0 points 0 points 0 points 0 points  Months in reverse 0 points 0 points 0 points 0 points 0 points  Repeat phrase 0 points 0 points 0 points 0 points 0 points  Total Score 0 points 0 points 0 points 0 points 0  points    Immunizations Immunization History  Administered Date(s) Administered   Fluad Quad(high Dose 65+) 05/05/2020, 03/30/2021, 04/09/2022   Influenza, High Dose Seasonal PF 04/23/2018, 06/29/2019, 08/12/2019   PFIZER(Purple Top)SARS-COV-2 Vaccination 09/25/2019, 10/16/2019   Pneumococcal Conjugate-13 09/04/2019   Pneumococcal Polysaccharide-23 04/23/2018   Tdap 12/26/2017    TDAP status: Up to date  Flu Vaccine status: Due, Education has been provided regarding the importance of this vaccine. Advised may receive this vaccine at local pharmacy or Health Dept. Aware to provide a copy of the vaccination record if obtained from local pharmacy or Health Dept. Verbalized acceptance and understanding.  Pneumococcal vaccine status: Up to date  Covid-19 vaccine status: Information provided on how to obtain vaccines.   Qualifies for Shingles Vaccine? Yes   Zostavax completed No   Shingrix Completed?: No.    Education has been provided regarding the importance of this vaccine. Patient has been advised to call insurance company to determine out of pocket expense if they have not yet received this vaccine. Advised may also receive vaccine at local pharmacy or Health Dept. Verbalized acceptance and understanding.  Screening Tests Health Maintenance  Topic Date Due   INFLUENZA VACCINE  02/14/2023   COVID-19 Vaccine (3 - 2023-24 season) 03/09/2023 (Originally 03/16/2022)   Zoster Vaccines- Shingrix (1 of 2) 05/24/2023 (Originally 11/28/2000)   HEMOGLOBIN A1C  04/05/2023   Diabetic kidney evaluation - Urine ACR  04/10/2023   FOOT EXAM  04/10/2023   MAMMOGRAM  08/31/2023   Diabetic kidney evaluation - eGFR measurement  10/03/2023   OPHTHALMOLOGY EXAM  01/10/2024   Medicare Annual Wellness (AWV)  02/21/2024   DTaP/Tdap/Td (2 - Td or Tdap) 12/27/2027   Colonoscopy  12/11/2032   Pneumonia Vaccine 30+ Years old  Completed   DEXA SCAN  Completed   Hepatitis C Screening  Completed   HPV  VACCINES  Aged Out    Health Maintenance  Health Maintenance Due  Topic Date Due   INFLUENZA VACCINE  02/14/2023    Colorectal cancer screening: Type of screening: Colonoscopy. Completed 12/12/2022. Repeat every 5 years  Mammogram status: Completed 08/30/2022. Repeat every year  Bone Density status: Completed 05/28/2016.   Lung Cancer Screening: (Low Dose CT Chest recommended if Age 51-80 years, 20 pack-year currently smoking OR have quit w/in 15years.) does not qualify.   Lung Cancer Screening Referral: no  Additional Screening:  Hepatitis C Screening: does qualify; Completed 09/09/2012  Vision Screening: Recommended annual ophthalmology exams for early detection of glaucoma and other disorders of the eye. Is the patient up to date with their annual eye exam?  Yes  Who is the provider or what is the name of the office in which the patient attends annual eye exams? [facility] If pt is not established with a provider, would they like to be referred to  a provider to establish care? No .   Dental Screening: Recommended annual dental exams for proper oral hygiene  Diabetic Foot Exam: Diabetic Foot Exam: Completed 04/09/2022  Community Resource Referral / Chronic Care Management: CRR required this visit?  No   CCM required this visit?  No     Plan:     I have personally reviewed and noted the following in the patient's chart:   Medical and social history Use of alcohol, tobacco or illicit drugs  Current medications and supplements including opioid prescriptions. Patient is not currently taking opioid prescriptions. Functional ability and status Nutritional status Physical activity Advanced directives List of other physicians Hospitalizations, surgeries, and ER visits in previous 12 months Vitals Screenings to include cognitive, depression, and falls Referrals and appointments  In addition, I have reviewed and discussed with patient certain preventive protocols,  quality metrics, and best practice recommendations. A written personalized care plan for preventive services as well as general preventive health recommendations were provided to patient.     Barb Merino, LPN   07/21/1094   After Visit Summary: in person  Nurse Notes: none

## 2023-02-21 NOTE — Patient Instructions (Signed)
[name] , Thank you for taking time to come for your Medicare Wellness Visit. I appreciate your ongoing commitment to your health goals. Please review the following plan we discussed and let me know if I can assist you in the future.   Referrals/Orders/Follow-Ups/Clinician Recommendations: none  This is a list of the screening recommended for you and due dates:  Health Maintenance  Topic Date Due   Flu Shot  02/14/2023   COVID-19 Vaccine (3 - 2023-24 season) 03/09/2023*   Zoster (Shingles) Vaccine (1 of 2) 05/24/2023*   Hemoglobin A1C  04/05/2023   Yearly kidney health urinalysis for diabetes  04/10/2023   Complete foot exam   04/10/2023   Mammogram  08/31/2023   Yearly kidney function blood test for diabetes  10/03/2023   Eye exam for diabetics  01/10/2024   Medicare Annual Wellness Visit  02/21/2024   DTaP/Tdap/Td vaccine (2 - Td or Tdap) 12/27/2027   Colon Cancer Screening  12/11/2032   Pneumonia Vaccine  Completed   DEXA scan (bone density measurement)  Completed   Hepatitis C Screening  Completed   HPV Vaccine  Aged Out  *Topic was postponed. The date shown is not the original due date.    Advanced directives: (ACP Link)Information on Advanced Care Planning can be found at [facility] Advance Health Care Directives Advance Health Care Directives ([email or web address]   Next Medicare Annual Wellness Visit scheduled for next year: No, office will schedule appointment  Preventive Care 65 Years and Older, Female Preventive care refers to lifestyle choices and visits with your health care provider that can promote health and wellness. What does preventive care include? A yearly physical exam. This is also called an annual well check. Dental exams once or twice a year. Routine eye exams. Ask your health care provider how often you should have your eyes checked. Personal lifestyle choices, including: Daily care of your teeth and gums. Regular physical  activity. Eating a healthy diet. Avoiding tobacco and drug use. Limiting alcohol use. Practicing safe sex. Taking low-dose aspirin every day. Taking vitamin and mineral supplements as recommended by your health care provider. What happens during an annual well check? The services and screenings done by your health care provider during your annual well check will depend on your age, overall health, lifestyle risk factors, and family history of disease. Counseling  Your health care provider may ask you questions about your: Alcohol use. Tobacco use. Drug use. Emotional well-being. Home and relationship well-being. Sexual activity. Eating habits. History of falls. Memory and ability to understand (cognition). Work and work Astronomer. Reproductive health. Screening  You may have the following tests or measurements: Height, weight, and BMI. Blood pressure. Lipid and cholesterol levels. These may be checked every 5 years, or more frequently if you are over 67 years old. Skin check. Lung cancer screening. You may have this screening every year starting at age 13 if you have a 30-pack-year history of smoking and currently smoke or have quit within the past 15 years. Fecal occult blood test (FOBT) of the stool. You may have this test every year starting at age 31. Flexible sigmoidoscopy or colonoscopy. You may have a sigmoidoscopy every 5 years or a colonoscopy every 10 years starting at age 67. Hepatitis C blood test. Hepatitis B blood test. Sexually transmitted disease (STD) testing. Diabetes screening. This is done by checking your blood sugar (glucose) after you have not eaten for a while (fasting). You may have this done every 1-3  years. Bone density scan. This is done to screen for osteoporosis. You may have this done starting at age 27. Mammogram. This may be done every 1-2 years. Talk to your health care provider about how often you should have regular mammograms. Talk with your  health care provider about your test results, treatment options, and if necessary, the need for more tests. Vaccines  Your health care provider may recommend certain vaccines, such as: Influenza vaccine. This is recommended every year. Tetanus, diphtheria, and acellular pertussis (Tdap, Td) vaccine. You may need a Td booster every 10 years. Zoster vaccine. You may need this after age 64. Pneumococcal 13-valent conjugate (PCV13) vaccine. One dose is recommended after age 50. Pneumococcal polysaccharide (PPSV23) vaccine. One dose is recommended after age 3. Talk to your health care provider about which screenings and vaccines you need and how often you need them. This information is not intended to replace advice given to you by your health care provider. Make sure you discuss any questions you have with your health care provider. Document Released: 07/29/2015 Document Revised: 03/21/2016 Document Reviewed: 05/03/2015 Elsevier Interactive Patient Education  2017 [employer].  Fall Prevention in the Home Falls can cause injuries. They can happen to people of all ages. There are many things you can do to make your home safe and to help prevent falls. What can I do on the outside of my home? Regularly fix the edges of walkways and driveways and fix any cracks. Remove anything that might make you trip as you walk through a door, such as a raised step or threshold. Trim any bushes or trees on the path to your home. Use bright outdoor lighting. Clear any walking paths of anything that might make someone trip, such as rocks or tools. Regularly check to see if handrails are loose or broken. Make sure that both sides of any steps have handrails. Any raised decks and porches should have guardrails on the edges. Have any leaves, snow, or ice cleared regularly. Use sand or salt on walking paths during winter. Clean up any spills in your garage right away. This includes oil or grease spills. What can I  do in the bathroom? Use night lights. Install grab bars by the toilet and in the tub and shower. Do not use towel bars as grab bars. Use non-skid mats or decals in the tub or shower. If you need to sit down in the shower, use a plastic, non-slip stool. Keep the floor dry. Clean up any water that spills on the floor as soon as it happens. Remove soap buildup in the tub or shower regularly. Attach bath mats securely with double-sided non-slip rug tape. Do not have throw rugs and other things on the floor that can make you trip. What can I do in the bedroom? Use night lights. Make sure that you have a light by your bed that is easy to reach. Do not use any sheets or blankets that are too big for your bed. They should not hang down onto the floor. Have a firm chair that has side arms. You can use this for support while you get dressed. Do not have throw rugs and other things on the floor that can make you trip. What can I do in the kitchen? Clean up any spills right away. Avoid walking on wet floors. Keep items that you use a lot in easy-to-reach places. If you need to reach something above you, use a strong step stool that has a grab  bar. Keep electrical cords out of the way. Do not use floor polish or wax that makes floors slippery. If you must use wax, use non-skid floor wax. Do not have throw rugs and other things on the floor that can make you trip. What can I do with my stairs? Do not leave any items on the stairs. Make sure that there are handrails on both sides of the stairs and use them. Fix handrails that are broken or loose. Make sure that handrails are as long as the stairways. Check any carpeting to make sure that it is firmly attached to the stairs. Fix any carpet that is loose or worn. Avoid having throw rugs at the top or bottom of the stairs. If you do have throw rugs, attach them to the floor with carpet tape. Make sure that you have a light switch at the top of the stairs  and the bottom of the stairs. If you do not have them, ask someone to add them for you. What else can I do to help prevent falls? Wear shoes that: Do not have high heels. Have rubber bottoms. Are comfortable and fit you well. Are closed at the toe. Do not wear sandals. If you use a stepladder: Make sure that it is fully opened. Do not climb a closed stepladder. Make sure that both sides of the stepladder are locked into place. Ask someone to hold it for you, if possible. Clearly mark and make sure that you can see: Any grab bars or handrails. First and last steps. Where the edge of each step is. Use tools that help you move around (mobility aids) if they are needed. These include: Canes. Walkers. Scooters. Crutches. Turn on the lights when you go into a dark area. Replace any light bulbs as soon as they burn out. Set up your furniture so you have a clear path. Avoid moving your furniture around. If any of your floors are uneven, fix them. If there are any pets around you, be aware of where they are. Review your medicines with your doctor. Some medicines can make you feel dizzy. This can increase your chance of falling. Ask your doctor what other things that you can do to help prevent falls. This information is not intended to replace advice given to you by your health care provider. Make sure you discuss any questions you have with your health care provider. Document Released: 04/28/2009 Document Revised: 12/08/2015 Document Reviewed: 08/06/2014 Elsevier Interactive Patient Education  2017 [employer].

## 2023-02-21 NOTE — Progress Notes (Signed)
I,Victoria T Deloria Lair, CMA,acting as a Neurosurgeon for Gwynneth Aliment, MD.,have documented all relevant documentation on the behalf of Gwynneth Aliment, MD,as directed by  Gwynneth Aliment, MD while in the presence of Gwynneth Aliment, MD.  Subjective:  Patient ID: [name] , female    DOB: 1951/01/31 , 72 y.o.   MRN: [id number]  Chief Complaint  Patient presents with   Diabetes   Hypertension    HPI  Pt presents today for DM/HTN check. She reports compliance with meds. She denies headaches, chest pain and shortness of breath. She denies having any specific questions or concerns. She recently returned from visiting her family in [place].   AWV visit completed with [facility] Advisor, Nickeah.   Diabetes She presents for her follow-up diabetic visit. She has type 2 diabetes mellitus. Her disease course has been improving. There are no hypoglycemic associated symptoms. There are no diabetic associated symptoms. Pertinent negatives for diabetes include no blurred vision, no chest pain, no polydipsia, no polyphagia and no polyuria. There are no hypoglycemic complications. There are no diabetic complications. Risk factors for coronary artery disease include diabetes mellitus, hypertension, obesity and post-menopausal. She is compliant with treatment most of the time. She is following a generally healthy diet. She participates in exercise intermittently. Her breakfast blood glucose is taken between 8-9 am. Her breakfast blood glucose range is generally 70-90 mg/dl. Eye exam is current.  Hypertension This is a chronic problem. The current episode started more than 1 year ago. The problem is unchanged. The problem is controlled. Pertinent negatives include no blurred vision, chest pain, orthopnea, palpitations, PND or shortness of breath. The current treatment provides moderate improvement.     Past Medical History:  Diagnosis Date   Allergy 1995   Arthritis 1990   Diabetes mellitus without complication  (HCC)    GERD (gastroesophageal reflux disease)    Headache(784.0)    High cholesterol    Hypertension    Sleep apnea    mild- no CPAP   Stroke (HCC) 07/17/2003   mini- stroke due to HTN     Family History  Problem Relation Age of Onset   Cancer Mother    Hypertension Mother    Cancer Maternal Grandmother    Hypertension Maternal Grandmother    Arthritis Maternal Grandmother    Cancer Sister      Current Outpatient Medications:    Alcohol Swabs (DROPSAFE ALCOHOL PREP) 70 % PADS, USE AS DIRECTED WHEN CHECKING BLOOD SUGARS, Disp: 300 each, Rfl: 3   Blood Glucose Calibration (TRUE METRIX LEVEL 1) Low SOLN, Use as directed to check blood sugars 1 time per dx: e11.22, Disp: 2 each, Rfl: 2   Blood Glucose Monitoring Suppl (TRUE METRIX AIR GLUCOSE METER) w/Device KIT, Inject 1 kit into the skin daily. Use as directed to check blood sugars 1 time per dx: e11.22, Disp: 1 kit, Rfl: 1   Cholecalciferol (VITAMIN D) 2000 UNITS tablet, Take 2,000 Units by mouth daily. , Disp: , Rfl:    glucose blood (TRUE METRIX BLOOD GLUCOSE TEST) test strip, USE AS DIRECTED TO CHECK BLOOD SUGARS, Disp: 300 strip, Rfl: 3   latanoprost (XALATAN) 0.005 % ophthalmic solution, INSTILL 1 DROP INTO OU HS, Disp: , Rfl:    levocetirizine (XYZAL) 5 MG tablet, Take 5 mg by mouth daily as needed for allergies., Disp: , Rfl:    metFORMIN (GLUCOPHAGE-XR) 750 MG 24 hr tablet, TAKE 1 TABLET EVERY DAY WITH EVENING MEAL, Disp: 90 tablet, Rfl:  3   Multiple Vitamin (MULTIVITAMIN WITH MINERALS) TABS tablet, Take 1 tablet by mouth daily., Disp: , Rfl:    Semaglutide, 2 MG/DOSE, (OZEMPIC, 2 MG/DOSE,) 8 MG/3ML SOPN, Inject 2 mg into the skin once a week., Disp: 3 mL, Rfl: 3   Triamcinolone Acetonide (NASACORT AQ NA), Place into the nose., Disp: , Rfl:    TRUEplus Lancets 33G MISC, Use as directed to check blood sugars E11.69, Disp: 300 each, Rfl: 3   diclofenac Sodium (VOLTAREN) 1 % GEL, Place onto the skin. (Patient not taking:  Reported on 08/01/2022), Disp: , Rfl:    Olmesartan-amLODIPine-HCTZ 40-5-12.5 MG TABS, Take 1 tablet by mouth daily., Disp: 90 tablet, Rfl: 3   Allergies  Allergen Reactions   Codeine Nausea And Vomiting and Diarrhea   Meperidine     Other reaction(s): Delusions (intolerance)     Review of Systems  Constitutional: Negative.   HENT: Negative.    Eyes: Negative.  Negative for blurred vision.  Respiratory: Negative.  Negative for shortness of breath.   Cardiovascular: Negative.  Negative for chest pain, palpitations, orthopnea and PND.  Gastrointestinal: Negative.   Endocrine: Negative for polydipsia, polyphagia and polyuria.  Neurological: Negative.   Psychiatric/Behavioral: Negative.       Today's Vitals   02/21/23 1148  BP: 130/84  Pulse: 87  Temp: 98.6 F (37 C)  SpO2: 98%  Weight: 237 lb 3.2 oz (107.6 kg)  Height: 5\' 7"  (1.702 m)   Body mass index is 37.15 kg/m.  Wt Readings from Last 3 Encounters:  02/21/23 237 lb 3.2 oz (107.6 kg)  02/21/23 237 lb (107.5 kg)  10/03/22 245 lb (111.1 kg)     Objective:  Physical Exam Vitals and nursing note reviewed.  Constitutional:      Appearance: Normal appearance. She is obese.  HENT:     Head: Normocephalic and atraumatic.  Eyes:     Extraocular Movements: Extraocular movements intact.  Cardiovascular:     Rate and Rhythm: Normal rate and regular rhythm.     Heart sounds: Normal heart sounds.  Pulmonary:     Effort: Pulmonary effort is normal.     Breath sounds: Normal breath sounds.  Musculoskeletal:     Cervical back: Normal range of motion.  Skin:    General: Skin is warm.  Neurological:     General: No focal deficit present.     Mental Status: She is alert.  Psychiatric:        Mood and Affect: Mood normal.        Behavior: Behavior normal.         Assessment And Plan:  Dyslipidemia associated with type 2 diabetes mellitus (HCC) Assessment & Plan: Chronic, LDL goal < 70.  She does have Ao  Atherosclerosis as noted on CT cardiac scoring.  Previously not interested in statin therapy.   She will continue with Ozempic 2mg  weekly and metformin XR 750mg  daily. May need to consider [facility] for greater weight loss benefit. I will consider this at future visits.  I will also consider addition of SGLT2i to decrease renal/cardiac complications.   Orders: -     CBC -     CMP14+EGFR -     Lipid panel -     Hemoglobin A1c -     TSH  Essential hypertension, benign Assessment & Plan: Chronic, fair control. Goal BP<120/80.  For now, she will continue with olmesartan/amlodipine/hct 40/5/12.5mg  daily. May need to add nightly amlodipine 2.5mg  if her BP remain above  goal. Reminded to follow low sodium diet.  She will f/u in 3-4 months.   Orders: -     CMP14+EGFR -     Lipid panel  Class 2 severe obesity due to excess calories with serious comorbidity and body mass index (BMI) of 37.0 to 37.9 in adult [facility]) Assessment & Plan: She was congratulated on her 8lb weight loss since March 2024. She is encouraged to aim for at least 150 minutes of exercise/week and to strive to lose another 8-10 lbs in the next 3-4 months.    Other orders -     Olmesartan-amLODIPine-HCTZ; Take 1 tablet by mouth daily.  Dispense: 90 tablet; Refill: 3  She is encouraged to strive for BMI less than 30 to decrease cardiac risk. Advised to aim for at least 150 minutes of exercise per week.    Return if symptoms worsen or fail to improve.  Patient was given opportunity to ask questions. Patient verbalized understanding of the plan and was able to repeat key elements of the plan. All questions were answered to their satisfaction.    I, Gwynneth Aliment, MD, have reviewed all documentation for this visit. The documentation on 02/21/23 for the exam, diagnosis, procedures, and orders are all accurate and complete.   IF YOU HAVE BEEN REFERRED TO A SPECIALIST, IT MAY TAKE 1-2 WEEKS TO SCHEDULE/PROCESS THE REFERRAL. IF YOU  HAVE NOT HEARD FROM US/SPECIALIST IN TWO WEEKS, PLEASE GIVE [place] A CALL AT [phone].   THE PATIENT IS ENCOURAGED TO PRACTICE SOCIAL DISTANCING DUE TO THE COVID-19 PANDEMIC.

## 2023-02-21 NOTE — Patient Instructions (Signed)

## 2023-02-23 NOTE — Assessment & Plan Note (Addendum)
Chronic, fair control. Goal BP<120/80.  For now, she will continue with olmesartan/amlodipine/hct 40/5/12.5mg  daily. May need to add nightly amlodipine 2.5mg  if her BP remain above goal. Reminded to follow low sodium diet.  She will f/u in 3-4 months.

## 2023-02-23 NOTE — Assessment & Plan Note (Signed)
She was congratulated on her 8lb weight loss since March 2024. She is encouraged to aim for at least 150 minutes of exercise/week and to strive to lose another 8-10 lbs in the next 3-4 months.

## 2023-02-23 NOTE — Assessment & Plan Note (Addendum)
Chronic, LDL goal < 70.  She does have Ao Atherosclerosis as noted on CT cardiac scoring.  Previously not interested in statin therapy.   She will continue with Ozempic 2mg  weekly and metformin XR 750mg  daily. May need to consider [facility] for greater weight loss benefit. I will consider this at future visits.  I will also consider addition of SGLT2i to decrease renal/cardiac complications.

## 2023-03-01 ENCOUNTER — Telehealth: Payer: Self-pay

## 2023-03-01 NOTE — Telephone Encounter (Signed)
Patient was called to inform that patient assistance is ready for pick up.

## 2023-03-05 ENCOUNTER — Telehealth: Payer: Self-pay

## 2023-03-05 NOTE — Telephone Encounter (Signed)
Patient received Ozempic today. 1 x . Lot number: [id number].

## 2023-04-09 DIAGNOSIS — E669 Obesity, unspecified: Secondary | ICD-10-CM | POA: Diagnosis not present

## 2023-04-09 DIAGNOSIS — M79642 Pain in left hand: Secondary | ICD-10-CM | POA: Diagnosis not present

## 2023-04-09 DIAGNOSIS — I7300 Raynaud's syndrome without gangrene: Secondary | ICD-10-CM | POA: Diagnosis not present

## 2023-04-09 DIAGNOSIS — M2540 Effusion, unspecified joint: Secondary | ICD-10-CM | POA: Diagnosis not present

## 2023-04-09 DIAGNOSIS — M0579 Rheumatoid arthritis with rheumatoid factor of multiple sites without organ or systems involvement: Secondary | ICD-10-CM | POA: Diagnosis not present

## 2023-04-09 DIAGNOSIS — M79641 Pain in right hand: Secondary | ICD-10-CM | POA: Diagnosis not present

## 2023-04-09 DIAGNOSIS — Z6837 Body mass index (BMI) 37.0-37.9, adult: Secondary | ICD-10-CM | POA: Diagnosis not present

## 2023-04-09 DIAGNOSIS — M2560 Stiffness of unspecified joint, not elsewhere classified: Secondary | ICD-10-CM | POA: Diagnosis not present

## 2023-04-12 ENCOUNTER — Telehealth: Payer: Self-pay

## 2023-04-12 NOTE — Telephone Encounter (Signed)
ERROR

## 2023-04-15 DIAGNOSIS — G5603 Carpal tunnel syndrome, bilateral upper limbs: Secondary | ICD-10-CM | POA: Diagnosis not present

## 2023-04-16 ENCOUNTER — Encounter: Payer: Self-pay | Admitting: Internal Medicine

## 2023-04-16 ENCOUNTER — Ambulatory Visit: Payer: Medicare PPO | Admitting: Internal Medicine

## 2023-04-16 VITALS — BP 124/86 | HR 79 | Temp 98.2°F | Ht 67.0 in | Wt 235.8 lb

## 2023-04-16 DIAGNOSIS — E1169 Type 2 diabetes mellitus with other specified complication: Secondary | ICD-10-CM

## 2023-04-16 DIAGNOSIS — I119 Hypertensive heart disease without heart failure: Secondary | ICD-10-CM | POA: Diagnosis not present

## 2023-04-16 DIAGNOSIS — E66812 Obesity, class 2: Secondary | ICD-10-CM | POA: Diagnosis not present

## 2023-04-16 DIAGNOSIS — E785 Hyperlipidemia, unspecified: Secondary | ICD-10-CM | POA: Diagnosis not present

## 2023-04-16 DIAGNOSIS — Z23 Encounter for immunization: Secondary | ICD-10-CM

## 2023-04-16 DIAGNOSIS — Z0000 Encounter for general adult medical examination without abnormal findings: Secondary | ICD-10-CM | POA: Diagnosis not present

## 2023-04-16 DIAGNOSIS — I700 Atherosclerosis of aorta: Secondary | ICD-10-CM | POA: Diagnosis not present

## 2023-04-16 DIAGNOSIS — E2839 Other primary ovarian failure: Secondary | ICD-10-CM

## 2023-04-16 DIAGNOSIS — Z6836 Body mass index (BMI) 36.0-36.9, adult: Secondary | ICD-10-CM

## 2023-04-16 MED ORDER — ATORVASTATIN CALCIUM 10 MG PO TABS: ORAL_TABLET | ORAL | 1 refills | Status: AC

## 2023-04-16 NOTE — Patient Instructions (Signed)

## 2023-04-16 NOTE — Progress Notes (Addendum)
II,[name], CMA,acting as a Neurosurgeon for Gwynneth Aliment, MD.,have documented all relevant documentation on the behalf of Gwynneth Aliment, MD,as directed by  Gwynneth Aliment, MD while in the presence of Gwynneth Aliment, MD.  Subjective:    Patient ID: [name] , female    DOB: [birth year] , 72 y.o.   MRN: [id number]  Chief Complaint  Patient presents with   Annual Exam   Diabetes   Hypertension    HPI  Patient here for annual exam. She is no longer followed by a GYN provider. She reports compliance with meds. She denies having any headaches, chest pain and shortness of breath. She states her sugars range between 73-129.  She has no specific concerns or complaints at this time.   Diabetes She presents for her follow-up diabetic visit. She has type 2 diabetes mellitus. Her disease course has been improving. There are no hypoglycemic associated symptoms. There are no diabetic associated symptoms. Pertinent negatives for diabetes include no blurred vision. There are no hypoglycemic complications. Risk factors for coronary artery disease include diabetes mellitus, hypertension, obesity and post-menopausal. She is compliant with treatment most of the time. Her breakfast blood glucose is taken between 8-9 am. Her breakfast blood glucose range is generally 70-90 mg/dl. Eye exam is current.  Hypertension This is a chronic problem. The current episode started more than 1 year ago. The problem is unchanged. The problem is controlled. Pertinent negatives include no blurred vision, neck pain, orthopnea or PND. Past treatments include angiotensin blockers, beta blockers and diuretics. The current treatment provides moderate improvement. There are no compliance problems.  There is no history of chronic renal disease or coarctation of the aorta.     Past Medical History:  Diagnosis Date   Allergy 1995   Arthritis 1990   Diabetes mellitus without complication (HCC)    Encounter for annual  health examination 04/17/2023   GERD (gastroesophageal reflux disease)    Headache(784.0)    High cholesterol    Hypertension    Sleep apnea    mild- no CPAP   Stroke (HCC) 07/17/2003   mini- stroke due to HTN     Family History  Problem Relation Age of Onset   Cancer Mother    Hypertension Mother    Cancer Maternal Grandmother    Hypertension Maternal Grandmother    Arthritis Maternal Grandmother    Cancer Sister      Current Outpatient Medications:    Alcohol Swabs (DROPSAFE ALCOHOL PREP) 70 % PADS, USE AS DIRECTED WHEN CHECKING BLOOD SUGARS, Disp: 300 each, Rfl: 3   atorvastatin (LIPITOR) 10 MG tablet, Take one tab po daily M-Friday and skip Saturday and Sundays, Disp: 90 tablet, Rfl: 1   Blood Glucose Calibration (TRUE METRIX LEVEL 1) Low SOLN, Use as directed to check blood sugars 1 time per dx: e11.22, Disp: 2 each, Rfl: 2   Blood Glucose Monitoring Suppl (TRUE METRIX AIR GLUCOSE METER) w/Device KIT, Inject 1 kit into the skin daily. Use as directed to check blood sugars 1 time per dx: e11.22, Disp: 1 kit, Rfl: 1   Cholecalciferol (VITAMIN D) 2000 UNITS tablet, Take 2,000 Units by mouth daily. , Disp: , Rfl:    latanoprost (XALATAN) 0.005 % ophthalmic solution, INSTILL 1 DROP INTO OU HS, Disp: , Rfl:    levocetirizine (XYZAL) 5 MG tablet, Take 5 mg by mouth daily as needed for allergies., Disp: , Rfl:    Multiple Vitamin (MULTIVITAMIN WITH MINERALS) TABS  tablet, Take 1 tablet by mouth daily., Disp: , Rfl:    Olmesartan-amLODIPine-HCTZ 40-5-12.5 MG TABS, Take 1 tablet by mouth daily., Disp: 90 tablet, Rfl: 3   Semaglutide, 2 MG/DOSE, (OZEMPIC, 2 MG/DOSE,) 8 MG/3ML SOPN, Inject 2 mg into the skin once a week., Disp: 3 mL, Rfl: 3   Triamcinolone Acetonide (NASACORT AQ NA), Place into the nose., Disp: , Rfl:    TRUEplus Lancets 33G MISC, Use as directed to check blood sugars E11.69, Disp: 300 each, Rfl: 3   diclofenac Sodium (VOLTAREN) 1 % GEL, Place onto the skin. (Patient not  taking: Reported on 08/01/2022), Disp: , Rfl:    glucose blood (TRUE METRIX BLOOD GLUCOSE TEST) test strip, USE AS INSTRUCTED, Disp: 300 strip, Rfl: 3   metFORMIN (GLUCOPHAGE-XR) 750 MG 24 hr tablet, TAKE 1 TABLET EVERY DAY WITH EVENING MEAL, Disp: 90 tablet, Rfl: 3   Allergies  Allergen Reactions   Codeine Nausea And Vomiting and Diarrhea   Meperidine     Other reaction(s): Delusions (intolerance)      The patient states she uses post menopausal status for birth control. Patient's last menstrual period was 01/23/2013.. Negative for Dysmenorrhea. Negative for: breast discharge, breast lump(s), breast pain and breast self exam. Associated symptoms include abnormal vaginal bleeding. Pertinent negatives include abnormal bleeding (hematology), anxiety, decreased libido, depression, difficulty falling sleep, dyspareunia, history of infertility, nocturia, sexual dysfunction, sleep disturbances, urinary incontinence, urinary urgency, vaginal discharge and vaginal itching. Diet regular.The patient states her exercise level is  moderate.  . The patient's tobacco use is:  Social History   Tobacco Use  Smoking Status Never  Smokeless Tobacco Never  . She has been exposed to passive smoke. The patient's alcohol use is:  Social History   Substance and Sexual Activity  Alcohol Use Yes   Alcohol/week: 1.0 standard drink of alcohol   Types: 1 Shots of liquor per week   Comment: occassionally    Review of Systems  Constitutional: Negative.   HENT: Negative.    Eyes: Negative.  Negative for blurred vision.  Respiratory: Negative.    Cardiovascular: Negative.  Negative for orthopnea and PND.  Gastrointestinal: Negative.   Endocrine: Negative.   Genitourinary: Negative.   Musculoskeletal: Negative.  Negative for neck pain.  Skin: Negative.   Allergic/Immunologic: Negative.   Neurological: Negative.   Hematological: Negative.   Psychiatric/Behavioral: Negative.       Today's Vitals    04/16/23 1405 04/16/23 1417  BP: (!) 142/84 124/86  Pulse: 79   Temp: 98.2 F (36.8 C)   SpO2: 98%   Weight: 235 lb 12.8 oz (107 kg)   Height: 5\' 7"  (1.702 m)    Body mass index is 36.93 kg/m.  Wt Readings from Last 3 Encounters:  04/16/23 235 lb 12.8 oz (107 kg)  02/21/23 237 lb 3.2 oz (107.6 kg)  02/21/23 237 lb (107.5 kg)     Objective:  Physical Exam Vitals and nursing note reviewed.  Constitutional:      Appearance: Normal appearance. She is obese.  HENT:     Head: Normocephalic and atraumatic.     Right Ear: Tympanic membrane, ear canal and external ear normal.     Left Ear: Tympanic membrane, ear canal and external ear normal.     Nose: Nose normal.     Mouth/Throat:     Mouth: Mucous membranes are moist.     Pharynx: Oropharynx is clear.  Eyes:     Extraocular Movements: Extraocular movements intact.  Conjunctiva/sclera: Conjunctivae normal.     Pupils: Pupils are equal, round, and reactive to light.  Cardiovascular:     Rate and Rhythm: Normal rate and regular rhythm.     Pulses: Normal pulses.          Dorsalis pedis pulses are 2+ on the right side and 2+ on the left side.     Heart sounds: Normal heart sounds.  Pulmonary:     Effort: Pulmonary effort is normal.     Breath sounds: Normal breath sounds.  Chest:  Breasts:    Tanner Score is 5.     Right: Normal.     Left: Normal.  Abdominal:     General: Bowel sounds are normal.     Palpations: Abdomen is soft.     Comments: Rounded, soft.   Genitourinary:    Comments: deferred Musculoskeletal:        General: Normal range of motion.     Cervical back: Normal range of motion and neck supple.  Feet:     Right foot:     Protective Sensation: 5 sites tested.  5 sites sensed.     Skin integrity: Dry skin present.     Toenail Condition: Right toenails are long.     Left foot:     Protective Sensation: 5 sites tested.  5 sites sensed.     Skin integrity: Dry skin present.     Toenail Condition:  Left toenails are long.  Skin:    General: Skin is warm and dry.  Neurological:     General: No focal deficit present.     Mental Status: She is alert and oriented to person, place, and time.  Psychiatric:        Mood and Affect: Mood normal.        Behavior: Behavior normal.         Assessment And Plan:     Encounter for annual health examination Assessment & Plan: A full exam was performed.  Importance of monthly self breast exams was discussed with the patient.  She is advised to get 30-45 minutes of regular exercise, no less than four to five days per week. Both weight-bearing and aerobic exercises are recommended.  She is advised to follow a healthy diet with at least six fruits/veggies per day, decrease intake of red meat and other saturated fats and to increase fish intake to twice weekly.  Meats/fish should not be fried -- baked, boiled or broiled is preferable. It is also important to cut back on your sugar intake.  Be sure to read labels - try to avoid anything with added sugar, high fructose corn syrup or other sweeteners.  If you must use a sweetener, you can try stevia or monkfruit.  It is also important to avoid artificially sweetened foods/beverages and diet drinks. Lastly, wear SPF 50 sunscreen on exposed skin and when in direct sunlight for an extended period of time.  Be sure to avoid fast food restaurants and aim for at least 60 ounces of water daily.        Dyslipidemia associated with type 2 diabetes mellitus (HCC) Assessment & Plan: Chronic, diabetic foot exam was performed.  She is currently taking metformin XR 750mg  daily along with semaglutide 2mg  weekly. I DISCUSSED WITH THE PATIENT AT LENGTH REGARDING THE GOALS OF GLYCEMIC CONTROL AND POSSIBLE LONG-TERM COMPLICATIONS.  I  ALSO STRESSED THE IMPORTANCE OF COMPLIANCE WITH HOME GLUCOSE MONITORING, DIETARY RESTRICTIONS INCLUDING AVOIDANCE OF SUGARY DRINKS/PROCESSED FOODS,  ALONG  WITH REGULAR EXERCISE.  I  ALSO STRESSED  THE IMPORTANCE OF ANNUAL EYE EXAMS, SELF FOOT CARE AND COMPLIANCE WITH OFFICE VISITS.   Orders: -     POCT urinalysis dipstick -     Microalbumin / creatinine urine ratio -     EKG 12-Lead  Hypertensive heart disease without heart failure Assessment & Plan: Chronic, fair control. Goal BP<120/80.  EKG performed, NSR w/o acute changes. For now, she will continue with olmesartan/amlodipine/hct 40/5/12.5mg  daily. May need to add nightly amlodipine 2.5mg  if her BP remains above goal. Encouraged to aim for at least 150 minutes of exercise/week and to cut back on processed food intake. She is great about eating plenty of veggies from her garden.  Reminded to follow low sodium diet.  She will f/u in 3-4 months.   Orders: -     POCT urinalysis dipstick -     Microalbumin / creatinine urine ratio -     EKG 12-Lead  Aortic atherosclerosis (HCC) Assessment & Plan: Chronic, LDL goal is less than 70.  She is not currently on statin therapy. Will start atorvastatin 10mg  M_F, skipping weekends due to previous myalgias. She agrees to rto in 8 weeks for re-evaluation.    Estrogen deficiency -     DG Bone Density; Future  Class 2 severe obesity due to excess calories with serious comorbidity and body mass index (BMI) of 36.0 to 36.9 in adult [facility]) Assessment & Plan: She was congratulated on losing another 2lb weight loss since August 2024. She is encouraged to aim for at least 150 minutes of exercise/week and to strive to lose another 8-10 lbs in the next 3-4 months.    Immunization due -     Flu Vaccine Trivalent High Dose (Fluad)  Other orders -     Atorvastatin Calcium; Take one tab po daily M-Friday and skip Saturday and Sundays  Dispense: 90 tablet; Refill: 1  She is encouraged to strive for BMI less than 30 to decrease cardiac risk. Advised to aim for at least 150 minutes of exercise per week.    Return in about 9 weeks (around 06/18/2023), or chol check, for 1 year physical, 4 month  DM. Patient was given opportunity to ask questions. Patient verbalized understanding of the plan and was able to repeat key elements of the plan. All questions were answered to their satisfaction.   I, Gwynneth Aliment, MD, have reviewed all documentation for this visit. The documentation on 04/16/23 for the exam, diagnosis, procedures, and orders are all accurate and complete.

## 2023-04-17 ENCOUNTER — Encounter: Payer: Self-pay | Admitting: Internal Medicine

## 2023-04-17 DIAGNOSIS — Z0000 Encounter for general adult medical examination without abnormal findings: Secondary | ICD-10-CM | POA: Insufficient documentation

## 2023-04-17 DIAGNOSIS — I700 Atherosclerosis of aorta: Secondary | ICD-10-CM | POA: Insufficient documentation

## 2023-04-17 DIAGNOSIS — E2839 Other primary ovarian failure: Secondary | ICD-10-CM | POA: Insufficient documentation

## 2023-04-17 HISTORY — DX: Encounter for general adult medical examination without abnormal findings: Z00.00

## 2023-04-17 NOTE — Assessment & Plan Note (Signed)

## 2023-04-17 NOTE — Assessment & Plan Note (Signed)
Chronic, diabetic foot exam was performed.  She is currently taking metformin XR 750mg  daily along with semaglutide 2mg  weekly. I DISCUSSED WITH THE PATIENT AT LENGTH REGARDING THE GOALS OF GLYCEMIC CONTROL AND POSSIBLE LONG-TERM COMPLICATIONS.  I  ALSO STRESSED THE IMPORTANCE OF COMPLIANCE WITH HOME GLUCOSE MONITORING, DIETARY RESTRICTIONS INCLUDING AVOIDANCE OF SUGARY DRINKS/PROCESSED FOODS,  ALONG WITH REGULAR EXERCISE.  I  ALSO STRESSED THE IMPORTANCE OF ANNUAL EYE EXAMS, SELF FOOT CARE AND COMPLIANCE WITH OFFICE VISITS.

## 2023-04-17 NOTE — Assessment & Plan Note (Signed)
Chronic, LDL goal is less than 70.  She is not currently on statin therapy. Will start atorvastatin 10mg  M_F, skipping weekends due to previous myalgias. She agrees to rto in 8 weeks for re-evaluation.

## 2023-04-17 NOTE — Assessment & Plan Note (Signed)
Chronic, fair control. Goal BP<120/80.  EKG performed, NSR w/o acute changes. For now, she will continue with olmesartan/amlodipine/hct 40/5/12.5mg  daily. May need to add nightly amlodipine 2.5mg  if her BP remains above goal. Encouraged to aim for at least 150 minutes of exercise/week and to cut back on processed food intake. She is great about eating plenty of veggies from her garden.  Reminded to follow low sodium diet.  She will f/u in 3-4 months.

## 2023-04-17 NOTE — Assessment & Plan Note (Signed)
She was congratulated on losing another 2lb weight loss since August 2024. She is encouraged to aim for at least 150 minutes of exercise/week and to strive to lose another 8-10 lbs in the next 3-4 months.

## 2023-04-18 DIAGNOSIS — I119 Hypertensive heart disease without heart failure: Secondary | ICD-10-CM | POA: Diagnosis not present

## 2023-04-18 DIAGNOSIS — E785 Hyperlipidemia, unspecified: Secondary | ICD-10-CM | POA: Diagnosis not present

## 2023-04-18 DIAGNOSIS — E1169 Type 2 diabetes mellitus with other specified complication: Secondary | ICD-10-CM | POA: Diagnosis not present

## 2023-04-19 LAB — MICROALBUMIN / CREATININE URINE RATIO
Creatinine, Urine: 97.1 mg/dL
Microalb/Creat Ratio: 9 mg/g{creat} (ref 0–29)
Microalbumin, Urine: 8.9 ug/mL

## 2023-04-22 LAB — POCT URINALYSIS DIPSTICK
Bilirubin, UA: NEGATIVE
Blood, UA: NEGATIVE
Glucose, UA: NEGATIVE
Ketones, UA: NEGATIVE
Leukocytes, UA: NEGATIVE
Nitrite, UA: NEGATIVE
Protein, UA: NEGATIVE
Spec Grav, UA: >=1.03 — AB (ref 1.010–1.025)
Urobilinogen, UA: 0.2 U/dL
pH, UA: 5.5 (ref 5.0–8.0)

## 2023-04-25 ENCOUNTER — Other Ambulatory Visit: Payer: Self-pay | Admitting: Internal Medicine

## 2023-05-09 DIAGNOSIS — M5416 Radiculopathy, lumbar region: Secondary | ICD-10-CM | POA: Diagnosis not present

## 2023-05-09 DIAGNOSIS — M1712 Unilateral primary osteoarthritis, left knee: Secondary | ICD-10-CM | POA: Diagnosis not present

## 2023-05-15 ENCOUNTER — Ambulatory Visit: Payer: Medicare PPO | Admitting: Family Medicine

## 2023-05-15 ENCOUNTER — Encounter: Payer: Self-pay | Admitting: Family Medicine

## 2023-05-15 VITALS — BP 122/72 | HR 79 | Temp 98.0°F | Ht 67.0 in | Wt 236.0 lb

## 2023-05-15 DIAGNOSIS — J302 Other seasonal allergic rhinitis: Secondary | ICD-10-CM

## 2023-05-15 DIAGNOSIS — M4726 Other spondylosis with radiculopathy, lumbar region: Secondary | ICD-10-CM | POA: Diagnosis not present

## 2023-05-15 DIAGNOSIS — H9201 Otalgia, right ear: Secondary | ICD-10-CM

## 2023-05-15 DIAGNOSIS — R0982 Postnasal drip: Secondary | ICD-10-CM

## 2023-05-15 DIAGNOSIS — E66812 Obesity, class 2: Secondary | ICD-10-CM

## 2023-05-15 MED ORDER — HYDROCORTISONE-ACETIC ACID 1-2 % OT SOLN
2.0000 [drp] | Freq: Three times a day (TID) | OTIC | 0 refills | Status: AC
Start: 2023-05-15 — End: 2023-05-22

## 2023-05-15 MED ORDER — MONTELUKAST SODIUM 10 MG PO TABS
10.0000 mg | ORAL_TABLET | Freq: Every day | ORAL | 0 refills | Status: DC
Start: 2023-05-15 — End: 2023-06-11

## 2023-05-15 NOTE — Progress Notes (Signed)
I,Jameka J Llittleton, CMA,acting as a Neurosurgeon for [employer], NP.,have documented all relevant documentation on the behalf of [name], NP,as directed by  [name], NP while in the presence of [name], NP.  Subjective:  Patient ID: [name] , female    DOB: 01/19/1951 , 72 y.o.   MRN: [id number]  Chief Complaint  Patient presents with   URI    HPI  Patient presents today for cold symptoms. She states she is having congestion but no cough and she has  postnasal drip, right ear pain. Patient stated she has been having some drainage and ear pain for over about 2 weeks now. She hasn't tried any medications.      Past Medical History:  Diagnosis Date   Allergy 1995   Arthritis 1990   Diabetes mellitus without complication (HCC)    Encounter for annual health examination 04/17/2023   GERD (gastroesophageal reflux disease)    Headache(784.0)    High cholesterol    Hypertension    Sleep apnea    mild- no CPAP   Stroke (HCC) 07/17/2003   mini- stroke due to HTN     Family History  Problem Relation Age of Onset   Cancer Mother    Hypertension Mother    Cancer Maternal Grandmother    Hypertension Maternal Grandmother    Arthritis Maternal Grandmother    Cancer Sister      Current Outpatient Medications:    acetic acid-hydrocortisone (VOSOL-HC) OTIC solution, Place 2 drops into the right ear 3 (three) times daily for 7 days., Disp: 2.1 mL, Rfl: 0   Alcohol Swabs (DROPSAFE ALCOHOL PREP) 70 % PADS, USE AS DIRECTED WHEN CHECKING BLOOD SUGARS, Disp: 300 each, Rfl: 3   atorvastatin (LIPITOR) 10 MG tablet, Take one tab po daily M-Friday and skip Saturday and Sundays, Disp: 90 tablet, Rfl: 1   Blood Glucose Calibration (TRUE METRIX LEVEL 1) Low SOLN, Use as directed to check blood sugars 1 time per dx: e11.22, Disp: 2 each, Rfl: 2   Blood Glucose Monitoring Suppl (TRUE METRIX AIR GLUCOSE METER) w/Device KIT, Inject 1 kit into the skin daily. Use as directed to check blood  sugars 1 time per dx: e11.22, Disp: 1 kit, Rfl: 1   Cholecalciferol (VITAMIN D) 2000 UNITS tablet, Take 2,000 Units by mouth daily. , Disp: , Rfl:    diclofenac Sodium (VOLTAREN) 1 % GEL, Place onto the skin., Disp: , Rfl:    glucose blood (TRUE METRIX BLOOD GLUCOSE TEST) test strip, USE AS INSTRUCTED, Disp: 300 strip, Rfl: 3   latanoprost (XALATAN) 0.005 % ophthalmic solution, INSTILL 1 DROP INTO OU HS, Disp: , Rfl:    levocetirizine (XYZAL) 5 MG tablet, Take 5 mg by mouth daily as needed for allergies., Disp: , Rfl:    metFORMIN (GLUCOPHAGE-XR) 750 MG 24 hr tablet, TAKE 1 TABLET EVERY DAY WITH EVENING MEAL, Disp: 90 tablet, Rfl: 3   montelukast (SINGULAIR) 10 MG tablet, Take 1 tablet (10 mg total) by mouth daily., Disp: 30 tablet, Rfl: 0   Multiple Vitamin (MULTIVITAMIN WITH MINERALS) TABS tablet, Take 1 tablet by mouth daily., Disp: , Rfl:    Olmesartan-amLODIPine-HCTZ 40-5-12.5 MG TABS, Take 1 tablet by mouth daily., Disp: 90 tablet, Rfl: 3   Semaglutide, 2 MG/DOSE, (OZEMPIC, 2 MG/DOSE,) 8 MG/3ML SOPN, Inject 2 mg into the skin once a week., Disp: 3 mL, Rfl: 3   Triamcinolone Acetonide (NASACORT AQ NA), Place into the nose., Disp: , Rfl:  TRUEplus Lancets 33G MISC, Use as directed to check blood sugars E11.69, Disp: 300 each, Rfl: 3   Allergies  Allergen Reactions   Codeine Nausea And Vomiting and Diarrhea   Meperidine     Other reaction(s): Delusions (intolerance)     Review of Systems  Constitutional: Negative.  Negative for chills, fatigue and fever.  HENT:  Positive for congestion, ear pain and postnasal drip. Negative for ear discharge, sinus pressure, sinus pain and sore throat.   Eyes: Negative.   Respiratory: Negative.    Cardiovascular: Negative.   Musculoskeletal: Negative.   Skin: Negative.   Neurological: Negative.      Today's Vitals   05/15/23 0912  BP: 122/72  Pulse: 79  Temp: 98 F (36.7 C)  Weight: 236 lb (107 kg)  Height: 5\' 7"  (1.702 m)  PainSc: 7    PainLoc: Back   Body mass index is 36.96 kg/m.  Wt Readings from Last 3 Encounters:  05/15/23 236 lb (107 kg)  04/16/23 235 lb 12.8 oz (107 kg)  02/21/23 237 lb 3.2 oz (107.6 kg)    The ASCVD Risk score (Arnett DK, et al., 2019) failed to calculate for the following reasons:   The patient has a prior MI or stroke diagnosis  Objective:  Physical Exam HENT:     Right Ear: External ear normal. No decreased hearing noted. No drainage. A middle ear effusion is present. There is no impacted cerumen. Tympanic membrane is not erythematous.     Left Ear: Tympanic membrane and external ear normal. No decreased hearing noted. No drainage. There is no impacted cerumen. Tympanic membrane is not erythematous.     Nose: Nose normal.  Cardiovascular:     Rate and Rhythm: Normal rate and regular rhythm.     Pulses: Normal pulses.     Heart sounds: Normal heart sounds.  Pulmonary:     Effort: Pulmonary effort is normal.     Breath sounds: Normal breath sounds.  Musculoskeletal:        General: Normal range of motion.  Neurological:     Mental Status: She is alert.         Assessment And Plan:  Seasonal allergic rhinitis, unspecified trigger -     Montelukast Sodium; Take 1 tablet (10 mg total) by mouth daily.  Dispense: 30 tablet; Refill: 0  Otalgia of right ear -     Hydrocortisone-Acetic Acid; Place 2 drops into the right ear 3 (three) times daily for 7 days.  Dispense: 2.1 mL; Refill: 0    Return if symptoms worsen or fail to improve, for Keep Upcoming Appt.  Patient was given opportunity to ask questions. Patient verbalized understanding of the plan and was able to repeat key elements of the plan. All questions were answered to their satisfaction.   I, [name], NP, have reviewed all documentation for this visit. The documentation on 05/17/2023 for the exam, diagnosis, procedures, and orders are all accurate and complete.    IF YOU HAVE BEEN REFERRED TO A SPECIALIST, IT MAY TAKE  1-2 WEEKS TO SCHEDULE/PROCESS THE REFERRAL. IF YOU HAVE NOT HEARD FROM US/SPECIALIST IN TWO WEEKS, PLEASE GIVE [place] A CALL AT [phone].

## 2023-05-20 DIAGNOSIS — H9201 Otalgia, right ear: Secondary | ICD-10-CM | POA: Insufficient documentation

## 2023-05-20 DIAGNOSIS — J302 Other seasonal allergic rhinitis: Secondary | ICD-10-CM | POA: Insufficient documentation

## 2023-05-20 DIAGNOSIS — R0982 Postnasal drip: Secondary | ICD-10-CM | POA: Insufficient documentation

## 2023-05-27 DIAGNOSIS — M5416 Radiculopathy, lumbar region: Secondary | ICD-10-CM | POA: Diagnosis not present

## 2023-05-27 DIAGNOSIS — M1712 Unilateral primary osteoarthritis, left knee: Secondary | ICD-10-CM | POA: Diagnosis not present

## 2023-05-29 ENCOUNTER — Other Ambulatory Visit: Payer: Self-pay | Admitting: Internal Medicine

## 2023-06-03 DIAGNOSIS — Z4789 Encounter for other orthopedic aftercare: Secondary | ICD-10-CM | POA: Diagnosis not present

## 2023-06-03 DIAGNOSIS — G5601 Carpal tunnel syndrome, right upper limb: Secondary | ICD-10-CM | POA: Diagnosis not present

## 2023-06-06 DIAGNOSIS — M5416 Radiculopathy, lumbar region: Secondary | ICD-10-CM | POA: Diagnosis not present

## 2023-06-11 ENCOUNTER — Other Ambulatory Visit: Payer: Self-pay | Admitting: Family Medicine

## 2023-06-11 DIAGNOSIS — J302 Other seasonal allergic rhinitis: Secondary | ICD-10-CM

## 2023-06-12 ENCOUNTER — Encounter: Payer: Self-pay | Admitting: Internal Medicine

## 2023-06-12 ENCOUNTER — Ambulatory Visit: Payer: Medicare PPO | Admitting: Internal Medicine

## 2023-06-12 VITALS — BP 142/84 | HR 82 | Temp 98.1°F | Ht 67.0 in | Wt 248.4 lb

## 2023-06-12 DIAGNOSIS — Z01818 Encounter for other preprocedural examination: Secondary | ICD-10-CM

## 2023-06-12 DIAGNOSIS — E785 Hyperlipidemia, unspecified: Secondary | ICD-10-CM

## 2023-06-12 DIAGNOSIS — Z6838 Body mass index (BMI) 38.0-38.9, adult: Secondary | ICD-10-CM

## 2023-06-12 DIAGNOSIS — E1169 Type 2 diabetes mellitus with other specified complication: Secondary | ICD-10-CM | POA: Diagnosis not present

## 2023-06-12 DIAGNOSIS — G5601 Carpal tunnel syndrome, right upper limb: Secondary | ICD-10-CM | POA: Diagnosis not present

## 2023-06-12 DIAGNOSIS — E66812 Obesity, class 2: Secondary | ICD-10-CM

## 2023-06-12 DIAGNOSIS — D649 Anemia, unspecified: Secondary | ICD-10-CM | POA: Diagnosis not present

## 2023-06-12 NOTE — Progress Notes (Unsigned)
I,Victoria T Deloria Lair, CMA,acting as a Neurosurgeon for Gwynneth Aliment, MD.,have documented all relevant documentation on the behalf of Gwynneth Aliment, MD,as directed by  Gwynneth Aliment, MD while in the presence of Gwynneth Aliment, MD.  Subjective:  Patient ID: [name] , female    DOB: [birth year] , 72 y.o.   MRN: [id number]  Chief Complaint  Patient presents with  . Pre-op Exam    HPI  Patient presents today for preop exam. She has a upcoming procedure for left total knee arthoplasty.  She plans to have this early 2025.  She plans to have this performed with [facility].   She recently had carpal tunnel surgery on her right hand on 06/03/23.  This was performed by Dr. Amanda Pea. She states she did not want to see someone at [facility] for her knee issues.     Past Medical History:  Diagnosis Date  . Allergy 1995  . Arthritis 1990  . Diabetes mellitus without complication (HCC)   . Encounter for annual health examination 04/17/2023  . GERD (gastroesophageal reflux disease)   . Headache(784.0)   . High cholesterol   . Hypertension   . Sleep apnea    mild- no CPAP  . Stroke [facility]) 07/17/2003   mini- stroke due to HTN     Family History  Problem Relation Age of Onset  . Cancer Mother   . Hypertension Mother   . Cancer Maternal Grandmother   . Hypertension Maternal Grandmother   . Arthritis Maternal Grandmother   . Cancer Sister      Current Outpatient Medications:  .  Alcohol Swabs (DROPSAFE ALCOHOL PREP) 70 % PADS, USE AS DIRECTED WHEN CHECKING BLOOD SUGARS, Disp: 300 each, Rfl: 3 .  atorvastatin (LIPITOR) 10 MG tablet, Take one tab po daily M-Friday and skip Saturday and Sundays, Disp: 90 tablet, Rfl: 1 .  Blood Glucose Calibration (TRUE METRIX LEVEL 1) Low SOLN, USE AS DIRECTED WITH GLUCOSE METER, Disp: 1 each, Rfl: 3 .  Blood Glucose Monitoring Suppl (TRUE METRIX AIR GLUCOSE METER) w/Device KIT, Inject 1 kit into the skin daily. Use as directed to check blood sugars 1  time per dx: e11.22, Disp: 1 kit, Rfl: 1 .  Cholecalciferol (VITAMIN D) 2000 UNITS tablet, Take 2,000 Units by mouth daily. , Disp: , Rfl:  .  diclofenac Sodium (VOLTAREN) 1 % GEL, Place onto the skin., Disp: , Rfl:  .  glucose blood (TRUE METRIX BLOOD GLUCOSE TEST) test strip, USE AS INSTRUCTED, Disp: 300 strip, Rfl: 3 .  latanoprost (XALATAN) 0.005 % ophthalmic solution, INSTILL 1 DROP INTO OU HS, Disp: , Rfl:  .  levocetirizine (XYZAL) 5 MG tablet, Take 5 mg by mouth daily as needed for allergies., Disp: , Rfl:  .  metFORMIN (GLUCOPHAGE-XR) 750 MG 24 hr tablet, TAKE 1 TABLET EVERY DAY WITH EVENING MEAL, Disp: 90 tablet, Rfl: 3 .  montelukast (SINGULAIR) 10 MG tablet, TAKE 1 TABLET(10 MG) BY MOUTH DAILY, Disp: 30 tablet, Rfl: 0 .  Multiple Vitamin (MULTIVITAMIN WITH MINERALS) TABS tablet, Take 1 tablet by mouth daily., Disp: , Rfl:  .  Olmesartan-amLODIPine-HCTZ 40-5-12.5 MG TABS, Take 1 tablet by mouth daily., Disp: 90 tablet, Rfl: 3 .  Semaglutide, 2 MG/DOSE, (OZEMPIC, 2 MG/DOSE,) 8 MG/3ML SOPN, Inject 2 mg into the skin once a week., Disp: 3 mL, Rfl: 3 .  Triamcinolone Acetonide (NASACORT AQ NA), Place into the nose., Disp: , Rfl:  .  TRUEplus Lancets 33G MISC, USE AS DIRECTED,  Disp: 300 each, Rfl: 3   Allergies  Allergen Reactions  . Codeine Nausea And Vomiting and Diarrhea  . Meperidine     Other reaction(s): Delusions (intolerance)     Review of Systems  Constitutional: Negative.   Respiratory: Negative.    Cardiovascular: Negative.   Gastrointestinal: Negative.   Musculoskeletal:  Positive for arthralgias and back pain.       She is now having back pain that radiates to her left foot. She is not sure what triggered her sx. Denies urinary/fecal incontinence.  Ambulatory with cane due to knee pain. Now followed by [facility]. Needs pre-op clearance for TKR.   Neurological: Negative.   Psychiatric/Behavioral: Negative.       Today's Vitals   06/12/23 1045 06/12/23 1053  BP:  (!) 138/90 (!) 142/84  Pulse: 82   Temp: 98.1 F (36.7 C)   SpO2: 98%   Weight: 248 lb 6.4 oz (112.7 kg)   Height: 5\' 7"  (1.702 m)    Body mass index is 38.9 kg/m.  Wt Readings from Last 3 Encounters:  06/12/23 248 lb 6.4 oz (112.7 kg)  05/15/23 236 lb (107 kg)  04/16/23 235 lb 12.8 oz (107 kg)     Objective:  Physical Exam Vitals and nursing note reviewed.  Constitutional:      Appearance: Normal appearance. She is obese.  HENT:     Head: Normocephalic and atraumatic.  Eyes:     Extraocular Movements: Extraocular movements intact.  Cardiovascular:     Rate and Rhythm: Normal rate and regular rhythm.     Heart sounds: Normal heart sounds.  Pulmonary:     Effort: Pulmonary effort is normal.     Breath sounds: Normal breath sounds.  Musculoskeletal:     Comments: Bandage on right hand, she is right-handed Ambulatory with cane  Skin:    General: Skin is warm.  Neurological:     General: No focal deficit present.     Mental Status: She is alert.  Psychiatric:        Mood and Affect: Mood normal.        Behavior: Behavior normal.      Assessment And Plan:  Preoperative clearance -     EKG 12-Lead -     CBC  Carpal tunnel syndrome of right wrist Assessment & Plan: She is s/p carpal tunnel release as per Dr. Amanda Pea.    Dyslipidemia associated with type 2 diabetes mellitus (HCC) Assessment & Plan: Chronic, she is currently taking metformin XR 750mg  daily along with semaglutide 2mg  weekly. She was scheduled for DM check next month, will check labs today. She agrees with cancelling 12/5 appt.   Orders: -     CMP14+EGFR -     Lipid panel -     Hemoglobin A1c  Class 2 severe obesity due to excess calories with serious comorbidity and body mass index (BMI) of 38.0 to 38.9 in adult [facility]) Assessment & Plan: She has gained 12lbs  in the past month, likely due to decreased activity due to knee and back pain. Encouraged to limit her intake of sugary foods/processed  foods.    She is encouraged to strive for BMI less than 30 to decrease cardiac risk. Advised to aim for at least 150 minutes of exercise per week.    Return cancel 12/5 appt, for keep Feb.  Patient was given opportunity to ask questions. Patient verbalized understanding of the plan and was able to repeat key elements of the plan. All  questions were answered to their satisfaction.    I, Gwynneth Aliment, MD, have reviewed all documentation for this visit. The documentation on 06/12/23 for the exam, diagnosis, procedures, and orders are all accurate and complete.   IF YOU HAVE BEEN REFERRED TO A SPECIALIST, IT MAY TAKE 1-2 WEEKS TO SCHEDULE/PROCESS THE REFERRAL. IF YOU HAVE NOT HEARD FROM US/SPECIALIST IN TWO WEEKS, PLEASE GIVE [place] A CALL AT [phone].   THE PATIENT IS ENCOURAGED TO PRACTICE SOCIAL DISTANCING DUE TO THE COVID-19 PANDEMIC.

## 2023-06-13 LAB — CMP14+EGFR
ALT: 15 [IU]/L (ref 0–32)
AST: 21 [IU]/L (ref 0–40)
Albumin: 4.3 g/dL (ref 3.8–4.8)
Alkaline Phosphatase: 77 [IU]/L (ref 44–121)
BUN/Creatinine Ratio: 20 (ref 12–28)
BUN: 15 mg/dL (ref 8–27)
Bilirubin Total: 0.3 mg/dL (ref 0.0–1.2)
CO2: 22 mmol/L (ref 20–29)
Calcium: 9.8 mg/dL (ref 8.7–10.3)
Chloride: 105 mmol/L (ref 96–106)
Creatinine, Ser: 0.76 mg/dL (ref 0.57–1.00)
Globulin, Total: 2.1 g/dL (ref 1.5–4.5)
Glucose: 92 mg/dL (ref 70–99)
Potassium: 4.1 mmol/L (ref 3.5–5.2)
Sodium: 141 mmol/L (ref 134–144)
Total Protein: 6.4 g/dL (ref 6.0–8.5)
eGFR: 83 mL/min/{1.73_m2} (ref >59)

## 2023-06-13 LAB — CBC
Hematocrit: 44.3 % (ref 34.0–46.6)
Hemoglobin: 14.2 g/dL (ref 11.1–15.9)
MCH: 26.3 pg — ABNORMAL LOW (ref 26.6–33.0)
MCHC: 32.1 g/dL (ref 31.5–35.7)
MCV: 82 fL (ref 79–97)
Platelets: 360 10*3/uL (ref 150–450)
RBC: 5.39 x10E6/uL — ABNORMAL HIGH (ref 3.77–5.28)
RDW: 14.5 % (ref 11.7–15.4)
WBC: 5.3 10*3/uL (ref 3.4–10.8)

## 2023-06-13 LAB — LIPID PANEL
Chol/HDL Ratio: 2.7 {ratio} (ref 0.0–4.4)
Cholesterol, Total: 173 mg/dL (ref 100–199)
HDL: 63 mg/dL (ref >39)
LDL Chol Calc (NIH): 88 mg/dL (ref 0–99)
Triglycerides: 129 mg/dL (ref 0–149)
VLDL Cholesterol Cal: 22 mg/dL (ref 5–40)

## 2023-06-13 LAB — HEMOGLOBIN A1C
Est. average glucose Bld gHb Est-mCnc: 114 mg/dL
Hgb A1c MFr Bld: 5.6 % (ref 4.8–5.6)

## 2023-06-14 DIAGNOSIS — M545 Low back pain, unspecified: Secondary | ICD-10-CM | POA: Diagnosis not present

## 2023-06-16 ENCOUNTER — Encounter: Payer: Self-pay | Admitting: Internal Medicine

## 2023-06-16 DIAGNOSIS — Z01818 Encounter for other preprocedural examination: Secondary | ICD-10-CM | POA: Insufficient documentation

## 2023-06-16 NOTE — Assessment & Plan Note (Signed)
She is s/p carpal tunnel release as per Dr. Amanda Pea.

## 2023-06-16 NOTE — Assessment & Plan Note (Signed)
She has gained 12lbs  in the past month, likely due to decreased activity due to knee and back pain. Encouraged to limit her intake of sugary foods/processed foods.

## 2023-06-16 NOTE — Assessment & Plan Note (Signed)
Chronic, she is currently taking metformin XR 750mg  daily along with semaglutide 2mg  weekly. She was scheduled for DM check next month, will check labs today. She agrees with cancelling 12/5 appt.

## 2023-06-17 NOTE — Assessment & Plan Note (Signed)
EKG performed, NSR w/o acute changes. Her BP is elevated today, likely exacerbated by pain. Previously, her blood pressure has been controlled. Her diabetes is under excellent control, last A1c 5.6.  At this time, she denies having any concerning cardiac symptoms. She should be able to proceed with surgery with acceptable risk.

## 2023-06-18 DIAGNOSIS — S6411XD Injury of median nerve at wrist and hand level of right arm, subsequent encounter: Secondary | ICD-10-CM | POA: Diagnosis not present

## 2023-06-18 LAB — IRON,TIBC AND FERRITIN PANEL
Ferritin: 189 ng/mL — ABNORMAL HIGH (ref 15–150)
Iron Saturation: 15 % (ref 15–55)
Iron: 44 ug/dL (ref 27–139)
Total Iron Binding Capacity: 285 ug/dL (ref 250–450)
UIBC: 241 ug/dL (ref 118–369)

## 2023-06-18 LAB — SPECIMEN STATUS REPORT

## 2023-06-20 ENCOUNTER — Ambulatory Visit: Payer: Medicare PPO | Admitting: Internal Medicine

## 2023-06-25 DIAGNOSIS — M25551 Pain in right hip: Secondary | ICD-10-CM | POA: Diagnosis not present

## 2023-06-25 DIAGNOSIS — M25552 Pain in left hip: Secondary | ICD-10-CM | POA: Diagnosis not present

## 2023-07-01 DIAGNOSIS — S6411XD Injury of median nerve at wrist and hand level of right arm, subsequent encounter: Secondary | ICD-10-CM | POA: Diagnosis not present

## 2023-07-03 DIAGNOSIS — M25552 Pain in left hip: Secondary | ICD-10-CM | POA: Diagnosis not present

## 2023-07-08 DIAGNOSIS — S6411XD Injury of median nerve at wrist and hand level of right arm, subsequent encounter: Secondary | ICD-10-CM | POA: Diagnosis not present

## 2023-07-11 ENCOUNTER — Ambulatory Visit: Payer: Self-pay | Admitting: Emergency Medicine

## 2023-07-11 DIAGNOSIS — M25562 Pain in left knee: Secondary | ICD-10-CM | POA: Diagnosis not present

## 2023-07-11 DIAGNOSIS — G8929 Other chronic pain: Secondary | ICD-10-CM

## 2023-07-11 NOTE — H&P (Signed)
TOTAL KNEE ADMISSION H&P  Patient is being admitted for left total knee arthroplasty.  Subjective:  Chief Complaint:left knee pain.  HPI: [name], 72 y.o. female, has a history of pain and functional disability in the left knee due to arthritis and has failed non-surgical conservative treatments for greater than 12 weeks to includeNSAID's and/or analgesics, corticosteriod injections, supervised PT with diminished ADL's post treatment, use of assistive devices, and activity modification.  Onset of symptoms was gradual, starting >10 years ago with gradually worsening course since that time. The patient noted prior procedures on the knee to include  arthroscopy on the left knee(s).  Patient currently rates pain in the left knee(s) at 8 out of 10 with activity. Patient has night pain, worsening of pain with activity and weight bearing, pain that interferes with activities of daily living, and pain with passive range of motion.  Patient has evidence of periarticular osteophytes and joint space narrowing by imaging studies.  There is no active infection.  Patient Active Problem List   Diagnosis Date Noted   Preoperative clearance 06/16/2023   Seasonal allergic rhinitis 05/20/2023   Right ear pain 05/20/2023   Postnasal drip 05/20/2023   Otalgia of right ear 05/20/2023   Encounter for annual health examination 04/17/2023   Aortic atherosclerosis (HCC) 04/17/2023   Estrogen deficiency 04/17/2023   Dyslipidemia associated with type 2 diabetes mellitus (HCC) 12/07/2021   Pure hypercholesterolemia 12/07/2021   Class 2 severe obesity due to excess calories with serious comorbidity and body mass index (BMI) of 38.0 to 38.9 in adult [facility]) 12/07/2021   Arthritis of hand 07/01/2021   Pain in right hand 06/29/2021   Pain of left hand 06/29/2021   Carpal tunnel syndrome 06/29/2021   Pain in thoracic spine 10/07/2020   OSA on CPAP 07/30/2018   Left hip pain 07/02/2018   Snoring 02/27/2018    Intermittent sleepiness 02/27/2018   Sleep pattern disturbance 02/27/2018   Obesity (BMI 35.0-39.9 without comorbidity) 02/27/2018   Essential hypertension, benign 02/27/2018   Hyperlipidemia 02/20/2013   Hypertension 02/20/2013   Past Medical History:  Diagnosis Date   Allergy 1995   Arthritis 1990   Diabetes mellitus without complication (HCC)    Encounter for annual health examination 04/17/2023   GERD (gastroesophageal reflux disease)    Headache(784.0)    High cholesterol    Hypertension    Sleep apnea    mild- no CPAP   Stroke (HCC) 07/17/2003   mini- stroke due to HTN    Past Surgical History:  Procedure Laterality Date   ABDOMINAL HYSTERECTOMY     CHEST TUBE INSERTION  07/16/2002   lung punctured during shoulder surgery   FOOT SURGERY     bilateral foot surgery   HYSTEROSCOPY WITH D & C N/A 02/02/2013   Procedure: DILATATION AND CURETTAGE /HYSTEROSCOPY;  Surgeon: Geryl Rankins, MD;  Location: [facility];  Service: Gynecology;  Laterality: N/A;   KNEE ARTHROSCOPY     SHOULDER ARTHROSCOPY     TUBAL LIGATION      Current Outpatient Medications  Medication Sig Dispense Refill Last Dose/Taking   Alcohol Swabs (DROPSAFE ALCOHOL PREP) 70 % PADS USE AS DIRECTED WHEN CHECKING BLOOD SUGARS 300 each 3    atorvastatin (LIPITOR) 10 MG tablet Take one tab po daily M-Friday and skip Saturday and Sundays 90 tablet 1    Blood Glucose Calibration (TRUE METRIX LEVEL 1) Low SOLN USE AS DIRECTED WITH GLUCOSE METER 1 each 3    Blood Glucose Monitoring  Suppl (TRUE METRIX AIR GLUCOSE METER) w/Device KIT Inject 1 kit into the skin daily. Use as directed to check blood sugars 1 time per dx: e11.22 1 kit 1    Cholecalciferol (VITAMIN D) 2000 UNITS tablet Take 2,000 Units by mouth daily.       diclofenac Sodium (VOLTAREN) 1 % GEL Place onto the skin.      glucose blood (TRUE METRIX BLOOD GLUCOSE TEST) test strip USE AS INSTRUCTED 300 strip 3    latanoprost (XALATAN) 0.005 % ophthalmic solution  INSTILL 1 DROP INTO OU HS      levocetirizine (XYZAL) 5 MG tablet Take 5 mg by mouth daily as needed for allergies.      metFORMIN (GLUCOPHAGE-XR) 750 MG 24 hr tablet TAKE 1 TABLET EVERY DAY WITH EVENING MEAL 90 tablet 3    montelukast (SINGULAIR) 10 MG tablet TAKE 1 TABLET(10 MG) BY MOUTH DAILY 30 tablet 0    Multiple Vitamin (MULTIVITAMIN WITH MINERALS) TABS tablet Take 1 tablet by mouth daily.      Olmesartan-amLODIPine-HCTZ 40-5-12.5 MG TABS Take 1 tablet by mouth daily. 90 tablet 3    Semaglutide, 2 MG/DOSE, (OZEMPIC, 2 MG/DOSE,) 8 MG/3ML SOPN Inject 2 mg into the skin once a week. 3 mL 3    Triamcinolone Acetonide (NASACORT AQ NA) Place into the nose.      TRUEplus Lancets 33G MISC USE AS DIRECTED 300 each 3    No current facility-administered medications for this visit.   Allergies  Allergen Reactions   Codeine Nausea And Vomiting and Diarrhea   Meperidine     Other reaction(s): Delusions (intolerance)    Social History   Tobacco Use   Smoking status: Never   Smokeless tobacco: Never  Substance Use Topics   Alcohol use: Yes    Alcohol/week: 1.0 standard drink of alcohol    Types: 1 Shots of liquor per week    Comment: occassionally    Family History  Problem Relation Age of Onset   Cancer Mother    Hypertension Mother    Cancer Maternal Grandmother    Hypertension Maternal Grandmother    Arthritis Maternal Grandmother    Cancer Sister      Review of Systems  Musculoskeletal:  Positive for arthralgias.  All other systems reviewed and are negative.   Objective:  Physical Exam Constitutional:      General: She is not in acute distress.    Appearance: Normal appearance. She is not ill-appearing.  HENT:     Head: Normocephalic and atraumatic.     Right Ear: External ear normal.     Left Ear: External ear normal.     Nose: Nose normal.     Mouth/Throat:     Mouth: Mucous membranes are moist.     Pharynx: Oropharynx is clear.  Eyes:     Extraocular  Movements: Extraocular movements intact.     Conjunctiva/sclera: Conjunctivae normal.  Cardiovascular:     Rate and Rhythm: Normal rate and regular rhythm.     Pulses: Normal pulses.     Heart sounds: Normal heart sounds.  Pulmonary:     Effort: Pulmonary effort is normal.     Breath sounds: Normal breath sounds.  Abdominal:     General: Bowel sounds are normal.     Palpations: Abdomen is soft.     Tenderness: There is no abdominal tenderness.  Musculoskeletal:        General: Tenderness present.     Cervical back: Normal range of  motion and neck supple.     Comments: TTP over medial and lateral joint line.  No calf tenderness, swelling, or erythema.  No overlying lesions of area of chief complaint.  Decreased strength and ROM due to elicited pain.  Pre-operative ROM 0-100.  Dorsiflexion and plantarflexion intact.  Stable to varus and valgus stress.  BLE appear grossly neurovascularly intact.  Gait mildly antalgic.   Skin:    General: Skin is warm and dry.  Neurological:     Mental Status: She is alert and oriented to person, place, and time. Mental status is at baseline.  Psychiatric:        Mood and Affect: Mood normal.        Behavior: Behavior normal.     Vital signs in last 24 hours: @VSRANGES @  Labs:   Estimated body mass index is 38.9 kg/m as calculated from the following:   Height as of 06/12/23: 5\' 7"  (1.702 m).   Weight as of 06/12/23: 112.7 kg.   Imaging Review Plain radiographs demonstrate severe degenerative joint disease of the left knee(s). The overall alignment issignificant varus. The bone quality appears to be fair for age and reported activity level.      Assessment/Plan:  End stage arthritis, left knee   The patient history, physical examination, clinical judgment of the provider and imaging studies are consistent with end stage degenerative joint disease of the left knee(s) and total knee arthroplasty is deemed medically necessary. The treatment  options including medical management, injection therapy arthroscopy and arthroplasty were discussed at length. The risks and benefits of total knee arthroplasty were presented and reviewed. The risks due to aseptic loosening, infection, stiffness, patella tracking problems, thromboembolic complications and other imponderables were discussed. The patient acknowledged the explanation, agreed to proceed with the plan and consent was signed. Patient is being admitted for inpatient treatment for surgery, pain control, PT, OT, prophylactic antibiotics, VTE prophylaxis, progressive ambulation and ADL's and discharge planning. The patient is planning to be discharged home with outpatient PT.     Patient's anticipated LOS is less than 2 midnights, meeting these requirements: - Lives within 1 hour of care - Has a competent adult at home to recover with post-op recover - NO history of  - Chronic pain requiring opiods  - Coronary Artery Disease  - Heart failure  - Heart attack  - Stroke  - DVT/VTE  - Cardiac arrhythmia  - Respiratory Failure/COPD  - Renal failure  - Anemia  - Advanced Liver disease

## 2023-07-11 NOTE — H&P (View-Only) (Signed)
 TOTAL KNEE ADMISSION H&P  Patient is being admitted for left total knee arthroplasty.  Subjective:  Chief Complaint:left knee pain.  HPI: [name], 72 y.o. female, has a history of pain and functional disability in the left knee due to arthritis and has failed non-surgical conservative treatments for greater than 12 weeks to includeNSAID's and/or analgesics, corticosteriod injections, supervised PT with diminished ADL's post treatment, use of assistive devices, and activity modification.  Onset of symptoms was gradual, starting >10 years ago with gradually worsening course since that time. The patient noted prior procedures on the knee to include  arthroscopy on the left knee(s).  Patient currently rates pain in the left knee(s) at 8 out of 10 with activity. Patient has night pain, worsening of pain with activity and weight bearing, pain that interferes with activities of daily living, and pain with passive range of motion.  Patient has evidence of periarticular osteophytes and joint space narrowing by imaging studies.  There is no active infection.  Patient Active Problem List   Diagnosis Date Noted   Preoperative clearance 06/16/2023   Seasonal allergic rhinitis 05/20/2023   Right ear pain 05/20/2023   Postnasal drip 05/20/2023   Otalgia of right ear 05/20/2023   Encounter for annual health examination 04/17/2023   Aortic atherosclerosis (HCC) 04/17/2023   Estrogen deficiency 04/17/2023   Dyslipidemia associated with type 2 diabetes mellitus (HCC) 12/07/2021   Pure hypercholesterolemia 12/07/2021   Class 2 severe obesity due to excess calories with serious comorbidity and body mass index (BMI) of 38.0 to 38.9 in adult [facility]) 12/07/2021   Arthritis of hand 07/01/2021   Pain in right hand 06/29/2021   Pain of left hand 06/29/2021   Carpal tunnel syndrome 06/29/2021   Pain in thoracic spine 10/07/2020   OSA on CPAP 07/30/2018   Left hip pain 07/02/2018   Snoring 02/27/2018    Intermittent sleepiness 02/27/2018   Sleep pattern disturbance 02/27/2018   Obesity (BMI 35.0-39.9 without comorbidity) 02/27/2018   Essential hypertension, benign 02/27/2018   Hyperlipidemia 02/20/2013   Hypertension 02/20/2013   Past Medical History:  Diagnosis Date   Allergy 1995   Arthritis 1990   Diabetes mellitus without complication (HCC)    Encounter for annual health examination 04/17/2023   GERD (gastroesophageal reflux disease)    Headache(784.0)    High cholesterol    Hypertension    Sleep apnea    mild- no CPAP   Stroke (HCC) 07/17/2003   mini- stroke due to HTN    Past Surgical History:  Procedure Laterality Date   ABDOMINAL HYSTERECTOMY     CHEST TUBE INSERTION  07/16/2002   lung punctured during shoulder surgery   FOOT SURGERY     bilateral foot surgery   HYSTEROSCOPY WITH D & C N/A 02/02/2013   Procedure: DILATATION AND CURETTAGE /HYSTEROSCOPY;  Surgeon: Geryl Rankins, MD;  Location: [facility];  Service: Gynecology;  Laterality: N/A;   KNEE ARTHROSCOPY     SHOULDER ARTHROSCOPY     TUBAL LIGATION      Current Outpatient Medications  Medication Sig Dispense Refill Last Dose/Taking   Alcohol Swabs (DROPSAFE ALCOHOL PREP) 70 % PADS USE AS DIRECTED WHEN CHECKING BLOOD SUGARS 300 each 3    atorvastatin (LIPITOR) 10 MG tablet Take one tab po daily M-Friday and skip Saturday and Sundays 90 tablet 1    Blood Glucose Calibration (TRUE METRIX LEVEL 1) Low SOLN USE AS DIRECTED WITH GLUCOSE METER 1 each 3    Blood Glucose Monitoring  Suppl (TRUE METRIX AIR GLUCOSE METER) w/Device KIT Inject 1 kit into the skin daily. Use as directed to check blood sugars 1 time per dx: e11.22 1 kit 1    Cholecalciferol (VITAMIN D) 2000 UNITS tablet Take 2,000 Units by mouth daily.       diclofenac Sodium (VOLTAREN) 1 % GEL Place onto the skin.      glucose blood (TRUE METRIX BLOOD GLUCOSE TEST) test strip USE AS INSTRUCTED 300 strip 3    latanoprost (XALATAN) 0.005 % ophthalmic solution  INSTILL 1 DROP INTO OU HS      levocetirizine (XYZAL) 5 MG tablet Take 5 mg by mouth daily as needed for allergies.      metFORMIN (GLUCOPHAGE-XR) 750 MG 24 hr tablet TAKE 1 TABLET EVERY DAY WITH EVENING MEAL 90 tablet 3    montelukast (SINGULAIR) 10 MG tablet TAKE 1 TABLET(10 MG) BY MOUTH DAILY 30 tablet 0    Multiple Vitamin (MULTIVITAMIN WITH MINERALS) TABS tablet Take 1 tablet by mouth daily.      Olmesartan-amLODIPine-HCTZ 40-5-12.5 MG TABS Take 1 tablet by mouth daily. 90 tablet 3    Semaglutide, 2 MG/DOSE, (OZEMPIC, 2 MG/DOSE,) 8 MG/3ML SOPN Inject 2 mg into the skin once a week. 3 mL 3    Triamcinolone Acetonide (NASACORT AQ NA) Place into the nose.      TRUEplus Lancets 33G MISC USE AS DIRECTED 300 each 3    No current facility-administered medications for this visit.   Allergies  Allergen Reactions   Codeine Nausea And Vomiting and Diarrhea   Meperidine     Other reaction(s): Delusions (intolerance)    Social History   Tobacco Use   Smoking status: Never   Smokeless tobacco: Never  Substance Use Topics   Alcohol use: Yes    Alcohol/week: 1.0 standard drink of alcohol    Types: 1 Shots of liquor per week    Comment: occassionally    Family History  Problem Relation Age of Onset   Cancer Mother    Hypertension Mother    Cancer Maternal Grandmother    Hypertension Maternal Grandmother    Arthritis Maternal Grandmother    Cancer Sister      Review of Systems  Musculoskeletal:  Positive for arthralgias.  All other systems reviewed and are negative.   Objective:  Physical Exam Constitutional:      General: She is not in acute distress.    Appearance: Normal appearance. She is not ill-appearing.  HENT:     Head: Normocephalic and atraumatic.     Right Ear: External ear normal.     Left Ear: External ear normal.     Nose: Nose normal.     Mouth/Throat:     Mouth: Mucous membranes are moist.     Pharynx: Oropharynx is clear.  Eyes:     Extraocular  Movements: Extraocular movements intact.     Conjunctiva/sclera: Conjunctivae normal.  Cardiovascular:     Rate and Rhythm: Normal rate and regular rhythm.     Pulses: Normal pulses.     Heart sounds: Normal heart sounds.  Pulmonary:     Effort: Pulmonary effort is normal.     Breath sounds: Normal breath sounds.  Abdominal:     General: Bowel sounds are normal.     Palpations: Abdomen is soft.     Tenderness: There is no abdominal tenderness.  Musculoskeletal:        General: Tenderness present.     Cervical back: Normal range of  motion and neck supple.     Comments: TTP over medial and lateral joint line.  No calf tenderness, swelling, or erythema.  No overlying lesions of area of chief complaint.  Decreased strength and ROM due to elicited pain.  Pre-operative ROM 0-100.  Dorsiflexion and plantarflexion intact.  Stable to varus and valgus stress.  BLE appear grossly neurovascularly intact.  Gait mildly antalgic.   Skin:    General: Skin is warm and dry.  Neurological:     Mental Status: She is alert and oriented to person, place, and time. Mental status is at baseline.  Psychiatric:        Mood and Affect: Mood normal.        Behavior: Behavior normal.     Vital signs in last 24 hours: @VSRANGES @  Labs:   Estimated body mass index is 38.9 kg/m as calculated from the following:   Height as of 06/12/23: 5\' 7"  (1.702 m).   Weight as of 06/12/23: 112.7 kg.   Imaging Review Plain radiographs demonstrate severe degenerative joint disease of the left knee(s). The overall alignment issignificant varus. The bone quality appears to be fair for age and reported activity level.      Assessment/Plan:  End stage arthritis, left knee   The patient history, physical examination, clinical judgment of the provider and imaging studies are consistent with end stage degenerative joint disease of the left knee(s) and total knee arthroplasty is deemed medically necessary. The treatment  options including medical management, injection therapy arthroscopy and arthroplasty were discussed at length. The risks and benefits of total knee arthroplasty were presented and reviewed. The risks due to aseptic loosening, infection, stiffness, patella tracking problems, thromboembolic complications and other imponderables were discussed. The patient acknowledged the explanation, agreed to proceed with the plan and consent was signed. Patient is being admitted for inpatient treatment for surgery, pain control, PT, OT, prophylactic antibiotics, VTE prophylaxis, progressive ambulation and ADL's and discharge planning. The patient is planning to be discharged home with outpatient PT.     Patient's anticipated LOS is less than 2 midnights, meeting these requirements: - Lives within 1 hour of care - Has a competent adult at home to recover with post-op recover - NO history of  - Chronic pain requiring opiods  - Coronary Artery Disease  - Heart failure  - Heart attack  - Stroke  - DVT/VTE  - Cardiac arrhythmia  - Respiratory Failure/COPD  - Renal failure  - Anemia  - Advanced Liver disease

## 2023-07-12 ENCOUNTER — Telehealth: Payer: Self-pay

## 2023-07-12 NOTE — Telephone Encounter (Signed)
PAP: Application for Ozempic has been submitted to PAP Companies: [employer], online   Please note provider portion of application has been faxed to Dr. Dorothyann Peng for Review

## 2023-07-14 ENCOUNTER — Other Ambulatory Visit: Payer: Self-pay | Admitting: Family Medicine

## 2023-07-14 DIAGNOSIS — J302 Other seasonal allergic rhinitis: Secondary | ICD-10-CM

## 2023-07-25 NOTE — Progress Notes (Addendum)
 Anesthesia Review:  PCP: Cleave Curling, Preop clearance on 06/12/23   Cardiologist : none  Chest x-ray : EKG : 06/12/23  CT Card- 03/12/22  Echo : 12/27/21  Stress test: Cardiac Cath :  Activity level: can do a flight of stairs without difficutly  Sleep Study/ CPAP : mild no cpap  Fasting Blood Sugar :      / Checks Blood Sugar -- times a day:   Blood Thinner/ Instructions /Last Dose: ASA / Instructions/ Last Dose :    DM- type 2-checks glucose once daily in am  Hgba1c-  06/12/23- 5.6  Metformin - none am of surgery  Ozempic - last dose on  07/21/23    Blood pressure at preop was 169/93.  Pt denies any chest pain, shortness of breath, headache or blurred vision.  Pt states she did not take blood pressure med this am.  Instructed pt when she gets home to take blood pressure med.  PST voiced understanding.     CMP done with Potassium of 3.0 routed to DR [facility] on 07/31/23. Sharlyn Deaner aware.

## 2023-07-29 NOTE — Patient Instructions (Signed)
 SURGICAL WAITING ROOM VISITATION  Patients having surgery or a procedure may have no more than 2 support people in the waiting area - these visitors may rotate.    Children under the age of 58 must have an adult with them who is not the patient.  Due to an increase in RSV and influenza rates and associated hospitalizations, children ages 22 and under may not visit patients in [facility] hospitals.  If the patient needs to stay at the hospital during part of their recovery, the visitor guidelines for inpatient rooms apply. Pre-op nurse will coordinate an appropriate time for 1 support person to accompany patient in pre-op.  This support person may not rotate.    Please refer to the [facility] website for the visitor guidelines for Inpatients (after your surgery is over and you are in a regular room).       Your procedure is scheduled on:  08/05/2023    Report to [facility] Main Entrance    Report to admitting at  0730 AM   Call this number if you have problems the morning of surgery [phone]   Do not eat food :After Midnight.   After Midnight you may have the following liquids until _ 0700_____ AM DAY OF SURGERY  Water  Non-Citrus Juices (without pulp, NO RED-Apple, White grape, White cranberry) Black Coffee (NO MILK/CREAM OR CREAMERS, sugar ok)  Clear Tea (NO MILK/CREAM OR CREAMERS, sugar ok) regular and decaf                             Plain Jell-O (NO RED)                                           Fruit ices (not with fruit pulp, NO RED)                                     Popsicles (NO RED)                                                               Sports drinks like Gatorade (NO RED)                   The day of surgery:  Drink ONE (1) Pre-Surgery Clear Ensure or G2 at   0700AM  ( have completed by ) the morning of surgery. Drink in one sitting. Do not sip.  This drink was given to you during your hospital  pre-op appointment visit. Nothing else to  drink after completing the  Pre-Surgery Clear Ensure or G2.          If you have questions, please contact your surgeon's office.       Oral Hygiene is also important to reduce your risk of infection.                                    Remember - BRUSH YOUR TEETH THE MORNING OF SURGERY WITH YOUR  REGULAR TOOTHPASTE  DENTURES WILL BE REMOVED PRIOR TO SURGERY PLEASE DO NOT APPLY "Poly grip" OR ADHESIVES!!!   Do NOT smoke after Midnight   Stop all vitamins and herbal supplements 7 days before surgery.   Take these medicines the morning of surgery with A SIP OF WATER :  singulair                 Metformin - none day of surgery               Ozempic - stop for 7 days prior to surgery.  Last dose on   DO NOT TAKE ANY ORAL DIABETIC MEDICATIONS DAY OF YOUR SURGERY  Bring CPAP mask and tubing day of surgery.                              You may not have any metal on your body including hair pins, jewelry, and body piercing             Do not wear make-up, lotions, powders, perfumes/cologne, or deodorant  Do not wear nail polish including gel and S&S, artificial/acrylic nails, or any other type of covering on natural nails including finger and toenails. If you have artificial nails, gel coating, etc. that needs to be removed by a nail salon please have this removed prior to surgery or surgery may need to be canceled/ delayed if the surgeon/ anesthesia feels like they are unable to be safely monitored.   Do not shave  48 hours prior to surgery.               Men may shave face and neck.   Do not bring valuables to the hospital. [facility] IS NOT             RESPONSIBLE   FOR VALUABLES.   Contacts, glasses, dentures or bridgework may not be worn into surgery.   Bring small overnight bag day of surgery.   DO NOT BRING YOUR HOME MEDICATIONS TO THE HOSPITAL. PHARMACY WILL DISPENSE MEDICATIONS LISTED ON YOUR MEDICATION LIST TO YOU DURING YOUR ADMISSION IN THE HOSPITAL!    Patients  discharged on the day of surgery will not be allowed to drive home.  Someone NEEDS to stay with you for the first 24 hours after anesthesia.   Special Instructions: Bring a copy of your healthcare power of attorney and living will documents the day of surgery if you haven't scanned them before.              Please read over the following fact sheets you were given: IF YOU HAVE QUESTIONS ABOUT YOUR PRE-OP INSTRUCTIONS PLEASE CALL [phone]   If you received a COVID test during your pre-op visit  it is requested that you wear a mask when out in public, stay away from anyone that may not be feeling well and notify your surgeon if you develop symptoms. If you test positive for Covid or have been in contact with anyone that has tested positive in the last 10 days please notify you surgeon.      Pre-operative 5 CHG Bath Instructions   You can play a key role in reducing the risk of infection after surgery. Your skin needs to be as free of germs as possible. You can reduce the number of germs on your skin by washing with CHG (chlorhexidine  gluconate) soap before surgery. CHG is an antiseptic soap that kills germs and continues to kill germs even  after washing.   DO NOT use if you have an allergy to chlorhexidine /CHG or antibacterial soaps. If your skin becomes reddened or irritated, stop using the CHG and notify one of our RNs at [phone].   Please shower with the CHG soap starting 4 days before surgery using the following schedule:     Please keep in mind the following:  DO NOT shave, including legs and underarms, starting the day of your first shower.   You may shave your face at any point before/day of surgery.  Place clean sheets on your bed the day you start using CHG soap. Use a clean washcloth (not used since being washed) for each shower. DO NOT sleep with pets once you start using the CHG.   CHG Shower Instructions:  If you choose to wash your hair and private area, wash first  with your normal shampoo/soap.  After you use shampoo/soap, rinse your hair and body thoroughly to remove shampoo/soap residue.  Turn the water  OFF and apply about 3 tablespoons (45 ml) of CHG soap to a CLEAN washcloth.  Apply CHG soap ONLY FROM YOUR NECK DOWN TO YOUR TOES (washing for 3-5 minutes)  DO NOT use CHG soap on face, private areas, open wounds, or sores.  Pay special attention to the area where your surgery is being performed.  If you are having back surgery, having someone wash your back for you may be helpful. Wait 2 minutes after CHG soap is applied, then you may rinse off the CHG soap.  Pat dry with a clean towel  Put on clean clothes/pajamas   If you choose to wear lotion, please use ONLY the CHG-compatible lotions on the back of this paper.     Additional instructions for the day of surgery: DO NOT APPLY any lotions, deodorants, cologne, or perfumes.   Put on clean/comfortable clothes.  Brush your teeth.  Ask your nurse before applying any prescription medications to the skin.      CHG Compatible Lotions   Aveeno Moisturizing lotion  Cetaphil Moisturizing Cream  Cetaphil Moisturizing Lotion  Clairol Herbal Essence Moisturizing Lotion, Dry Skin  Clairol Herbal Essence Moisturizing Lotion, Extra Dry Skin  Clairol Herbal Essence Moisturizing Lotion, Normal Skin  Curel Age Defying Therapeutic Moisturizing Lotion with Alpha Hydroxy  Curel Extreme Care Body Lotion  Curel Soothing Hands Moisturizing Hand Lotion  Curel Therapeutic Moisturizing Cream, Fragrance-Free  Curel Therapeutic Moisturizing Lotion, Fragrance-Free  Curel Therapeutic Moisturizing Lotion, Original Formula  Eucerin Daily Replenishing Lotion  Eucerin Dry Skin Therapy Plus Alpha Hydroxy Crme  Eucerin Dry Skin Therapy Plus Alpha Hydroxy Lotion  Eucerin Original Crme  Eucerin Original Lotion  Eucerin Plus Crme Eucerin Plus Lotion  Eucerin TriLipid Replenishing Lotion  Keri Anti-Bacterial Hand  Lotion  Keri Deep Conditioning Original Lotion Dry Skin Formula Softly Scented  Keri Deep Conditioning Original Lotion, Fragrance Free Sensitive Skin Formula  Keri Lotion Fast Absorbing Fragrance Free Sensitive Skin Formula  Keri Lotion Fast Absorbing Softly Scented Dry Skin Formula  Keri Original Lotion  Keri Skin Renewal Lotion Keri Silky Smooth Lotion  Keri Silky Smooth Sensitive Skin Lotion  Nivea Body Creamy Conditioning Oil  Nivea Body Extra Enriched Teacher, adult education Moisturizing Lotion Nivea Crme  Nivea Skin Firming Lotion  NutraDerm 30 Skin Lotion  NutraDerm Skin Lotion  NutraDerm Therapeutic Skin Cream  NutraDerm Therapeutic Skin Lotion  ProShield Protective Hand Cream  Provon moisturizing lotion

## 2023-07-30 ENCOUNTER — Other Ambulatory Visit: Payer: Self-pay | Admitting: Internal Medicine

## 2023-07-31 ENCOUNTER — Other Ambulatory Visit: Payer: Self-pay

## 2023-07-31 ENCOUNTER — Encounter (HOSPITAL_COMMUNITY): Payer: Self-pay

## 2023-07-31 ENCOUNTER — Encounter (HOSPITAL_COMMUNITY)
Admission: RE | Admit: 2023-07-31 | Discharge: 2023-07-31 | Disposition: A | Discharge status: Home / Self Care | Payer: Medicare PPO | Source: Ambulatory Visit | Attending: Orthopedic Surgery | Admitting: Orthopedic Surgery

## 2023-07-31 VITALS — BP 169/93 | HR 67 | Temp 98.0°F | Resp 16 | Ht 66.0 in

## 2023-07-31 DIAGNOSIS — Z8673 Personal history of transient ischemic attack (TIA), and cerebral infarction without residual deficits: Secondary | ICD-10-CM | POA: Diagnosis not present

## 2023-07-31 DIAGNOSIS — Z01812 Encounter for preprocedural laboratory examination: Secondary | ICD-10-CM | POA: Diagnosis not present

## 2023-07-31 DIAGNOSIS — M25562 Pain in left knee: Secondary | ICD-10-CM | POA: Diagnosis not present

## 2023-07-31 DIAGNOSIS — I10 Essential (primary) hypertension: Secondary | ICD-10-CM | POA: Insufficient documentation

## 2023-07-31 DIAGNOSIS — K449 Diaphragmatic hernia without obstruction or gangrene: Secondary | ICD-10-CM | POA: Insufficient documentation

## 2023-07-31 DIAGNOSIS — G8929 Other chronic pain: Secondary | ICD-10-CM | POA: Diagnosis not present

## 2023-07-31 DIAGNOSIS — I700 Atherosclerosis of aorta: Secondary | ICD-10-CM | POA: Diagnosis not present

## 2023-07-31 DIAGNOSIS — K219 Gastro-esophageal reflux disease without esophagitis: Secondary | ICD-10-CM | POA: Diagnosis not present

## 2023-07-31 DIAGNOSIS — E669 Obesity, unspecified: Secondary | ICD-10-CM | POA: Insufficient documentation

## 2023-07-31 DIAGNOSIS — E119 Type 2 diabetes mellitus without complications: Secondary | ICD-10-CM | POA: Diagnosis not present

## 2023-07-31 DIAGNOSIS — Z01818 Encounter for other preprocedural examination: Secondary | ICD-10-CM

## 2023-07-31 DIAGNOSIS — M1712 Unilateral primary osteoarthritis, left knee: Secondary | ICD-10-CM | POA: Diagnosis not present

## 2023-07-31 DIAGNOSIS — G4733 Obstructive sleep apnea (adult) (pediatric): Secondary | ICD-10-CM | POA: Insufficient documentation

## 2023-07-31 LAB — CBC WITH DIFFERENTIAL/PLATELET
Abs Immature Granulocytes: 0.01 10*3/uL (ref 0.00–0.07)
Basophils Absolute: 0 10*3/uL (ref 0.0–0.1)
Basophils Relative: 0 %
Eosinophils Absolute: 0.1 10*3/uL (ref 0.0–0.5)
Eosinophils Relative: 1 %
HCT: 45.8 % (ref 36.0–46.0)
Hemoglobin: 14.1 g/dL (ref 12.0–15.0)
Immature Granulocytes: 0 %
Lymphocytes Relative: 37 %
Lymphs Abs: 1.9 10*3/uL (ref 0.7–4.0)
MCH: 26.6 pg (ref 26.0–34.0)
MCHC: 30.8 g/dL (ref 30.0–36.0)
MCV: 86.4 fL (ref 80.0–100.0)
Monocytes Absolute: 0.5 10*3/uL (ref 0.1–1.0)
Monocytes Relative: 9 %
Neutro Abs: 2.6 10*3/uL (ref 1.7–7.7)
Neutrophils Relative %: 53 %
Platelets: 277 10*3/uL (ref 150–400)
RBC: 5.3 MIL/uL — ABNORMAL HIGH (ref 3.87–5.11)
RDW: 14.2 % (ref 11.5–15.5)
WBC: 5.1 10*3/uL (ref 4.0–10.5)
nRBC: 0 % (ref 0.0–0.2)

## 2023-07-31 LAB — COMPREHENSIVE METABOLIC PANEL
ALT: 14 U/L (ref 0–44)
AST: 17 U/L (ref 15–41)
Albumin: 3.7 g/dL (ref 3.5–5.0)
Alkaline Phosphatase: 70 U/L (ref 38–126)
Anion gap: 9 (ref 5–15)
BUN: 13 mg/dL (ref 8–23)
CO2: 25 mmol/L (ref 22–32)
Calcium: 9.4 mg/dL (ref 8.9–10.3)
Chloride: 104 mmol/L (ref 98–111)
Creatinine, Ser: 0.66 mg/dL (ref 0.44–1.00)
GFR, Estimated: >60 mL/min (ref >60)
Glucose, Bld: 128 mg/dL — ABNORMAL HIGH (ref 70–99)
Potassium: 3 mmol/L — ABNORMAL LOW (ref 3.5–5.1)
Sodium: 138 mmol/L (ref 135–145)
Total Bilirubin: 0.4 mg/dL (ref 0.0–1.2)
Total Protein: 6.6 g/dL (ref 6.5–8.1)

## 2023-07-31 LAB — SURGICAL PCR SCREEN
MRSA, PCR: NEGATIVE
Staphylococcus aureus: NEGATIVE

## 2023-07-31 LAB — GLUCOSE, CAPILLARY: Glucose-Capillary: 147 mg/dL — ABNORMAL HIGH (ref 70–99)

## 2023-08-01 ENCOUNTER — Encounter (HOSPITAL_COMMUNITY): Payer: Self-pay

## 2023-08-01 DIAGNOSIS — H40023 Open angle with borderline findings, high risk, bilateral: Secondary | ICD-10-CM | POA: Diagnosis not present

## 2023-08-01 LAB — HM DIABETES EYE EXAM

## 2023-08-01 NOTE — Anesthesia Preprocedure Evaluation (Addendum)
Anesthesia Evaluation  Patient identified by MRN, date of birth, ID band Patient awake    Reviewed: Allergy & Precautions, NPO status , Patient's Chart, lab work & pertinent test results  Airway Mallampati: I  TM Distance: >3 FB Neck ROM: Full    Dental  (+) Teeth Intact, Dental Advisory Given   Pulmonary sleep apnea    breath sounds clear to auscultation       Cardiovascular hypertension,  Rhythm:Regular Rate:Normal     Neuro/Psych  Neuromuscular disease CVA  negative psych ROS   GI/Hepatic Neg liver ROS,GERD  ,,  Endo/Other  diabetes    Renal/GU negative Renal ROS     Musculoskeletal  (+) Arthritis ,    Abdominal   Peds  Hematology negative hematology ROS (+)   Anesthesia Other Findings   Reproductive/Obstetrics                             Anesthesia Physical Anesthesia Plan  ASA: 3  Anesthesia Plan: Spinal   Post-op Pain Management: Regional block*   Induction: Intravenous  PONV Risk Score and Plan: 3 and Ondansetron, Propofol infusion and Treatment may vary due to age or medical condition  Airway Management Planned: Natural Airway and Nasal Cannula  Additional Equipment: None  Intra-op Plan:   Post-operative Plan:   Informed Consent: I have reviewed the patients History and Physical, chart, labs and discussed the procedure including the risks, benefits and alternatives for the proposed anesthesia with the patient or authorized representative who has indicated his/her understanding and acceptance.       Plan Discussed with: CRNA  Anesthesia Plan Comments: (See PAT note from 1/13 by Sherlie Ban PA-C  Lab Results      Component                Value               Date                      WBC                      5.1                 07/31/2023                HGB                      14.1                07/31/2023                HCT                      45.8                 07/31/2023                MCV                      86.4                07/31/2023                PLT                      277  07/31/2023           )        Anesthesia Quick Evaluation

## 2023-08-01 NOTE — Progress Notes (Signed)
Case: [id number] Date/Time: 08/05/23 0958   Procedure: TOTAL KNEE ARTHROPLASTY (Left: Knee)   Anesthesia type: Spinal   Pre-op diagnosis: OA LEFT KNEE   Location: [facility] ROOM 08 / [facility]   Surgeons: Joen Laura, MD       DISCUSSION: [name] is a 73 year old female who presents to PAT prior to surgery above.  Past medical history significant for hypertension, mild OSA, aortic atherosclerosis, history of CVA (2005; no residual deficits), TAA (4.0cm by CT on 03/09/2022), GERD, hiatal hernia, diabetes, obesity, arthritis.  Patient is followed by PCP.  Last seen on 06/12/2023 for preop exam. She recently had carpal tunnel surgery on her right hand on 06/03/23.  Cleared for surgery:  "Preoperative clearance Assessment & Plan: EKG performed, NSR w/o acute changes. Her BP is elevated today, likely exacerbated by pain. Previously, her blood pressure has been controlled. Her diabetes is under excellent control, last A1c 5.6.  At this time, she denies having any concerning cardiac symptoms. She should be able to proceed with surgery with acceptable risk."  Ozempic- last dose on  07/21/23   VS: BP (!) 169/93   Pulse 67   Temp 36.7 C (Oral)   Resp 16   Ht 5\' 6"  (1.676 m)   LMP 01/23/2013   SpO2 96%   BMI 40.09 kg/m   PROVIDERS: Dorothyann Peng, MD   LABS: Labs reviewed: Acceptable for surgery. Mild hypokalemia. Results routed to PCP (all labs ordered are listed, but only abnormal results are displayed)  Labs Reviewed  CBC WITH DIFFERENTIAL/PLATELET - Abnormal; Notable for the following components:      Result Value   RBC 5.30 (*)    All other components within normal limits  COMPREHENSIVE METABOLIC PANEL - Abnormal; Notable for the following components:   Potassium 3.0 (*)    Glucose, Bld 128 (*)    All other components within normal limits  GLUCOSE, CAPILLARY - Abnormal; Notable for the following components:   Glucose-Capillary 147 (*)    All other components within normal  limits  SURGICAL PCR SCREEN  TYPE AND SCREEN     IMAGES: CT Chest overread 03/09/2022:  IMPRESSION: 1. No acute extracardiac abnormality. 2. Small hiatal hernia. 3.  Aortic Atherosclerosis (ICD10-I70.0).  EKG 06/12/23:  NSR, rate 72  CV: CT calcium score 03/09/2022:  IMPRESSION: 1.  Coronary calcium score of 0.   2.  Dilated ascending aorta measuring 40mm   Past Medical History:  Diagnosis Date   Allergy 1995   Arthritis 1990   Diabetes mellitus without complication (HCC)    Encounter for annual health examination 04/17/2023   GERD (gastroesophageal reflux disease)    High cholesterol    Hypertension    Sleep apnea    mild- no CPAP   Stroke (HCC) 07/17/2003   mini- stroke due to HTN    Past Surgical History:  Procedure Laterality Date   ABDOMINAL HYSTERECTOMY     CHEST TUBE INSERTION  07/16/2002   lung punctured during shoulder surgery   FOOT SURGERY     bilateral foot surgery   HYSTEROSCOPY WITH D & C N/A 02/02/2013   Procedure: DILATATION AND CURETTAGE /HYSTEROSCOPY;  Surgeon: Geryl Rankins, MD;  Location: [facility];  Service: Gynecology;  Laterality: N/A;   KNEE ARTHROSCOPY     SHOULDER ARTHROSCOPY     TUBAL LIGATION      MEDICATIONS:  Alcohol Swabs (DROPSAFE ALCOHOL PREP) 70 % PADS   atorvastatin (LIPITOR) 10 MG tablet   Blood Glucose Calibration (  TRUE METRIX LEVEL 1) Low SOLN   Blood Glucose Monitoring Suppl (TRUE METRIX AIR GLUCOSE METER) w/Device KIT   Cholecalciferol (VITAMIN D) 2000 UNITS tablet   glucose blood (TRUE METRIX BLOOD GLUCOSE TEST) test strip   latanoprost (XALATAN) 0.005 % ophthalmic solution   levocetirizine (XYZAL) 5 MG tablet   metFORMIN (GLUCOPHAGE-XR) 750 MG 24 hr tablet   montelukast (SINGULAIR) 10 MG tablet   Multiple Vitamin (MULTIVITAMIN WITH MINERALS) TABS tablet   Olmesartan-amLODIPine-HCTZ 40-5-12.5 MG TABS   Semaglutide, 2 MG/DOSE, (OZEMPIC, 2 MG/DOSE,) 8 MG/3ML SOPN   Triamcinolone Acetonide (NASACORT AQ NA)    TRUEplus Lancets 33G MISC   No current facility-administered medications for this encounter.   Marcille Blanco [facility] Short Stay/Anesthesiology [facility] Phone [phone] 08/01/2023 9:39 AM

## 2023-08-02 NOTE — Care Plan (Signed)
Ortho Bundle Case Management Note  Patient Details  Name: [name] MRN: [id number] Date of Birth: 1951-04-30   met with patient in the office for H&P. will discharge to home with family to assist. has DME. [facility] will see her at home for 4 visits prior to starting in the clinic. discharge instructions discussed and questions answered                 DME Arranged:    DME Agency:     HH Arranged:    HH Agency:     Additional Comments: Please contact me with any questions of if this plan should need to change.  Shauna Hugh,  RN,BSN,MHA,CCM  [facility] Orthopaedic Specialist  [phone] 08/02/2023, 8:18 AM

## 2023-08-02 NOTE — Telephone Encounter (Signed)
Re-faxed provider portion to Dr. Dorothyann Peng

## 2023-08-05 ENCOUNTER — Ambulatory Visit (HOSPITAL_COMMUNITY): Payer: Medicare PPO | Admitting: Medical

## 2023-08-05 ENCOUNTER — Ambulatory Visit (HOSPITAL_COMMUNITY): Payer: Medicare PPO | Admitting: Anesthesiology

## 2023-08-05 ENCOUNTER — Encounter (HOSPITAL_COMMUNITY)
Admission: RE | Disposition: A | Discharge status: Home / Self Care | Payer: Self-pay | Source: Ambulatory Visit | Attending: Orthopedic Surgery

## 2023-08-05 ENCOUNTER — Encounter (HOSPITAL_COMMUNITY): Payer: Self-pay | Admitting: Orthopedic Surgery

## 2023-08-05 ENCOUNTER — Ambulatory Visit (HOSPITAL_COMMUNITY): Payer: Medicare PPO

## 2023-08-05 ENCOUNTER — Ambulatory Visit (HOSPITAL_COMMUNITY)
Admission: RE | Admit: 2023-08-05 | Discharge: 2023-08-05 | Disposition: A | Discharge status: Home / Self Care | Payer: Medicare PPO | Source: Ambulatory Visit | Attending: Orthopedic Surgery | Admitting: Orthopedic Surgery

## 2023-08-05 DIAGNOSIS — M1712 Unilateral primary osteoarthritis, left knee: Secondary | ICD-10-CM | POA: Diagnosis not present

## 2023-08-05 DIAGNOSIS — Z7984 Long term (current) use of oral hypoglycemic drugs: Secondary | ICD-10-CM | POA: Diagnosis not present

## 2023-08-05 DIAGNOSIS — Z96652 Presence of left artificial knee joint: Secondary | ICD-10-CM | POA: Diagnosis not present

## 2023-08-05 DIAGNOSIS — G4733 Obstructive sleep apnea (adult) (pediatric): Secondary | ICD-10-CM | POA: Diagnosis not present

## 2023-08-05 DIAGNOSIS — I10 Essential (primary) hypertension: Secondary | ICD-10-CM

## 2023-08-05 DIAGNOSIS — E7800 Pure hypercholesterolemia, unspecified: Secondary | ICD-10-CM | POA: Insufficient documentation

## 2023-08-05 DIAGNOSIS — G8918 Other acute postprocedural pain: Secondary | ICD-10-CM | POA: Diagnosis not present

## 2023-08-05 DIAGNOSIS — E119 Type 2 diabetes mellitus without complications: Secondary | ICD-10-CM

## 2023-08-05 DIAGNOSIS — Z7985 Long-term (current) use of injectable non-insulin antidiabetic drugs: Secondary | ICD-10-CM | POA: Diagnosis not present

## 2023-08-05 DIAGNOSIS — E1169 Type 2 diabetes mellitus with other specified complication: Secondary | ICD-10-CM | POA: Insufficient documentation

## 2023-08-05 DIAGNOSIS — Z8673 Personal history of transient ischemic attack (TIA), and cerebral infarction without residual deficits: Secondary | ICD-10-CM | POA: Insufficient documentation

## 2023-08-05 DIAGNOSIS — R609 Edema, unspecified: Secondary | ICD-10-CM | POA: Diagnosis not present

## 2023-08-05 DIAGNOSIS — Z471 Aftercare following joint replacement surgery: Secondary | ICD-10-CM | POA: Diagnosis not present

## 2023-08-05 HISTORY — PX: TOTAL KNEE ARTHROPLASTY: SHX125

## 2023-08-05 LAB — TYPE AND SCREEN
ABO/RH(D): B POS
Antibody Screen: NEGATIVE

## 2023-08-05 LAB — POTASSIUM: Potassium: 3.7 mmol/L (ref 3.5–5.1)

## 2023-08-05 LAB — GLUCOSE, CAPILLARY
Glucose-Capillary: 114 mg/dL — ABNORMAL HIGH (ref 70–99)
Glucose-Capillary: 118 mg/dL — ABNORMAL HIGH (ref 70–99)

## 2023-08-05 LAB — ABO/RH: ABO/RH(D): B POS

## 2023-08-05 SURGERY — ARTHROPLASTY, KNEE, TOTAL
Anesthesia: Spinal | Site: Knee | Laterality: Left

## 2023-08-05 MED ORDER — BUPIVACAINE IN DEXTROSE 0.75-8.25 % IT SOLN
INTRATHECAL | Status: DC | PRN
  Administered 2023-08-05: 2 mL via INTRATHECAL

## 2023-08-05 MED ORDER — ASPIRIN 81 MG PO TBEC: 81.0000 mg | DELAYED_RELEASE_TABLET | Freq: Two times a day (BID) | ORAL | Status: AC

## 2023-08-05 MED ORDER — ACETAMINOPHEN 500 MG PO TABS: 1000.0000 mg | ORAL_TABLET | Freq: Four times a day (QID) | ORAL | Status: DC

## 2023-08-05 MED ORDER — INSULIN ASPART 100 UNIT/ML IJ SOLN: 0.0000 [IU] | INTRAMUSCULAR | Status: DC | PRN

## 2023-08-05 MED ORDER — LACTATED RINGERS IV BOLUS: 250.0000 mL | Freq: Once | INTRAVENOUS | Status: DC

## 2023-08-05 MED ORDER — CELECOXIB 100 MG PO CAPS: 100.0000 mg | ORAL_CAPSULE | Freq: Two times a day (BID) | ORAL | 0 refills | Status: AC

## 2023-08-05 MED ORDER — HYDROMORPHONE HCL 1 MG/ML IJ SOLN
INTRAMUSCULAR | Status: AC
  Filled 2023-08-05: qty 1

## 2023-08-05 MED ORDER — SODIUM CHLORIDE (PF) 0.9 % IJ SOLN
INTRAMUSCULAR | Status: AC
  Filled 2023-08-05: qty 30

## 2023-08-05 MED ORDER — SODIUM CHLORIDE 0.9 % IV SOLN: INTRAVENOUS | Status: DC

## 2023-08-05 MED ORDER — ACETAMINOPHEN 160 MG/5ML PO SOLN: 325.0000 mg | Freq: Once | ORAL | Status: DC | PRN

## 2023-08-05 MED ORDER — LACTATED RINGERS IV SOLN: INTRAVENOUS | Status: AC

## 2023-08-05 MED ORDER — PROPOFOL 500 MG/50ML IV EMUL
INTRAVENOUS | Status: DC | PRN
  Administered 2023-08-05: 50 ug/kg/min via INTRAVENOUS

## 2023-08-05 MED ORDER — 0.9 % SODIUM CHLORIDE (POUR BTL) OPTIME
TOPICAL | Status: DC | PRN
  Administered 2023-08-05: 1000 mL

## 2023-08-05 MED ORDER — METHOCARBAMOL 500 MG PO TABS
500.0000 mg | ORAL_TABLET | Freq: Three times a day (TID) | ORAL | 0 refills | Status: AC | PRN
Start: 2023-08-05 — End: 2023-08-15

## 2023-08-05 MED ORDER — OXYCODONE HCL 5 MG PO TABS: 5.0000 mg | ORAL_TABLET | ORAL | 0 refills | Status: AC | PRN

## 2023-08-05 MED ORDER — METHOCARBAMOL 500 MG PO TABS
500.0000 mg | ORAL_TABLET | Freq: Four times a day (QID) | ORAL | Status: DC | PRN
  Administered 2023-08-05: 500 mg via ORAL

## 2023-08-05 MED ORDER — PROPOFOL 10 MG/ML IV BOLUS
INTRAVENOUS | Status: DC | PRN
  Administered 2023-08-05: 30 mg via INTRAVENOUS
  Administered 2023-08-05: 10 mg via INTRAVENOUS
  Administered 2023-08-05: 40 mg via INTRAVENOUS

## 2023-08-05 MED ORDER — ONDANSETRON HCL 4 MG/2ML IJ SOLN: 4.0000 mg | Freq: Four times a day (QID) | INTRAMUSCULAR | Status: DC | PRN

## 2023-08-05 MED ORDER — ONDANSETRON HCL 4 MG PO TABS: 4.0000 mg | ORAL_TABLET | Freq: Three times a day (TID) | ORAL | 0 refills | Status: AC | PRN

## 2023-08-05 MED ORDER — FENTANYL CITRATE PF 50 MCG/ML IJ SOSY
100.0000 ug | PREFILLED_SYRINGE | INTRAMUSCULAR | Status: AC
Start: 2023-08-05 — End: 2023-08-05
  Administered 2023-08-05: 50 ug via INTRAVENOUS
  Filled 2023-08-05: qty 2

## 2023-08-05 MED ORDER — ONDANSETRON HCL 4 MG/2ML IJ SOLN
INTRAMUSCULAR | Status: AC
Start: 2023-08-05 — End: ?
  Filled 2023-08-05: qty 2

## 2023-08-05 MED ORDER — BUPIVACAINE LIPOSOME 1.3 % IJ SUSP: 20.0000 mL | Freq: Once | INTRAMUSCULAR | Status: DC

## 2023-08-05 MED ORDER — METHOCARBAMOL 1000 MG/10ML IJ SOLN: 500.0000 mg | Freq: Four times a day (QID) | INTRAMUSCULAR | Status: DC | PRN

## 2023-08-05 MED ORDER — POLYETHYLENE GLYCOL 3350 17 G PO PACK: 17.0000 g | PACK | Freq: Every day | ORAL | 0 refills | Status: DC

## 2023-08-05 MED ORDER — OXYCODONE HCL 5 MG PO TABS
ORAL_TABLET | ORAL | Status: AC
  Filled 2023-08-05: qty 1

## 2023-08-05 MED ORDER — MIDAZOLAM HCL 2 MG/2ML IJ SOLN
2.0000 mg | INTRAMUSCULAR | Status: AC
Start: 2023-08-05 — End: 2023-08-05
  Administered 2023-08-05: 1 mg via INTRAVENOUS
  Filled 2023-08-05: qty 2

## 2023-08-05 MED ORDER — METHOCARBAMOL 500 MG PO TABS
ORAL_TABLET | ORAL | Status: AC
  Filled 2023-08-05: qty 1

## 2023-08-05 MED ORDER — LIDOCAINE HCL (PF) 2 % IJ SOLN
INTRAMUSCULAR | Status: AC
  Filled 2023-08-05: qty 5

## 2023-08-05 MED ORDER — LACTATED RINGERS IV BOLUS
500.0000 mL | Freq: Once | INTRAVENOUS | Status: AC
  Administered 2023-08-05: 500 mL via INTRAVENOUS

## 2023-08-05 MED ORDER — SODIUM CHLORIDE 0.9 % IR SOLN
Status: DC | PRN
  Administered 2023-08-05: 2000 mL

## 2023-08-05 MED ORDER — ACETAMINOPHEN 500 MG PO TABS
1000.0000 mg | ORAL_TABLET | Freq: Once | ORAL | Status: AC
  Administered 2023-08-05: 1000 mg via ORAL
  Filled 2023-08-05: qty 2

## 2023-08-05 MED ORDER — PROPOFOL 1000 MG/100ML IV EMUL
INTRAVENOUS | Status: AC
  Filled 2023-08-05: qty 100

## 2023-08-05 MED ORDER — HYDROMORPHONE HCL 1 MG/ML IJ SOLN
0.2500 mg | INTRAMUSCULAR | Status: DC | PRN
  Administered 2023-08-05 (×4): 0.5 mg via INTRAVENOUS

## 2023-08-05 MED ORDER — OXYCODONE HCL 5 MG PO TABS
5.0000 mg | ORAL_TABLET | ORAL | Status: DC | PRN
  Administered 2023-08-05: 5 mg via ORAL

## 2023-08-05 MED ORDER — KETOROLAC TROMETHAMINE 15 MG/ML IJ SOLN: 7.5000 mg | Freq: Four times a day (QID) | INTRAMUSCULAR | Status: DC

## 2023-08-05 MED ORDER — MEPERIDINE HCL 50 MG/ML IJ SOLN: 6.2500 mg | INTRAMUSCULAR | Status: DC | PRN

## 2023-08-05 MED ORDER — ROPIVACAINE HCL 5 MG/ML IJ SOLN
INTRAMUSCULAR | Status: DC | PRN
  Administered 2023-08-05: 30 mL via PERINEURAL

## 2023-08-05 MED ORDER — ACETAMINOPHEN 325 MG PO TABS: 325.0000 mg | ORAL_TABLET | Freq: Once | ORAL | Status: DC | PRN

## 2023-08-05 MED ORDER — LACTATED RINGERS IV SOLN: INTRAVENOUS | Status: DC | PRN

## 2023-08-05 MED ORDER — DEXAMETHASONE SODIUM PHOSPHATE 4 MG/ML IJ SOLN
4.0000 mg | Freq: Once | INTRAMUSCULAR | Status: AC
Start: 2023-08-05 — End: 2023-08-05
  Administered 2023-08-05: 10 mg via INTRAVENOUS

## 2023-08-05 MED ORDER — DEXAMETHASONE SODIUM PHOSPHATE 10 MG/ML IJ SOLN
INTRAMUSCULAR | Status: AC
  Filled 2023-08-05: qty 1

## 2023-08-05 MED ORDER — DROPERIDOL 2.5 MG/ML IJ SOLN: 0.6250 mg | Freq: Once | INTRAMUSCULAR | Status: DC | PRN

## 2023-08-05 MED ORDER — TRANEXAMIC ACID-NACL 1000-0.7 MG/100ML-% IV SOLN
1000.0000 mg | INTRAVENOUS | Status: AC
  Administered 2023-08-05: 1000 mg via INTRAVENOUS
  Filled 2023-08-05: qty 100

## 2023-08-05 MED ORDER — ACETAMINOPHEN 325 MG PO TABS: 325.0000 mg | ORAL_TABLET | Freq: Four times a day (QID) | ORAL | Status: DC | PRN

## 2023-08-05 MED ORDER — ACETAMINOPHEN 10 MG/ML IV SOLN
INTRAVENOUS | Status: AC
  Filled 2023-08-05: qty 100

## 2023-08-05 MED ORDER — ONDANSETRON HCL 4 MG/2ML IJ SOLN
INTRAMUSCULAR | Status: DC | PRN
  Administered 2023-08-05: 4 mg via INTRAVENOUS

## 2023-08-05 MED ORDER — CEFAZOLIN SODIUM-DEXTROSE 2-4 GM/100ML-% IV SOLN: 2.0000 g | Freq: Four times a day (QID) | INTRAVENOUS | Status: DC

## 2023-08-05 MED ORDER — ONDANSETRON HCL 4 MG PO TABS
4.0000 mg | ORAL_TABLET | Freq: Four times a day (QID) | ORAL | Status: DC | PRN
Start: 2023-08-05 — End: 2023-08-05

## 2023-08-05 MED ORDER — BUPIVACAINE LIPOSOME 1.3 % IJ SUSP
INTRAMUSCULAR | Status: AC
  Filled 2023-08-05: qty 20

## 2023-08-05 MED ORDER — BUPIVACAINE-EPINEPHRINE 0.25% -1:200000 IJ SOLN
INTRAMUSCULAR | Status: AC
  Filled 2023-08-05: qty 1

## 2023-08-05 MED ORDER — LIDOCAINE HCL (CARDIAC) PF 100 MG/5ML IV SOSY
PREFILLED_SYRINGE | INTRAVENOUS | Status: DC | PRN
  Administered 2023-08-05: 20 mg via INTRATRACHEAL

## 2023-08-05 MED ORDER — ACETAMINOPHEN 500 MG PO TABS
1000.0000 mg | ORAL_TABLET | Freq: Three times a day (TID) | ORAL | Status: AC | PRN
Start: 2023-08-05 — End: 2023-09-04

## 2023-08-05 MED ORDER — POVIDONE-IODINE 10 % EX SWAB: 2.0000 | Freq: Once | CUTANEOUS | Status: DC

## 2023-08-05 MED ORDER — ISOPROPYL ALCOHOL 70 % SOLN
Status: DC | PRN
  Administered 2023-08-05: 1 via TOPICAL

## 2023-08-05 MED ORDER — HYDROMORPHONE HCL 1 MG/ML IJ SOLN: 0.5000 mg | INTRAMUSCULAR | Status: DC | PRN

## 2023-08-05 MED ORDER — WATER FOR IRRIGATION, STERILE IR SOLN
Status: DC | PRN
  Administered 2023-08-05: 1000 mL

## 2023-08-05 MED ORDER — ACETAMINOPHEN 10 MG/ML IV SOLN
1000.0000 mg | Freq: Once | INTRAVENOUS | Status: DC | PRN
  Administered 2023-08-05: 1000 mg via INTRAVENOUS

## 2023-08-05 MED ORDER — SODIUM CHLORIDE (PF) 0.9 % IJ SOLN
INTRAMUSCULAR | Status: DC | PRN
  Administered 2023-08-05: 80 mL

## 2023-08-05 MED ORDER — CEFAZOLIN SODIUM-DEXTROSE 2-4 GM/100ML-% IV SOLN
2.0000 g | INTRAVENOUS | Status: AC
  Administered 2023-08-05: 2 g via INTRAVENOUS
  Filled 2023-08-05: qty 100

## 2023-08-05 MED ORDER — OMEPRAZOLE 40 MG PO CPDR: 40.0000 mg | DELAYED_RELEASE_CAPSULE | Freq: Every day | ORAL | 0 refills | Status: AC

## 2023-08-05 SURGICAL SUPPLY — 54 items
BAG COUNTER SPONGE SURGICOUNT (BAG) IMPLANT
BLADE SAG 18X100X1.27 (BLADE) ×1 IMPLANT
BLADE SAW SAG 35X64 .89 (BLADE) ×1 IMPLANT
BLADE SAW SGTL 11.0X1.19X90.0M (BLADE) IMPLANT
BNDG COHESIVE 3X5 TAN ST LF (GAUZE/BANDAGES/DRESSINGS) ×1 IMPLANT
BNDG ELASTIC 6X10 VLCR STRL LF (GAUZE/BANDAGES/DRESSINGS) ×1 IMPLANT
BOWL SMART MIX CTS (DISPOSABLE) ×1 IMPLANT
CEMENT BONE R 1X40 (Cement) IMPLANT
CEMENT BONE REFOBACIN R1X40 US (Cement) IMPLANT
CHLORAPREP W/TINT 26 (MISCELLANEOUS) ×2 IMPLANT
COMP FEM CMT PS STD 5 LT (Joint) ×1 IMPLANT
COMPONENT FEM CMT PS STD 5 LT (Joint) IMPLANT
COVER SURGICAL LIGHT HANDLE (MISCELLANEOUS) ×1 IMPLANT
CUFF TRNQT CYL 34X4.125X (TOURNIQUET CUFF) ×1 IMPLANT
DERMABOND ADVANCED .7 DNX12 (GAUZE/BANDAGES/DRESSINGS) ×1 IMPLANT
DRAPE INCISE IOBAN 85X60 (DRAPES) ×1 IMPLANT
DRAPE SHEET LG 3/4 BI-LAMINATE (DRAPES) ×1 IMPLANT
DRAPE U-SHAPE 47X51 STRL (DRAPES) ×1 IMPLANT
DRSG AQUACEL AG ADV 3.5X10 (GAUZE/BANDAGES/DRESSINGS) ×1 IMPLANT
ELECT REM PT RETURN 15FT ADLT (MISCELLANEOUS) ×1 IMPLANT
GAUZE SPONGE 4X4 12PLY STRL (GAUZE/BANDAGES/DRESSINGS) ×1 IMPLANT
GLOVE BIO SURGEON STRL SZ 6.5 (GLOVE) ×2 IMPLANT
GLOVE BIOGEL PI IND STRL 6.5 (GLOVE) ×1 IMPLANT
GLOVE BIOGEL PI IND STRL 8 (GLOVE) ×1 IMPLANT
GLOVE SURG ORTHO 8.0 STRL STRW (GLOVE) ×2 IMPLANT
GOWN STRL REUS W/ TWL XL LVL3 (GOWN DISPOSABLE) ×2 IMPLANT
HOLDER FOLEY CATH W/STRAP (MISCELLANEOUS) ×1 IMPLANT
HOOD PEEL AWAY T7 (MISCELLANEOUS) ×3 IMPLANT
INSERT ASF PERS 11 CD/4-5 LT (Insert) IMPLANT
KIT TURNOVER KIT A (KITS) IMPLANT
MANIFOLD NEPTUNE II (INSTRUMENTS) ×1 IMPLANT
MARKER SKIN DUAL TIP RULER LAB (MISCELLANEOUS) ×1 IMPLANT
NS IRRIG 1000ML POUR BTL (IV SOLUTION) ×1 IMPLANT
PACK TOTAL KNEE CUSTOM (KITS) ×1 IMPLANT
PIN DRILL HDLS TROCAR 75 4PK (PIN) IMPLANT
SCREW HEADED 33MM KNEE (MISCELLANEOUS) IMPLANT
SET HNDPC FAN SPRY TIP SCT (DISPOSABLE) ×1 IMPLANT
SOLUTION IRRIG SURGIPHOR (IV SOLUTION) IMPLANT
SPIKE FLUID TRANSFER (MISCELLANEOUS) ×1 IMPLANT
STEM POLY PAT PLY 32M KNEE (Knees) IMPLANT
STEM TIBIA 5 DEG SZ D L KNEE (Knees) IMPLANT
STRIP CLOSURE SKIN 1/2X4 (GAUZE/BANDAGES/DRESSINGS) ×1 IMPLANT
SUT MNCRL AB 3-0 PS2 18 (SUTURE) ×1 IMPLANT
SUT STRATAFIX 0 PDS 27 VIOLET (SUTURE) ×1
SUT STRATAFIX 14 PDO 48 VLT (SUTURE) ×1 IMPLANT
SUT STRATAFIX PDO 1 14 VIOLET (SUTURE) ×1
SUT VIC AB 2-0 CT2 27 (SUTURE) ×2 IMPLANT
SUTURE STRATFX 0 PDS 27 VIOLET (SUTURE) ×1 IMPLANT
SYR 50ML LL SCALE MARK (SYRINGE) ×1 IMPLANT
TIBIA STEM 5 DEG SZ D L KNEE (Knees) ×1 IMPLANT
TRAY FOLEY MTR SLVR 14FR STAT (SET/KITS/TRAYS/PACK) IMPLANT
TUBE SUCTION HIGH CAP CLEAR NV (SUCTIONS) ×1 IMPLANT
UNDERPAD 30X36 HEAVY ABSORB (UNDERPADS AND DIAPERS) ×1 IMPLANT
WRAP KNEE MAXI GEL POST OP (GAUZE/BANDAGES/DRESSINGS) IMPLANT

## 2023-08-05 NOTE — Anesthesia Procedure Notes (Signed)
Procedure Name: MAC Date/Time: 08/05/2023 10:36 AM  Performed by: Dennison Nancy, CRNAPre-anesthesia Checklist: Patient identified, Emergency Drugs available, Suction available, Patient being monitored and Timeout performed Patient Re-evaluated:Patient Re-evaluated prior to induction Oxygen Delivery Method: Simple face mask Preoxygenation: Pre-oxygenation with 100% oxygen Dental Injury: Teeth and Oropharynx as per pre-operative assessment

## 2023-08-05 NOTE — Discharge Instructions (Signed)

## 2023-08-05 NOTE — Transfer of Care (Signed)
Immediate Anesthesia Transfer of Care Note  Patient: [name]  Procedure(s) Performed: TOTAL KNEE ARTHROPLASTY (Left: Knee)  Patient Location: PACU  Anesthesia Type:MAC and Spinal  Level of Consciousness: awake, alert , oriented, and patient cooperative  Airway & Oxygen Therapy: Patient Spontanous Breathing and Patient connected to face mask oxygen  Post-op Assessment: Report given to RN and Post -op Vital signs reviewed and stable  Post vital signs: Reviewed and stable  Last Vitals:  Vitals Value Taken Time  BP 124/81 08/05/23 1242  Temp    Pulse 78 08/05/23 1245  Resp 14 08/05/23 1245  SpO2 98 % 08/05/23 1245  Vitals shown include unfiled device data.  Last Pain:  Vitals:   08/05/23 0759  TempSrc:   PainSc: 7       Patients Stated Pain Goal: 6 (08/05/23 0759)  Complications: No notable events documented.

## 2023-08-05 NOTE — Op Note (Signed)
DATE OF SURGERY:  08/05/2023 TIME: 11:57 AM  PATIENT NAME:  [name]   AGE: 73 y.o.    PRE-OPERATIVE DIAGNOSIS: End-stage left knee osteoarthritis  POST-OPERATIVE DIAGNOSIS:  Same  PROCEDURE: Left total Knee Arthroplasty  SURGEON:  Kyrillos Adams A Maridel Pixler, MD   ASSISTANT: Kathie Dike, PA-C, present and scrubbed throughout the case, critical for assistance with exposure, retraction, instrumentation, and closure.   OPERATIVE IMPLANTS:  Cemented Zimmer persona size left CR standard femur, left the tibial baseplate, 11 mm MC poly insert, 32 mm all poly cemented patella Implant Name Type Inv. Item Serial No. Manufacturer Lot No. LRB No. Used Action  CEMENT BONE REFOBACIN [id number] [place] - [id number] Cement CEMENT BONE REFOBACIN [id number] [place]  ZIMMER RECON(ORTH,TRAU,BIO,SG) [id number] Left 2 Implanted  STEM POLY PAT PLY 24M KNEE - [id number] Knees STEM POLY PAT PLY 24M KNEE  ZIMMER RECON(ORTH,TRAU,BIO,SG) [id number] Left 1 Implanted  TIBIA STEM 5 DEG SZ D L KNEE - [id number] Knees TIBIA STEM 5 DEG SZ D L KNEE  ZIMMER RECON(ORTH,TRAU,BIO,SG) [id number] Left 1 Implanted  COMP FEM CMT PS STD 5 LT - [id number] Joint COMP FEM CMT PS STD 5 LT  ZIMMER [employer]ON(ORTH,TRAU,BIO,SG) [id number] Left 1 Implanted  INSERT ASF PERS 11 CD/4-5 LT - [id number] Insert INSERT ASF PERS 11 CD/4-5 LT  ZIMMER RECON(ORTH,TRAU,BIO,SG) [id number] Left 1 Implanted      PREOPERATIVE INDICATIONS:  Grace ALZATE is a 73 y.o. year old female with end stage bone on bone degenerative arthritis of the knee who failed conservative treatment, including injections, antiinflammatories, activity modification, and assistive devices, and had significant impairment of their activities of daily living, and elected for Total Knee Arthroplasty.   The risks, benefits, and alternatives were discussed at length including but not limited to the risks of infection, bleeding, nerve injury, stiffness, blood clots, the need for revision surgery, cardiopulmonary  complications, among others, and they were willing to proceed.  ESTIMATED BLOOD LOSS: 50cc  OPERATIVE DESCRIPTION:   Once adequate anesthesia was induced, preoperative antibiotics, 2 gm of ancef,1 gm of Tranexamic Acid, and 8 mg of Decadron administered, the patient was positioned supine with a left thigh tourniquet placed.  The left lower extremity was prepped and draped in sterile fashion.  A time-  out was performed identifying the patient, planned procedure, and the appropriate extremity.     The leg was  exsanguinated, tourniquet elevated to 250 mmHg.  A midline incision was  made followed by median parapatellar arthrotomy. Anterior horn of the medial meniscus was released and resected. A medial release was performed, the infrapatellar fat pad was resected with care taken to protect the patellar tendon. The suprapatellar fat was removed to exposed the distal anterior femur. The anterior horn of the lateral meniscus and ACL were released.    Following initial  exposure, I first started with the femur  The femoral  canal was opened with a drill, canal was suctioned to try to prevent fat emboli.  An  intramedullary rod was passed set at 6 degrees valgus, 10 mm. The distal femur was resected.  Following this resection, the tibia was  subluxated anteriorly.  Using the extramedullary guide, 10 mm of bone was resected off   the proximal lateral tibia.  We confirmed the gap would be  stable medially and laterally with a size 10mm spacer block as well as confirmed that the tibial cut was perpendicular in the coronal plane, checking with an alignment rod.    Once this was done, the posterior  femoral referencing femoral sizer was placed under to the posterior condyles with 3 degrees of external rotational which was parallel to the transepicondylar axis and perpendicular to [employer]. The femur was sized to be a size 5 in the anterior-  posterior dimension. The  anterior, posterior, and  chamfer cuts  were made without difficulty nor   notching making certain that I was along the anterior cortex to help  with flexion gap stability. Next a laminar spreader was placed with the knee in flexion and the medial lateral menisci were resected.  5 cc of the Exparel mixture was injected in the medial side of the back of the knee and 3 cc in the lateral side.  1/2 inch curved osteotome was used to resect posterior osteophyte that was then removed with a pituitary rongeur.       At this point, the tibia was sized to be a size D.  The size D tray was  then pinned in position. Trial reduction was now carried with a 5 femur, D tibia, a 10 mm MC insert.  The knee had full extension and was stable to varus valgus stress in extension.  The knee was slightly tight in flexion and the PCL was partially released.   Attention was next directed to the patella.  Precut  measurement was noted to be 22 mm.  I resected down to 13 mm and used a  32mm patellar button to restore patellar height as well as cover the cut surface.     The patella lug holes were drilled and a 32mm patella poly trial was placed.    The knee was brought to full extension with good flexion stability with the patella tracking through the trochlea without application of pressure.     Next the femoral component was again assessed and determined to be seated and appropriately lateralized.  The femoral lug holes were drilled.  The femoral component was then removed. Tibial component was again assessed and felt to be seated and appropriately rotated with the medial third of the tubercle. The tibia was then drilled, and keel punched.     Final components were  opened and antibiotic cement was mixed.      Final implants were then  cemented onto cleaned and dried cut surfaces of bone with the knee brought to extension with a 32mm MC poly.  The knee was irrigated with sterile Betadine diluted in saline as well as pulse lavage normal saline.  The synovial  lining was  then injected a dilute Exparel with 30cc of 0.25% marcaine with epinephrine.         Once the cement had fully cured, excess cement was removed throughout the knee.  I confirmed that I was satisfied with the range of motion and stability, and the final 11mm MC poly insert was chosen.  It was placed into the knee.         The tourniquet had been let down at 60 minutes.  No significant hemostasis was required.  The medial parapatellar arthrotomy was then reapproximated using #1 Stratafix sutures with the knee  in flexion.  The remaining wound was closed with 0 stratafix, 2-0 Vicryl, and running 3-0 Monocryl. The knee was cleaned, dried, dressed sterilely using Dermabond and   Aquacel dressing.  The patient was then brought to recovery room in stable condition, tolerating the procedure  well. There were no complications.   Post op recs: WB: WBAT Abx: ancef Imaging: PACU xrays DVT prophylaxis: Aspirin  81mg  BID x4 weeks Follow up: 2 weeks after surgery for a wound check with Dr. Blanchie Dessert at [facility].  Address: [address], [place], [place] [address]  Office Phone: [phone]  Weber Cooks, MD Orthopaedic Surgery

## 2023-08-05 NOTE — Anesthesia Procedure Notes (Signed)
Anesthesia Regional Block: Adductor canal block   Pre-Anesthetic Checklist: , timeout performed,  Correct Patient, Correct Site, Correct Laterality,  Correct Procedure, Correct Position, site marked,  Risks and benefits discussed,  Surgical consent,  Pre-op evaluation,  At surgeon's request and post-op pain management  Laterality: Left  Prep: chloraprep       Needles:  Injection technique: Single-shot  Needle Type: Echogenic Stimulator Needle     Needle Length: 9cm  Needle Gauge: 21     Additional Needles:   Procedures:,,,, ultrasound used (permanent image in chart),,    Narrative:  Start time: 08/05/2023 9:15 AM End time: 08/05/2023 9:20 AM Injection made incrementally with aspirations every 5 mL.  Performed by: Personally  Anesthesiologist: Shelton Silvas, MD  Additional Notes: Discussed risks and benefits of the nerve block in detail, including but not limited vascular injury, permanent nerve damage and infection.   Patient tolerated the procedure well. Local anesthetic introduced in an incremental fashion under minimal resistance after negative aspirations. No paresthesias were elicited. After completion of the procedure, no acute issues were identified and patient continued to be monitored by RN.

## 2023-08-05 NOTE — Anesthesia Postprocedure Evaluation (Signed)
Anesthesia Post Note  Patient: Betzayra Lafountaine Fedak  Procedure(s) Performed: TOTAL KNEE ARTHROPLASTY (Left: Knee)     Patient location during evaluation: PACU Anesthesia Type: Spinal Level of consciousness: awake and alert Pain management: pain level controlled Vital Signs Assessment: post-procedure vital signs reviewed and stable Respiratory status: spontaneous breathing, nonlabored ventilation and respiratory function stable Cardiovascular status: blood pressure returned to baseline and stable Postop Assessment: no apparent nausea or vomiting Anesthetic complications: no   No notable events documented.  Last Vitals:  Vitals:   08/05/23 1345 08/05/23 1400  BP: (!) 148/82 111/65  Pulse: (!) 55 78  Resp: 14 16  Temp:    SpO2: 96% 96%    Last Pain:  Vitals:   08/05/23 1400  TempSrc:   PainSc: 3                  Lowella Curb

## 2023-08-05 NOTE — Progress Notes (Signed)
Orthopedic Tech Progress Note Patient Details:  [name] 12-05-1950 [id number]  Ortho Devices Type of Ortho Device: Bone foam zero knee Ortho Device/Splint Location: LLE Ortho Device/Splint Interventions: Ordered, Application, Adjustment   Post Interventions Patient Tolerated: Well Instructions Provided: Care of device  [place] A Gerilyn Pilgrim 08/05/2023, 1:17 PM

## 2023-08-05 NOTE — Evaluation (Signed)
Physical Therapy Evaluation Patient Details Name: [name] MRN: [id number] DOB: 06-14-1951 Today's Date: 08/05/2023  History of Present Illness  73 yo female presents to therapy s/p L TKA on 08/05/2023 due to failure of conservative measures. Pt PMH includes but is not limited to: aortic atherosclerosis, DM II, HLD, chronic pain, HTN, GERD, OSA, CVA and B foot surgery.  Clinical Impression   [name] is a 73 y.o. female POD 0 s/p L TKA. Patient reports IND with mobility at baseline. Patient is now limited by functional impairments (see PT problem list below) and requires CGA and cues for transfers and gait with RW. Patient was able to ambulate 30 feet x 2  with RW and CGA and cues for safe walker management with noted slight L LE instability. Patient educated on safe sequencing for stair mobility with use of handrail and SPC in R UE with family assist as needed, fall risk prevention, pain management and goal, use of CP/ice and car transfers pt and daughter  verbalized understanding of safe guarding position for people assisting with mobility. Patient instructed in exercises to facilitate ROM and circulation reviewed and HO provided. Patient will benefit from continued skilled PT interventions to address impairments and progress towards PLOF. Patient has met mobility goals at adequate level for discharge home with family support 4 HHPT sessions and then transition to OPPT; will continue to follow if pt continues acute stay to progress towards Mod I goals.       If plan is discharge home, recommend the following: A little help with walking and/or transfers;A little help with bathing/dressing/bathroom;Assistance with cooking/housework;Assist for transportation;Help with stairs or ramp for entrance   Can travel by private vehicle        Equipment Recommendations Rolling walker (2 wheels)  Recommendations for Other Services       Functional Status Assessment Patient has had a recent  decline in their functional status and demonstrates the ability to make significant improvements in function in a reasonable and predictable amount of time.     Precautions / Restrictions Precautions Precautions: Knee;Fall Restrictions Weight Bearing Restrictions Per Provider Order: No      Mobility  Bed Mobility Overal bed mobility: Needs Assistance Bed Mobility: Supine to Sit     Supine to sit: Contact guard, HOB elevated     General bed mobility comments: min cues    Transfers Overall transfer level: Needs assistance Equipment used: Rolling walker (2 wheels) Transfers: Sit to/from Stand Sit to Stand: Contact guard assist           General transfer comment: cues for proper UE and AD placement    Ambulation/Gait Ambulation/Gait assistance: Contact guard assist Gait Distance (Feet): 30 Feet Assistive device: Rolling walker (2 wheels) Gait Pattern/deviations: Step-to pattern, Antalgic, Trunk flexed Gait velocity: decreased     General Gait Details: noted LLE instability when in stance phase, pt instructed to perform co-contraction with quads and glutes for improved stability with pt demonstrating improved safety and stability with increased time and cues as above. pt limited with gait tolerance 30 feet x 2 due to fatugue. pt amb with slight trunk flexion and use of B UE support on RW to offload L LE in stance phase with cues for advacing RW  Stairs Stairs: Yes Stairs assistance: Min assist Stair Management: Two rails Number of Stairs: 2 General stair comments: step instruction initiated with B handrails cues for safety and sequencing and pt demonstrated L LE instability with first step requiring  mod A for fall prevention, with tactle cues for co-contraction for gultes and quads pt able to navigate the second step with B handrail and CGA with cues. pt indicated feeling very fatigued s/p 30 foot amb bout and first trial with step navigation and requried prolonged seated  theraputic rest break. second step trial with use of L handrail and R UE with SPC with pt requiring CGA and cues for sequencing and safety with use of co-contration when in stance phase. pt and duaghter instructed and both parties demonstrated verbal understanding  Wheelchair Mobility     Tilt Bed    Modified Rankin (Stroke Patients Only)       Balance Overall balance assessment: Needs assistance Sitting-balance support: Feet supported Sitting balance-Leahy Scale: Good     Standing balance support: Bilateral upper extremity supported, During functional activity, Reliant on assistive device for balance Standing balance-Leahy Scale: Poor                               Pertinent Vitals/Pain Pain Assessment Pain Assessment: 0-10 Pain Score: 7  Pain Location: L knee and LE Pain Descriptors / Indicators: Aching, Constant, Discomfort, Dull, Guarding, Grimacing Pain Intervention(s): Limited activity within patient's tolerance, Monitored during session, Premedicated before session, Repositioned, Ice applied    Home Living Family/patient expects to be discharged to:: Private residence Living Arrangements: Alone (daughter will be able to assist at time of d/c) Available Help at Discharge: Family Type of Home: House Home Access: Stairs to enter (pt prefered entry to back of home with 2 steps requires pt to navigate > 50 feet on gravel walkway. Daughter and PT strongly recommend pt uses the front enterance to her home. pts daughter and son will be avaible to assist with step navigation) Entrance Stairs-Rails: None Entrance Stairs-Number of Steps: 2 (6-7 steps in the front of the house with B handrail. unable to reach both at the same time.)   Home Layout: One level Home Equipment: Gilmer Mor - single point;Standard Environmental consultant;Shower seat;Grab bars - tub/shower      Prior Function Prior Level of Function : Independent/Modified Independent;Driving             Mobility Comments:  IND no AD with all ADLs, self care and IADLs       Extremity/Trunk Assessment        Lower Extremity Assessment Lower Extremity Assessment: LLE deficits/detail LLE Deficits / Details: ankle DF/PF 5/5; SLR < 10 degree lag LLE Sensation: decreased proprioception    Cervical / Trunk Assessment Cervical / Trunk Assessment: Normal  Communication   Communication Communication: No apparent difficulties  Cognition Arousal: Alert Behavior During Therapy: WFL for tasks assessed/performed Overall Cognitive Status: Within Functional Limits for tasks assessed                                          General Comments      Exercises Total Joint Exercises Ankle Circles/Pumps: AROM, Both, 10 reps Quad Sets: AROM, Left, 5 reps Short Arc Quad: AROM, Left, 5 reps Heel Slides: AROM, Left, 5 reps Hip ABduction/ADduction: AROM, Left, 5 reps Straight Leg Raises: AROM, Left, 5 reps Knee Flexion: AROM, Left, 5 reps, Seated   Assessment/Plan    PT Assessment Patient needs continued PT services  PT Problem List Decreased strength;Decreased range of motion;Decreased activity tolerance;Decreased balance;Decreased mobility;Decreased coordination;Pain  PT Treatment Interventions DME instruction;Stair training;Gait training;Functional mobility training;Therapeutic exercise;Therapeutic activities;Balance training;Neuromuscular re-education;Patient/family education;Modalities    PT Goals (Current goals can be found in the Care Plan section)  Acute Rehab PT Goals Patient Stated Goal: to be able to walk to the mailbox and back, go to the grocery store, no pain for walking, drive and get back out in the yard PT Goal Formulation: With patient Time For Goal Achievement: 08/19/23 Potential to Achieve Goals: Good    Frequency 7X/week     Co-evaluation               AM-PAC PT "6 Clicks" Mobility  Outcome Measure Help needed turning from your back to your side while in  a flat bed without using bedrails?: None Help needed moving from lying on your back to sitting on the side of a flat bed without using bedrails?: None Help needed moving to and from a bed to a chair (including a wheelchair)?: A Little Help needed standing up from a chair using your arms (e.g., wheelchair or bedside chair)?: A Little Help needed to walk in hospital room?: A Little Help needed climbing 3-5 steps with a railing? : A Little 6 Click Score: 20    End of Session         PT Visit Diagnosis: Unsteadiness on feet (R26.81);Other abnormalities of gait and mobility (R26.89);Muscle weakness (generalized) (M62.81);Difficulty in walking, not elsewhere classified (R26.2);Pain Pain - Right/Left: Left Pain - part of body: Leg;Knee    Time: 6045-4098 PT Time Calculation (min) (ACUTE ONLY): 63 min   Charges:   PT Evaluation $PT Eval Low Complexity: 1 Low PT Treatments $Gait Training: 8-22 mins $Therapeutic Exercise: 8-22 mins $Therapeutic Activity: 8-22 mins PT General Charges $$ ACUTE PT VISIT: 1 Visit         Johnny Bridge, PT Acute Rehab   Jacqualyn Posey 08/05/2023, 5:42 PM

## 2023-08-05 NOTE — Interval H&P Note (Signed)
The patient has been re-examined, and the chart reviewed, and there have been no interval changes to the documented history and physical.    Plan for L TKA for L knee OA  The operative side was examined and the patient was confirmed to have sensation to DPN, SPN, TN intact, Motor EHL, ext, flex 5/5, and DP 2+, PT 2+, No significant edema.   The risks, benefits, and alternatives have been discussed at length with patient, and the patient is willing to proceed.  Left knee marked. Consent has been signed.  

## 2023-08-05 NOTE — Anesthesia Procedure Notes (Signed)
Spinal  Patient location during procedure: OR Start time: 08/05/2023 10:23 AM End time: 08/05/2023 10:28 AM Reason for block: surgical anesthesia Staffing Performed: anesthesiologist  Anesthesiologist: Lowella Curb, MD Performed by: Lowella Curb, MD Authorized by: Lowella Curb, MD   Preanesthetic Checklist Completed: patient identified, IV checked, site marked, risks and benefits discussed, surgical consent, monitors and equipment checked, pre-op evaluation and timeout performed Spinal Block Patient position: sitting Prep: DuraPrep Patient monitoring: heart rate, cardiac monitor, continuous pulse ox and blood pressure Approach: midline Location: L3-4 Injection technique: single-shot Needle Needle type: Quincke  Needle gauge: 22 G Needle length: 15 cm Assessment Sensory level: T4 Events: CSF return Additional Notes Not enough length with 9cm needle.  Successful with 15cm

## 2023-08-06 ENCOUNTER — Encounter (HOSPITAL_COMMUNITY): Payer: Self-pay | Admitting: Orthopedic Surgery

## 2023-08-06 DIAGNOSIS — M1712 Unilateral primary osteoarthritis, left knee: Secondary | ICD-10-CM | POA: Diagnosis not present

## 2023-08-06 DIAGNOSIS — Z96652 Presence of left artificial knee joint: Secondary | ICD-10-CM | POA: Diagnosis not present

## 2023-08-06 NOTE — Telephone Encounter (Signed)
Received provider form for 2025 PAP Re-enrollment.  Faxed completed for with a copy of the insurance card to [employer].

## 2023-08-07 DIAGNOSIS — M1712 Unilateral primary osteoarthritis, left knee: Secondary | ICD-10-CM | POA: Diagnosis not present

## 2023-08-08 DIAGNOSIS — M1712 Unilateral primary osteoarthritis, left knee: Secondary | ICD-10-CM | POA: Diagnosis not present

## 2023-08-09 NOTE — Telephone Encounter (Signed)
PAP: Patient assistance application for Ozempic has been approved by PAP Companies: [employer] from 08/08/2023 to 07/15/2024. Medication should be delivered to PAP Delivery: Provider's office. For further shipping updates, please [employer] at [phone]. Patient ID is: [id number]

## 2023-08-09 NOTE — Progress Notes (Signed)
Pharmacy Medication Assistance Program Note    08/09/2023  Patient ID: [name], female   DOB: 1951/02/07, 73 y.o.   MRN: [id number]     07/12/2023  Outreach Medication One  Initial Outreach Date (Medication One) 07/11/2023  Manufacturer Medication One [employer] [facility] Ozempic  Dose of Ozempic 2mg /week  Type of Radiographer, therapeutic Assistance  Date Application Sent to Prescriber 07/12/2023  Name of Prescriber Dorothyann Peng  Date Application Received From Provider 08/06/2023  Date Application Submitted to Manufacturer 08/06/2023  Method Application Sent to Manufacturer Fax  Patient Assistance Determination Approved  Approval Start Date 08/08/2023  Approval End Date 07/15/2024  Patient Notification Method MyChart     Signature Tresea Mall, CPHT/Patient [facility] Direct Line: [phone] Fax: [phone]

## 2023-08-12 ENCOUNTER — Other Ambulatory Visit: Payer: Self-pay | Admitting: Family Medicine

## 2023-08-12 DIAGNOSIS — J302 Other seasonal allergic rhinitis: Secondary | ICD-10-CM

## 2023-08-14 DIAGNOSIS — M1712 Unilateral primary osteoarthritis, left knee: Secondary | ICD-10-CM | POA: Diagnosis not present

## 2023-08-15 DIAGNOSIS — M1712 Unilateral primary osteoarthritis, left knee: Secondary | ICD-10-CM | POA: Diagnosis not present

## 2023-08-19 ENCOUNTER — Ambulatory Visit: Payer: Self-pay | Admitting: Internal Medicine

## 2023-08-20 DIAGNOSIS — M1712 Unilateral primary osteoarthritis, left knee: Secondary | ICD-10-CM | POA: Diagnosis not present

## 2023-08-21 DIAGNOSIS — M1712 Unilateral primary osteoarthritis, left knee: Secondary | ICD-10-CM | POA: Diagnosis not present

## 2023-08-22 DIAGNOSIS — M1712 Unilateral primary osteoarthritis, left knee: Secondary | ICD-10-CM | POA: Diagnosis not present

## 2023-08-27 DIAGNOSIS — M1712 Unilateral primary osteoarthritis, left knee: Secondary | ICD-10-CM | POA: Diagnosis not present

## 2023-08-29 DIAGNOSIS — M1712 Unilateral primary osteoarthritis, left knee: Secondary | ICD-10-CM | POA: Diagnosis not present

## 2023-09-03 DIAGNOSIS — M1712 Unilateral primary osteoarthritis, left knee: Secondary | ICD-10-CM | POA: Diagnosis not present

## 2023-09-03 DIAGNOSIS — M545 Low back pain, unspecified: Secondary | ICD-10-CM | POA: Diagnosis not present

## 2023-09-09 DIAGNOSIS — M1712 Unilateral primary osteoarthritis, left knee: Secondary | ICD-10-CM | POA: Diagnosis not present

## 2023-09-10 ENCOUNTER — Encounter: Payer: Self-pay | Admitting: Internal Medicine

## 2023-09-10 ENCOUNTER — Ambulatory Visit: Payer: Medicare PPO | Admitting: Internal Medicine

## 2023-09-10 VITALS — BP 126/78 | HR 87 | Temp 98.3°F | Ht 66.0 in

## 2023-09-10 DIAGNOSIS — E1169 Type 2 diabetes mellitus with other specified complication: Secondary | ICD-10-CM | POA: Diagnosis not present

## 2023-09-10 DIAGNOSIS — I119 Hypertensive heart disease without heart failure: Secondary | ICD-10-CM

## 2023-09-10 DIAGNOSIS — I700 Atherosclerosis of aorta: Secondary | ICD-10-CM

## 2023-09-10 DIAGNOSIS — E785 Hyperlipidemia, unspecified: Secondary | ICD-10-CM

## 2023-09-10 DIAGNOSIS — E2839 Other primary ovarian failure: Secondary | ICD-10-CM | POA: Diagnosis not present

## 2023-09-10 DIAGNOSIS — M5416 Radiculopathy, lumbar region: Secondary | ICD-10-CM

## 2023-09-10 MED ORDER — GABAPENTIN 100 MG PO CAPS: 100.0000 mg | ORAL_CAPSULE | Freq: Every evening | ORAL | 2 refills | Status: DC

## 2023-09-10 NOTE — Progress Notes (Signed)
 I,Victoria T Deloria Lair, CMA,acting as a Neurosurgeon for Gwynneth Aliment, MD.,have documented all relevant documentation on the behalf of Gwynneth Aliment, MD,as directed by  Gwynneth Aliment, MD while in the presence of Gwynneth Aliment, MD.  Subjective:  Patient ID: [name] , female    DOB: 01/28/1951 , 73 y.o.   MRN: [id number]  Chief Complaint  Patient presents with   Diabetes   Hypertension    HPI  Pt presents today for DM/HTN check. She reports compliance with meds. She denies having any headaches, chest pain and shortness of breath. She is accompanied by family today.  She is a month into the healing process from left knee surgery. She admits she is still experiencing pain. Rates: 6/10. She would like to be prescribed something to help manage the pain.   She plans to schedule mammogram in the near future.  Diabetes She presents for her follow-up diabetic visit. She has type 2 diabetes mellitus. Her disease course has been improving. There are no hypoglycemic associated symptoms. There are no diabetic associated symptoms. Pertinent negatives for diabetes include no blurred vision, no chest pain, no polydipsia, no polyphagia and no polyuria. There are no hypoglycemic complications. There are no diabetic complications. Risk factors for coronary artery disease include diabetes mellitus, hypertension, obesity and post-menopausal. She is compliant with treatment most of the time. She is following a generally healthy diet. She participates in exercise intermittently. Her breakfast blood glucose is taken between 8-9 am. Her breakfast blood glucose range is generally 70-90 mg/dl. Eye exam is current.  Hypertension This is a chronic problem. The current episode started more than 1 year ago. The problem is unchanged. The problem is controlled. Pertinent negatives include no blurred vision, chest pain, orthopnea, palpitations, PND or shortness of breath. The current treatment provides moderate improvement.      Past Medical History:  Diagnosis Date   Allergy 1995   Arthritis 1990   Diabetes mellitus without complication (HCC)    Encounter for annual health examination 04/17/2023   GERD (gastroesophageal reflux disease)    High cholesterol    Hypertension    Sleep apnea    mild- no CPAP   Stroke (HCC) 07/17/2003   mini- stroke due to HTN     Family History  Problem Relation Age of Onset   Cancer Mother    Hypertension Mother    Cancer Maternal Grandmother    Hypertension Maternal Grandmother    Arthritis Maternal Grandmother    Cancer Sister      Current Outpatient Medications:    gabapentin (NEURONTIN) 100 MG capsule, Take 1 capsule (100 mg total) by mouth at bedtime., Disp: 30 capsule, Rfl: 2   Alcohol Swabs (DROPSAFE ALCOHOL PREP) 70 % PADS, USE AS DIRECTED WHEN CHECKING BLOOD SUGARS, Disp: 300 each, Rfl: 3   atorvastatin (LIPITOR) 10 MG tablet, Take one tab po daily M-Friday and skip Saturday and Sundays, Disp: 90 tablet, Rfl: 1   Blood Glucose Calibration (TRUE METRIX LEVEL 1) Low SOLN, USE AS DIRECTED WITH GLUCOSE METER, Disp: 1 each, Rfl: 3   Blood Glucose Monitoring Suppl (TRUE METRIX AIR GLUCOSE METER) w/Device KIT, Inject 1 kit into the skin daily. Use as directed to check blood sugars 1 time per dx: e11.22, Disp: 1 kit, Rfl: 1   Cholecalciferol (VITAMIN D) 2000 UNITS tablet, Take 2,000 Units by mouth daily. , Disp: , Rfl:    glucose blood (TRUE METRIX BLOOD GLUCOSE TEST) test strip, USE AS INSTRUCTED,  Disp: 300 strip, Rfl: 3   latanoprost (XALATAN) 0.005 % ophthalmic solution, Place 1 drop into both eyes at bedtime., Disp: , Rfl:    levocetirizine (XYZAL) 5 MG tablet, Take 5 mg by mouth daily as needed for allergies., Disp: , Rfl:    metFORMIN (GLUCOPHAGE-XR) 750 MG 24 hr tablet, TAKE 1 TABLET EVERY DAY WITH EVENING MEAL, Disp: 90 tablet, Rfl: 3   montelukast (SINGULAIR) 10 MG tablet, TAKE 1 TABLET(10 MG) BY MOUTH DAILY, Disp: 30 tablet, Rfl: 2   Multiple Vitamin  (MULTIVITAMIN WITH MINERALS) TABS tablet, Take 1 tablet by mouth daily., Disp: , Rfl:    Olmesartan-amLODIPine-HCTZ 40-5-12.5 MG TABS, Take 1 tablet by mouth daily., Disp: 90 tablet, Rfl: 3   omeprazole (PRILOSEC) 40 MG capsule, Take 1 capsule (40 mg total) by mouth daily for 21 days., Disp: 21 capsule, Rfl: 0   polyethylene glycol (MIRALAX) 17 g packet, Take 17 g by mouth daily., Disp: 14 each, Rfl: 0   Semaglutide, 2 MG/DOSE, (OZEMPIC, 2 MG/DOSE,) 8 MG/3ML SOPN, Inject 2 mg into the skin once a week., Disp: 3 mL, Rfl: 3   tiZANidine (ZANAFLEX) 4 MG tablet, Take 1 tablet (4 mg total) by mouth daily as needed for muscle spasms., Disp: 30 tablet, Rfl: 0   Triamcinolone Acetonide (NASACORT AQ NA), Place 1-2 sprays into both nostrils daily., Disp: , Rfl:    TRUEplus Lancets 33G MISC, USE AS DIRECTED, Disp: 300 each, Rfl: 3   Allergies  Allergen Reactions   Codeine Nausea And Vomiting and Diarrhea   Meperidine     Other reaction(s): Delusions (intolerance)     Review of Systems  Constitutional: Negative.   Eyes:  Negative for blurred vision.  Respiratory: Negative.  Negative for shortness of breath.   Cardiovascular: Negative.  Negative for chest pain, palpitations, orthopnea and PND.  Gastrointestinal: Negative.   Endocrine: Negative for polydipsia, polyphagia and polyuria.  Musculoskeletal:  Positive for arthralgias and back pain.  Neurological: Negative.   Psychiatric/Behavioral: Negative.       Today's Vitals   09/10/23 1540  BP: 126/78  Pulse: 87  Temp: 98.3 F (36.8 C)  SpO2: 98%  Height: 5\' 6"  (1.676 m)   Body mass index is 39.35 kg/m.  Wt Readings from Last 3 Encounters:  08/05/23 243 lb 12.8 oz (110.6 kg)  06/12/23 248 lb 6.4 oz (112.7 kg)  05/15/23 236 lb (107 kg)     Objective:  Physical Exam Vitals and nursing note reviewed.  Constitutional:      Appearance: Normal appearance. She is obese.     Comments: Examined while in wheelchair  HENT:     Head:  Normocephalic and atraumatic.  Eyes:     Extraocular Movements: Extraocular movements intact.  Cardiovascular:     Rate and Rhythm: Normal rate and regular rhythm.     Heart sounds: Normal heart sounds.  Pulmonary:     Effort: Pulmonary effort is normal.     Breath sounds: Normal breath sounds.  Musculoskeletal:     Cervical back: Normal range of motion.  Skin:    General: Skin is warm.  Neurological:     General: No focal deficit present.     Mental Status: She is alert.  Psychiatric:        Mood and Affect: Mood normal.        Behavior: Behavior normal.         Assessment And Plan:  Dyslipidemia associated with type 2 diabetes mellitus (HCC) Assessment &  Plan: Chronic, she is currently taking metformin XR 750mg  daily along with semaglutide 2mg  weekly. I will check labs as below. Regarding lipids, LDL goal is less than 70.  She will continue with atorvastatin 10mg  daily M-F.  Orders: -     Hemoglobin A1c -     BMP8+EGFR  Hypertensive heart disease without heart failure Assessment & Plan: Chronic, controlled. She will continue with olmesartan/amlodipine/hydrochlorothiazide 40/5/12.5mg  daily.  She is reminded to follow a low sodium diet.    Aortic atherosclerosis (HCC) Assessment & Plan: Chronic, LDL goal is less than 70.  She is not currently on statin therapy. She will continue with atorvastatin 10mg  M-F.  She does not tolerate daily dosing.       Estrogen deficiency Assessment & Plan: She is scheduled for bone density in April 2025.     Lumbar radiculopathy Assessment & Plan: Chronic, unfortunately this pain is worse than her knee pain. She has pain radiating to her extremities. She agrees to start gabapentin 100mg  nightly.  She is advised that she may increase to 2 capsules after 3-5 days.  If needed, we can further increase nighttime dose as needed OR change to BID dosing.    Other orders -     Gabapentin; Take 1 capsule (100 mg total) by mouth at bedtime.   Dispense: 30 capsule; Refill: 2     Return in 3 months (on 12/08/2023), or dm check.  Patient was given opportunity to ask questions. Patient verbalized understanding of the plan and was able to repeat key elements of the plan. All questions were answered to their satisfaction.    I, Gwynneth Aliment, MD, have reviewed all documentation for this visit. The documentation on 09/10/23 for the exam, diagnosis, procedures, and orders are all accurate and complete.   IF YOU HAVE BEEN REFERRED TO A SPECIALIST, IT MAY TAKE 1-2 WEEKS TO SCHEDULE/PROCESS THE REFERRAL. IF YOU HAVE NOT HEARD FROM US/SPECIALIST IN TWO WEEKS, PLEASE GIVE [place] A CALL AT [phone].   THE PATIENT IS ENCOURAGED TO PRACTICE SOCIAL DISTANCING DUE TO THE COVID-19 PANDEMIC.

## 2023-09-10 NOTE — Patient Instructions (Signed)

## 2023-09-11 LAB — BMP8+EGFR
BUN/Creatinine Ratio: 25 (ref 12–28)
BUN: 18 mg/dL (ref 8–27)
CO2: 24 mmol/L (ref 20–29)
Calcium: 9.5 mg/dL (ref 8.7–10.3)
Chloride: 104 mmol/L (ref 96–106)
Creatinine, Ser: 0.71 mg/dL (ref 0.57–1.00)
Glucose: 110 mg/dL — ABNORMAL HIGH (ref 70–99)
Potassium: 4.1 mmol/L (ref 3.5–5.2)
Sodium: 143 mmol/L (ref 134–144)
eGFR: 90 mL/min/{1.73_m2} (ref >59)

## 2023-09-11 LAB — HEMOGLOBIN A1C
Est. average glucose Bld gHb Est-mCnc: 120 mg/dL
Hgb A1c MFr Bld: 5.8 % — ABNORMAL HIGH (ref 4.8–5.6)

## 2023-09-12 ENCOUNTER — Other Ambulatory Visit: Payer: Self-pay | Admitting: Internal Medicine

## 2023-09-12 MED ORDER — TIZANIDINE HCL 4 MG PO TABS: 4.0000 mg | ORAL_TABLET | Freq: Every day | ORAL | 0 refills | Status: DC | PRN

## 2023-09-13 DIAGNOSIS — M5416 Radiculopathy, lumbar region: Secondary | ICD-10-CM | POA: Diagnosis not present

## 2023-09-17 DIAGNOSIS — M5416 Radiculopathy, lumbar region: Secondary | ICD-10-CM | POA: Insufficient documentation

## 2023-09-17 DIAGNOSIS — M1712 Unilateral primary osteoarthritis, left knee: Secondary | ICD-10-CM | POA: Diagnosis not present

## 2023-09-17 DIAGNOSIS — I119 Hypertensive heart disease without heart failure: Secondary | ICD-10-CM | POA: Insufficient documentation

## 2023-09-17 NOTE — Assessment & Plan Note (Signed)
 Chronic, unfortunately this pain is worse than her knee pain. She has pain radiating to her extremities. She agrees to start gabapentin 100mg  nightly.  She is advised that she may increase to 2 capsules after 3-5 days.  If needed, we can further increase nighttime dose as needed OR change to BID dosing.

## 2023-09-17 NOTE — Assessment & Plan Note (Signed)
 She is scheduled for bone density in April 2025.

## 2023-09-17 NOTE — Assessment & Plan Note (Signed)
 Chronic, controlled. She will continue with olmesartan/amlodipine/hydrochlorothiazide 40/5/12.5mg  daily.  She is reminded to follow a low sodium diet.

## 2023-09-17 NOTE — Assessment & Plan Note (Signed)
 Chronic, she is currently taking metformin XR 750mg  daily along with semaglutide 2mg  weekly. I will check labs as below. Regarding lipids, LDL goal is less than 70.  She will continue with atorvastatin 10mg  daily M-F.

## 2023-09-17 NOTE — Assessment & Plan Note (Addendum)
 Chronic, LDL goal is less than 70.  She is not currently on statin therapy. She will continue with atorvastatin 10mg  M-F.  She does not tolerate daily dosing.

## 2023-09-24 DIAGNOSIS — M1712 Unilateral primary osteoarthritis, left knee: Secondary | ICD-10-CM | POA: Diagnosis not present

## 2023-09-26 DIAGNOSIS — M1712 Unilateral primary osteoarthritis, left knee: Secondary | ICD-10-CM | POA: Diagnosis not present

## 2023-10-01 DIAGNOSIS — M1712 Unilateral primary osteoarthritis, left knee: Secondary | ICD-10-CM | POA: Diagnosis not present

## 2023-10-03 ENCOUNTER — Other Ambulatory Visit: Payer: Self-pay | Admitting: Internal Medicine

## 2023-10-03 DIAGNOSIS — Z1231 Encounter for screening mammogram for malignant neoplasm of breast: Secondary | ICD-10-CM

## 2023-10-03 DIAGNOSIS — M1712 Unilateral primary osteoarthritis, left knee: Secondary | ICD-10-CM | POA: Diagnosis not present

## 2023-10-07 DIAGNOSIS — M5416 Radiculopathy, lumbar region: Secondary | ICD-10-CM | POA: Diagnosis not present

## 2023-10-08 DIAGNOSIS — M1712 Unilateral primary osteoarthritis, left knee: Secondary | ICD-10-CM | POA: Diagnosis not present

## 2023-10-11 ENCOUNTER — Other Ambulatory Visit: Payer: Self-pay | Admitting: Internal Medicine

## 2023-10-14 DIAGNOSIS — M5451 Vertebrogenic low back pain: Secondary | ICD-10-CM | POA: Diagnosis not present

## 2023-10-14 DIAGNOSIS — M48062 Spinal stenosis, lumbar region with neurogenic claudication: Secondary | ICD-10-CM | POA: Diagnosis not present

## 2023-10-15 ENCOUNTER — Other Ambulatory Visit: Payer: Self-pay | Admitting: Internal Medicine

## 2023-10-18 DIAGNOSIS — M5459 Other low back pain: Secondary | ICD-10-CM | POA: Diagnosis not present

## 2023-10-29 DIAGNOSIS — M1712 Unilateral primary osteoarthritis, left knee: Secondary | ICD-10-CM | POA: Diagnosis not present

## 2023-11-04 ENCOUNTER — Ambulatory Visit
Admission: RE | Admit: 2023-11-04 | Discharge: 2023-11-04 | Disposition: A | Discharge status: Home / Self Care | Source: Ambulatory Visit | Attending: Internal Medicine | Admitting: Internal Medicine

## 2023-11-04 DIAGNOSIS — Z1231 Encounter for screening mammogram for malignant neoplasm of breast: Secondary | ICD-10-CM

## 2023-11-11 ENCOUNTER — Encounter: Payer: Self-pay | Admitting: Internal Medicine

## 2023-11-11 ENCOUNTER — Ambulatory Visit
Admission: RE | Admit: 2023-11-11 | Discharge: 2023-11-11 | Disposition: A | Discharge status: Home / Self Care | Payer: Medicare PPO | Source: Ambulatory Visit | Attending: Internal Medicine | Admitting: Internal Medicine

## 2023-11-11 DIAGNOSIS — E2839 Other primary ovarian failure: Secondary | ICD-10-CM

## 2023-11-11 DIAGNOSIS — N958 Other specified menopausal and perimenopausal disorders: Secondary | ICD-10-CM | POA: Diagnosis not present

## 2023-11-12 DIAGNOSIS — M5416 Radiculopathy, lumbar region: Secondary | ICD-10-CM | POA: Diagnosis not present

## 2023-11-15 DIAGNOSIS — M545 Low back pain, unspecified: Secondary | ICD-10-CM | POA: Diagnosis not present

## 2023-11-21 DIAGNOSIS — Z96652 Presence of left artificial knee joint: Secondary | ICD-10-CM | POA: Diagnosis not present

## 2023-11-26 DIAGNOSIS — M5416 Radiculopathy, lumbar region: Secondary | ICD-10-CM | POA: Diagnosis not present

## 2023-12-05 ENCOUNTER — Other Ambulatory Visit: Payer: Self-pay | Admitting: Internal Medicine

## 2023-12-16 ENCOUNTER — Encounter: Payer: Self-pay | Admitting: Internal Medicine

## 2023-12-16 ENCOUNTER — Ambulatory Visit: Payer: Medicare PPO | Admitting: Internal Medicine

## 2023-12-16 ENCOUNTER — Other Ambulatory Visit: Payer: Self-pay | Admitting: Internal Medicine

## 2023-12-16 VITALS — BP 128/84 | HR 87 | Temp 98.2°F | Ht 66.0 in | Wt 245.6 lb

## 2023-12-16 DIAGNOSIS — M5416 Radiculopathy, lumbar region: Secondary | ICD-10-CM | POA: Diagnosis not present

## 2023-12-16 DIAGNOSIS — Z9989 Dependence on other enabling machines and devices: Secondary | ICD-10-CM

## 2023-12-16 DIAGNOSIS — I119 Hypertensive heart disease without heart failure: Secondary | ICD-10-CM | POA: Diagnosis not present

## 2023-12-16 DIAGNOSIS — I359 Nonrheumatic aortic valve disorder, unspecified: Secondary | ICD-10-CM | POA: Diagnosis not present

## 2023-12-16 DIAGNOSIS — I700 Atherosclerosis of aorta: Secondary | ICD-10-CM

## 2023-12-16 DIAGNOSIS — E1169 Type 2 diabetes mellitus with other specified complication: Secondary | ICD-10-CM | POA: Diagnosis not present

## 2023-12-16 DIAGNOSIS — M25362 Other instability, left knee: Secondary | ICD-10-CM | POA: Diagnosis not present

## 2023-12-16 DIAGNOSIS — E785 Hyperlipidemia, unspecified: Secondary | ICD-10-CM

## 2023-12-16 NOTE — Assessment & Plan Note (Signed)
 Chronic, LDL goal is less than 70.  She is not currently on statin therapy. She will continue with atorvastatin 10mg  M-F.  She does not tolerate daily dosing.

## 2023-12-16 NOTE — Assessment & Plan Note (Signed)
 Chronic, she is currently taking metformin XR 750mg  daily along with semaglutide 2mg  weekly. I will check labs as below. Regarding lipids, LDL goal is less than 70.  She will continue with atorvastatin 10mg  daily M-F.

## 2023-12-16 NOTE — Progress Notes (Unsigned)
 I,[name], CMA,acting as a Neurosurgeon for Grace Dung, MD.,have documented all relevant documentation on the behalf of Grace Dung, MD,as directed by  Grace Dung, MD while in the presence of Grace Dung, MD.  Subjective:  Patient ID: [name] , female    DOB: [birth year] , 73 y.o.   MRN: [id number]  Chief Complaint  Patient presents with   Diabetes    Patient presents today for a DM check. Patient doesn't have any specific questions or concerns. Denies headaches,chest pain & sob   Hypertension    HPI Discussed the use of AI scribe software for clinical note transcription with the patient, who gave verbal consent to proceed.  History of Present Illness [name] is a 73 year old female who presents with issues related to her knee and blood sugar management. She was brought to the appointment by her son.  Blood sugar levels have been ranging from 88 to 110 mg/dL. She is currently receiving injections for management, although there was a delay in communication regarding the referral for these treatments.  She has been experiencing knee issues following a procedure. A new orthopedic doctor indicated that incorrect padding may have been inserted, affecting her ability to walk. Her physical therapist noted that the issue is not due to her efforts but may be related to a problem with the knee padding. She is scheduled to see the original surgeon to discuss this further. Another orthopedic specialist suggested the padding issue might be related to sizing. Her daughter is upset with the delay in resolving the knee issue, and her physical therapist has noted that she is working too hard to walk and is not progressing as expected.  Regarding her back, she has seen an orthopedic specialist for evaluation. She has not pursued surgery for her back due to her previous experience with knee surgery and is not in a rush to have back surgery unless absolutely necessary.  She has not  experienced recent symptoms of Raynaud's phenomenon, which previously caused her hands to turn colors, likely due to the change in season.  She has not been able to maintain her garden due to her mobility issues, which has impacted her mental health. Her son is supportive and has suggested alternative gardening methods to accommodate her current physical limitations.   Diabetes She presents for her follow-up diabetic visit. She has type 2 diabetes mellitus. Her disease course has been improving. There are no hypoglycemic associated symptoms. There are no diabetic associated symptoms. Pertinent negatives for diabetes include no blurred vision, no chest pain, no polydipsia, no polyphagia and no polyuria. There are no hypoglycemic complications. There are no diabetic complications. Risk factors for coronary artery disease include diabetes mellitus, hypertension, obesity and post-menopausal. She is compliant with treatment most of the time. She is following a generally healthy diet. She participates in exercise intermittently. Her breakfast blood glucose is taken between 8-9 am. Her breakfast blood glucose range is generally 70-90 mg/dl. Eye exam is current.  Hypertension This is a chronic problem. The current episode started more than 1 year ago. The problem is unchanged. The problem is controlled. Pertinent negatives include no blurred vision, chest pain, orthopnea, palpitations, PND or shortness of breath. The current treatment provides moderate improvement.     Past Medical History:  Diagnosis Date   Allergy 1995   Arthritis 1990   Diabetes mellitus without complication (HCC)    Encounter for annual health examination 04/17/2023   GERD (  gastroesophageal reflux disease)    High cholesterol    Hypertension    Sleep apnea    mild- no CPAP   Stroke (HCC) 07/17/2003   mini- stroke due to HTN     Family History  Problem Relation Age of Onset   Cancer Mother    Hypertension Mother    Cancer  Maternal Grandmother    Hypertension Maternal Grandmother    Arthritis Maternal Grandmother    Cancer Sister      Current Outpatient Medications:    Alcohol  Swabs (DROPSAFE ALCOHOL  PREP) 70 % PADS, USE AS DIRECTED WHEN CHECKING BLOOD SUGARS, Disp: 300 each, Rfl: 3   atorvastatin  (LIPITOR) 10 MG tablet, Take one tab po daily M-Friday and skip Saturday and Sundays, Disp: 90 tablet, Rfl: 1   Blood Glucose Calibration (TRUE METRIX LEVEL 1) Low SOLN, USE AS DIRECTED WITH GLUCOSE METER, Disp: 1 each, Rfl: 3   Blood Glucose Monitoring Suppl (TRUE METRIX AIR GLUCOSE METER) w/Device KIT, Inject 1 kit into the skin daily. Use as directed to check blood sugars 1 time per dx: e11.22, Disp: 1 kit, Rfl: 1   Cholecalciferol (VITAMIN D) 2000 UNITS tablet, Take 2,000 Units by mouth daily. , Disp: , Rfl:    gabapentin  (NEURONTIN ) 100 MG capsule, TAKE 1 CAPSULE(100 MG) BY MOUTH AT BEDTIME, Disp: 30 capsule, Rfl: 2   glucose blood (TRUE METRIX BLOOD GLUCOSE TEST) test strip, USE AS INSTRUCTED, Disp: 300 strip, Rfl: 3   latanoprost (XALATAN) 0.005 % ophthalmic solution, Place 1 drop into both eyes at bedtime., Disp: , Rfl:    levocetirizine (XYZAL) 5 MG tablet, Take 5 mg by mouth daily as needed for allergies., Disp: , Rfl:    metFORMIN  (GLUCOPHAGE -XR) 750 MG 24 hr tablet, TAKE 1 TABLET EVERY DAY WITH EVENING MEAL, Disp: 90 tablet, Rfl: 3   montelukast  (SINGULAIR ) 10 MG tablet, TAKE 1 TABLET(10 MG) BY MOUTH DAILY, Disp: 30 tablet, Rfl: 2   Multiple Vitamin (MULTIVITAMIN WITH MINERALS) TABS tablet, Take 1 tablet by mouth daily., Disp: , Rfl:    Olmesartan -amLODIPine -HCTZ 40-5-12.5 MG TABS, TAKE 1 TABLET EVERY DAY, Disp: 90 tablet, Rfl: 3   polyethylene glycol (MIRALAX ) 17 g packet, Take 17 g by mouth daily., Disp: 14 each, Rfl: 0   Semaglutide , 2 MG/DOSE, (OZEMPIC , 2 MG/DOSE,) 8 MG/3ML SOPN, Inject 2 mg into the skin once a week., Disp: 3 mL, Rfl: 3   tiZANidine  (ZANAFLEX ) 4 MG tablet, TAKE 1 TABLET(4 MG) BY MOUTH  DAILY AS NEEDED FOR MUSCLE SPASMS, Disp: 30 tablet, Rfl: 0   Triamcinolone  Acetonide (NASACORT  AQ NA), Place 1-2 sprays into both nostrils daily., Disp: , Rfl:    TRUEplus Lancets 33G MISC, USE AS DIRECTED, Disp: 300 each, Rfl: 3   omeprazole  (PRILOSEC) 40 MG capsule, Take 1 capsule (40 mg total) by mouth daily for 21 days., Disp: 21 capsule, Rfl: 0   Allergies  Allergen Reactions   Codeine Nausea And Vomiting and Diarrhea   Meperidine      Other reaction(s): Delusions (intolerance)     Review of Systems  Constitutional: Negative.   Eyes:  Negative for blurred vision.  Respiratory: Negative.  Negative for shortness of breath.   Cardiovascular: Negative.  Negative for chest pain, palpitations, orthopnea and PND.  Gastrointestinal: Negative.   Endocrine: Negative for polydipsia, polyphagia and polyuria.  Neurological: Negative.   Psychiatric/Behavioral: Negative.       Today's Vitals   12/16/23 1420 12/16/23 1458  BP: (!) 142/92 128/84  Pulse: 87  Temp: 98.2 F (36.8 C)   SpO2: 98%   Weight: 245 lb 9.6 oz (111.4 kg)   Height: 5\' 6"  (1.676 m)    Body mass index is 39.64 kg/m.  Wt Readings from Last 3 Encounters:  12/16/23 245 lb 9.6 oz (111.4 kg)  08/05/23 243 lb 12.8 oz (110.6 kg)  06/12/23 248 lb 6.4 oz (112.7 kg)     Objective:  Physical Exam Vitals and nursing note reviewed.  Constitutional:      Appearance: Normal appearance. She is obese.  HENT:     Head: Normocephalic and atraumatic.  Eyes:     Extraocular Movements: Extraocular movements intact.  Cardiovascular:     Rate and Rhythm: Normal rate and regular rhythm.     Heart sounds: Normal heart sounds.  Pulmonary:     Effort: Pulmonary effort is normal.     Breath sounds: Normal breath sounds.  Musculoskeletal:     Cervical back: Normal range of motion.     Comments: Ambulatory w/ walker  Skin:    General: Skin is warm.  Neurological:     General: No focal deficit present.     Mental Status: She is  alert.  Psychiatric:        Mood and Affect: Mood normal.        Behavior: Behavior normal.       Assessment And Plan:  Dyslipidemia associated with type 2 diabetes mellitus (HCC) Assessment & Plan: Chronic, she is currently taking metformin  XR 750mg  daily along with semaglutide  2mg  weekly. I will check labs as below. Regarding lipids, LDL goal is less than 70.  She will continue with atorvastatin  10mg  daily M-F.  Orders: -     CMP14+EGFR -     Hemoglobin A1c  Hypertensive heart disease without heart failure Assessment & Plan: Chronic, controlled. She will continue with olmesartan /amlodipine /hydrochlorothiazide 40/5/12.5mg  daily.  She is reminded to follow a low sodium diet.    Aortic atherosclerosis (HCC) Assessment & Plan: Chronic, LDL goal is less than 70.  She is not currently on statin therapy. She will continue with atorvastatin  10mg  M-F.  She does not tolerate daily dosing.       Lumbar radiculopathy  Instability of left knee joint  Walker as ambulation aid  Aortic valve calcification -     ECHOCARDIOGRAM COMPLETE; Future   Assessment & Plan Knee joint instability Chronic instability due to incorrect padding size, causing persistent walking difficulty. - Consult Dr. Jennell Moccasin on June 24 for corrective measures. - Consider further evaluation with Dr. Hazel Listen. - Select orthopedic surgeon for surgery.  Hypertension Well-controlled with BP 128/84 mmHg.  Diabetes mellitus without complication Well-controlled with blood glucose 88-110 mg/dL. - Continue current diabetes management plan.  Return if symptoms worsen or fail to improve.  Patient was given opportunity to ask questions. Patient verbalized understanding of the plan and was able to repeat key elements of the plan. All questions were answered to their satisfaction.   I, Grace Dung, MD, have reviewed all documentation for this visit. The documentation on 12/16/23 for the exam, diagnosis, procedures,  and orders are all accurate and complete.   IF YOU HAVE BEEN REFERRED TO A SPECIALIST, IT MAY TAKE 1-2 WEEKS TO SCHEDULE/PROCESS THE REFERRAL. IF YOU HAVE NOT HEARD FROM US /SPECIALIST IN TWO WEEKS, PLEASE GIVE US  A CALL AT [phone].   THE PATIENT IS ENCOURAGED TO PRACTICE SOCIAL DISTANCING DUE TO THE COVID-19 PANDEMIC.

## 2023-12-16 NOTE — Assessment & Plan Note (Signed)
 Chronic, controlled. She will continue with olmesartan/amlodipine/hydrochlorothiazide 40/5/12.5mg  daily.  She is reminded to follow a low sodium diet.

## 2023-12-16 NOTE — Patient Instructions (Signed)

## 2023-12-17 LAB — CMP14+EGFR
ALT: 15 IU/L (ref 0–32)
AST: 19 IU/L (ref 0–40)
Albumin: 4.2 g/dL (ref 3.8–4.8)
Alkaline Phosphatase: 118 IU/L (ref 44–121)
BUN/Creatinine Ratio: 26 (ref 12–28)
BUN: 18 mg/dL (ref 8–27)
Bilirubin Total: <0.2 mg/dL (ref 0.0–1.2)
CO2: 24 mmol/L (ref 20–29)
Calcium: 9.8 mg/dL (ref 8.7–10.3)
Chloride: 102 mmol/L (ref 96–106)
Creatinine, Ser: 0.69 mg/dL (ref 0.57–1.00)
Globulin, Total: 2.4 g/dL (ref 1.5–4.5)
Glucose: 115 mg/dL — ABNORMAL HIGH (ref 70–99)
Potassium: 4.5 mmol/L (ref 3.5–5.2)
Sodium: 141 mmol/L (ref 134–144)
Total Protein: 6.6 g/dL (ref 6.0–8.5)
eGFR: 92 mL/min/{1.73_m2} (ref >59)

## 2023-12-17 LAB — HEMOGLOBIN A1C
Est. average glucose Bld gHb Est-mCnc: 120 mg/dL
Hgb A1c MFr Bld: 5.8 % — ABNORMAL HIGH (ref 4.8–5.6)

## 2023-12-18 ENCOUNTER — Ambulatory Visit: Payer: Self-pay | Admitting: Internal Medicine

## 2023-12-19 NOTE — Assessment & Plan Note (Signed)
 Chronic, now followed by Dr. Ibazebo.   -Defer management to Dr. Ibazebo - May benefit from physical therapy

## 2023-12-19 NOTE — Assessment & Plan Note (Signed)
 She is in agreement with getting another opinion from another orthopedist.  - Refer to [facility] for further evaluation

## 2023-12-20 ENCOUNTER — Encounter: Payer: Self-pay | Admitting: Internal Medicine

## 2023-12-26 ENCOUNTER — Encounter: Payer: Self-pay | Admitting: Internal Medicine

## 2023-12-26 DIAGNOSIS — M5416 Radiculopathy, lumbar region: Secondary | ICD-10-CM | POA: Diagnosis not present

## 2024-01-01 DIAGNOSIS — M5416 Radiculopathy, lumbar region: Secondary | ICD-10-CM | POA: Diagnosis not present

## 2024-01-07 DIAGNOSIS — M1712 Unilateral primary osteoarthritis, left knee: Secondary | ICD-10-CM | POA: Diagnosis not present

## 2024-01-08 ENCOUNTER — Encounter: Payer: Self-pay | Admitting: Internal Medicine

## 2024-01-13 DIAGNOSIS — M5459 Other low back pain: Secondary | ICD-10-CM | POA: Diagnosis not present

## 2024-01-20 DIAGNOSIS — M5459 Other low back pain: Secondary | ICD-10-CM | POA: Diagnosis not present

## 2024-01-22 ENCOUNTER — Encounter: Payer: Self-pay | Admitting: Internal Medicine

## 2024-01-22 ENCOUNTER — Encounter (HOSPITAL_COMMUNITY): Payer: Self-pay

## 2024-01-22 ENCOUNTER — Ambulatory Visit (HOSPITAL_COMMUNITY): Admission: RE | Admit: 2024-01-22 | Source: Ambulatory Visit

## 2024-01-29 ENCOUNTER — Ambulatory Visit (HOSPITAL_COMMUNITY)
Admission: RE | Admit: 2024-01-29 | Discharge: 2024-01-29 | Disposition: A | Discharge status: Home / Self Care | Source: Ambulatory Visit | Attending: Cardiovascular Disease | Admitting: Cardiovascular Disease

## 2024-01-29 DIAGNOSIS — I359 Nonrheumatic aortic valve disorder, unspecified: Secondary | ICD-10-CM | POA: Diagnosis not present

## 2024-01-29 LAB — ECHOCARDIOGRAM COMPLETE
Area-P 1/2: 3.56 cm2
S' Lateral: 1.9 cm

## 2024-01-30 DIAGNOSIS — M5459 Other low back pain: Secondary | ICD-10-CM | POA: Diagnosis not present

## 2024-02-03 DIAGNOSIS — H25813 Combined forms of age-related cataract, bilateral: Secondary | ICD-10-CM | POA: Diagnosis not present

## 2024-02-03 DIAGNOSIS — E119 Type 2 diabetes mellitus without complications: Secondary | ICD-10-CM | POA: Diagnosis not present

## 2024-02-03 DIAGNOSIS — H35033 Hypertensive retinopathy, bilateral: Secondary | ICD-10-CM | POA: Diagnosis not present

## 2024-02-03 DIAGNOSIS — H40023 Open angle with borderline findings, high risk, bilateral: Secondary | ICD-10-CM | POA: Diagnosis not present

## 2024-02-06 DIAGNOSIS — M5459 Other low back pain: Secondary | ICD-10-CM | POA: Diagnosis not present

## 2024-02-11 DIAGNOSIS — M5459 Other low back pain: Secondary | ICD-10-CM | POA: Diagnosis not present

## 2024-02-11 DIAGNOSIS — M5416 Radiculopathy, lumbar region: Secondary | ICD-10-CM | POA: Diagnosis not present

## 2024-02-27 ENCOUNTER — Encounter: Payer: Self-pay | Admitting: Internal Medicine

## 2024-02-27 DIAGNOSIS — M5459 Other low back pain: Secondary | ICD-10-CM | POA: Diagnosis not present

## 2024-03-04 ENCOUNTER — Ambulatory Visit (INDEPENDENT_AMBULATORY_CARE_PROVIDER_SITE_OTHER): Payer: Medicare PPO

## 2024-03-04 VITALS — BP 128/80 | HR 91 | Temp 98.4°F | Ht 66.0 in | Wt 244.6 lb

## 2024-03-04 DIAGNOSIS — Z0000 Encounter for general adult medical examination without abnormal findings: Secondary | ICD-10-CM

## 2024-03-04 NOTE — Progress Notes (Signed)
 Subjective:   [name] is a 73 y.o. who presents for a Medicare Wellness preventive visit.  As a reminder, Annual Wellness Visits don't include a physical exam, and some assessments may be limited, especially if this visit is performed virtually. We may recommend an in-person follow-up visit with your provider if needed.  Visit Complete: In person    Persons Participating in Visit: Patient.  AWV Questionnaire: Yes: Patient Medicare AWV questionnaire was completed by the patient on 02/29/2024; I have confirmed that all information answered by patient is correct and no changes since this date.  Cardiac Risk Factors include: advanced age (>66men, >6 women);diabetes mellitus;dyslipidemia;hypertension     Objective:    Today's Vitals   03/04/24 1105 03/04/24 1117  BP: 139/82 128/80  Pulse: 91   Temp: 98.4 F (36.9 C)   TempSrc: Oral   SpO2: 94%   Weight: 244 lb 9.6 oz (110.9 kg)   Height: 5' 6 (1.676 m)   PainSc: 5     Body mass index is 39.48 kg/m.     03/04/2024   11:11 AM 07/31/2023   12:50 PM 02/21/2023   11:56 AM 02/15/2022    2:12 PM 01/19/2021    2:32 PM 12/30/2019    2:54 PM 01/06/2019   10:59 AM  Advanced Directives  Does Patient Have a Medical Advance Directive? No No No No No No No  Would patient like information on creating a medical advance directive?   No - Patient declined No - Patient declined No - Patient declined Yes (MAU/Ambulatory/Procedural Areas - Information given) No - Patient declined      Data saved with a previous flowsheet row definition    Current Medications (verified) Outpatient Encounter Medications as of 03/04/2024  Medication Sig   Alcohol  Swabs (DROPSAFE ALCOHOL  PREP) 70 % PADS USE AS DIRECTED WHEN CHECKING BLOOD SUGARS   atorvastatin  (LIPITOR) 10 MG tablet Take one tab po daily M-Friday and skip Saturday and Sundays   Blood Glucose Calibration (TRUE METRIX LEVEL 1) Low SOLN USE AS DIRECTED WITH GLUCOSE METER   Blood Glucose  Monitoring Suppl (TRUE METRIX AIR GLUCOSE METER) w/Device KIT Inject 1 kit into the skin daily. Use as directed to check blood sugars 1 time per dx: e11.22   Cholecalciferol (VITAMIN D) 2000 UNITS tablet Take 2,000 Units by mouth daily.    gabapentin  (NEURONTIN ) 100 MG capsule TAKE 1 CAPSULE(100 MG) BY MOUTH AT BEDTIME   glucose blood (TRUE METRIX BLOOD GLUCOSE TEST) test strip USE AS INSTRUCTED   latanoprost (XALATAN) 0.005 % ophthalmic solution Place 1 drop into both eyes at bedtime.   levocetirizine (XYZAL) 5 MG tablet Take 5 mg by mouth daily as needed for allergies.   metFORMIN  (GLUCOPHAGE -XR) 750 MG 24 hr tablet TAKE 1 TABLET EVERY DAY WITH EVENING MEAL   montelukast  (SINGULAIR ) 10 MG tablet TAKE 1 TABLET(10 MG) BY MOUTH DAILY   Multiple Vitamin (MULTIVITAMIN WITH MINERALS) TABS tablet Take 1 tablet by mouth daily.   Olmesartan -amLODIPine -HCTZ 40-5-12.5 MG TABS TAKE 1 TABLET EVERY DAY   omeprazole  (PRILOSEC) 40 MG capsule Take 1 capsule (40 mg total) by mouth daily for 21 days.   Semaglutide , 2 MG/DOSE, (OZEMPIC , 2 MG/DOSE,) 8 MG/3ML SOPN Inject 2 mg into the skin once a week.   tiZANidine  (ZANAFLEX ) 4 MG tablet TAKE 1 TABLET(4 MG) BY MOUTH DAILY AS NEEDED FOR MUSCLE SPASMS   Triamcinolone  Acetonide (NASACORT  AQ NA) Place 1-2 sprays into both nostrils daily.   TRUEplus Lancets 33G MISC  USE AS DIRECTED   polyethylene glycol (MIRALAX ) 17 g packet Take 17 g by mouth daily. (Patient not taking: Reported on 03/04/2024)   No facility-administered encounter medications on file as of 03/04/2024.    Allergies (verified) Codeine and Meperidine    History: Past Medical History:  Diagnosis Date   Allergy 1995   Arthritis 1990   Diabetes mellitus without complication (HCC)    Encounter for annual health examination 04/17/2023   GERD (gastroesophageal reflux disease)    High cholesterol    Hypertension    Sleep apnea    mild- no CPAP   Stroke (HCC) 07/17/2003   mini- stroke due to HTN    Past Surgical History:  Procedure Laterality Date   ABDOMINAL HYSTERECTOMY     CHEST TUBE INSERTION  07/16/2002   lung punctured during shoulder surgery   FOOT SURGERY     bilateral foot surgery   HYSTEROSCOPY WITH D & C N/A 02/02/2013   Procedure: DILATATION AND CURETTAGE /HYSTEROSCOPY;  Surgeon: Hargis Paradise, MD;  Location: [facility];  Service: Gynecology;  Laterality: N/A;   KNEE ARTHROSCOPY     SHOULDER ARTHROSCOPY     TOTAL KNEE ARTHROPLASTY Left 08/05/2023   Procedure: TOTAL KNEE ARTHROPLASTY;  Surgeon: Edna Toribio LABOR, MD;  Location: [facility];  Service: Orthopedics;  Laterality: Left;   TUBAL LIGATION     Family History  Problem Relation Age of Onset   Cancer Mother    Hypertension Mother    Cancer Maternal Grandmother    Hypertension Maternal Grandmother    Arthritis Maternal Grandmother    Cancer Sister    Social History   Socioeconomic History   Marital status: Divorced    Spouse name: Not on file   Number of children: Not on file   Years of education: Not on file   Highest education level: GED or equivalent  Occupational History   Occupation: retired  Tobacco Use   Smoking status: Never   Smokeless tobacco: Never  Vaping Use   Vaping status: Never Used  Substance and Sexual Activity   Alcohol  use: Yes    Alcohol /week: 1.0 standard drink of alcohol     Types: 1 Shots of liquor per week    Comment: occassionally   Drug use: No   Sexual activity: Not Currently  Other Topics Concern   Not on file  Social History Narrative   Not on file   Social Drivers of Health   Financial Resource Strain: Low Risk  (02/29/2024)   Overall Financial Resource Strain (CARDIA)    Difficulty of Paying Living Expenses: Not hard at all  Food Insecurity: No Food Insecurity (02/29/2024)   Hunger Vital Sign    Worried About Running Out of Food in the Last Year: Never true    Ran Out of Food in the Last Year: Never true  Transportation Needs: No Transportation Needs  (02/29/2024)   PRAPARE - Administrator, Civil Service (Medical): No    Lack of Transportation (Non-Medical): No  Physical Activity: Unknown (02/29/2024)   Exercise Vital Sign    Days of Exercise per Week: Patient declined    Minutes of Exercise per Session: Not on file  Stress: No Stress Concern Present (02/29/2024)   [employer] of Occupational Health - Occupational Stress Questionnaire    Feeling of Stress: Not at all  Social Connections: Moderately Integrated (02/29/2024)   Social Connection and Isolation Panel    Frequency of Communication with Friends and Family: More than three times  a week    Frequency of Social Gatherings with Friends and Family: More than three times a week    Attends Religious Services: More than 4 times per year    Active Member of [employer] or Organizations: Yes    Attends Engineer, structural: More than 4 times per year    Marital Status: Divorced    Tobacco Counseling Counseling given: Not Answered    Clinical Intake:  Pre-visit preparation completed: Yes  Pain : 0-10 Pain Score: 5  Pain Type: Chronic pain Pain Location: Knee Pain Orientation: Left Pain Descriptors / Indicators: Aching Pain Onset: More than a month ago Pain Frequency: Constant     Nutritional Status: BMI > 30  Obese Nutritional Risks: None Diabetes: Yes CBG done?: No Did pt. bring in CBG monitor from home?: No  Lab Results  Component Value Date   HGBA1C 5.8 (H) 12/16/2023   HGBA1C 5.8 (H) 09/10/2023   HGBA1C 5.6 06/12/2023     How often do you need to have someone help you when you read instructions, pamphlets, or other written materials from your doctor or pharmacy?: 1 - Never  Interpreter Needed?: No  Information entered by :: NAllen LPN   Activities of Daily Living     03/04/2024   11:07 AM 07/31/2023   12:51 PM  In your present state of health, do you have any difficulty performing the following activities:  Hearing? 0   Vision? 0    Difficulty concentrating or making decisions? 0   Walking or climbing stairs? 1   Comment due to knee   Dressing or bathing? 0   Doing errands, shopping? 0 0  Preparing Food and eating ? N   Using the Toilet? N   In the past six months, have you accidently leaked urine? N   Do you have problems with loss of bowel control? N   Managing your Medications? N   Managing your Finances? N   Housekeeping or managing your Housekeeping? N     Patient Care Team: Jarold Medici, MD as PCP - General (Internal Medicine) Octavia Bruckner, MD as Consulting Physician (Ophthalmology) Alverda Mardy SAUNDERS, MD as Consulting Physician (Rheumatology)  I have updated your Care Teams any recent Medical Services you may have received from other providers in the past year.     Assessment:   This is a routine wellness examination for Swetha.  Hearing/Vision screen Hearing Screening - Comments:: Denies hearing issues Vision Screening - Comments:: Regular eye exams, [facility]   Goals Addressed             This Visit's Progress    Patient Stated       03/04/2024, get left knee back in order       Depression Screen     03/04/2024   11:12 AM 12/16/2023    2:19 PM 04/16/2023    2:09 PM 02/21/2023   11:59 AM 10/03/2022    8:39 AM 04/09/2022    2:15 PM 02/15/2022    2:12 PM  PHQ 2/9 Scores  PHQ - 2 Score 0 0 0 0 0 0 0  PHQ- 9 Score 0 0 0 0       Fall Risk     03/04/2024   11:11 AM 12/16/2023    2:19 PM 09/10/2023    3:39 PM 04/16/2023    2:09 PM 02/17/2023   11:15 AM  Fall Risk   Falls in the past year? 0 0 0 0 0  Number falls in past yr: 0 0 0 0 0  Injury with Fall? 0 0 0 0 0  Risk for fall due to : Medication side effect;Impaired mobility;Impaired balance/gait No Fall Risks No Fall Risks No Fall Risks Medication side effect  Follow up Falls evaluation completed;Falls prevention discussed Falls evaluation completed Falls evaluation completed Falls evaluation completed Falls prevention  discussed;Falls evaluation completed    MEDICARE RISK AT HOME:  Medicare Risk at Home Any stairs in or around the home?: Yes If so, are there any without handrails?: No Home free of loose throw rugs in walkways, pet beds, electrical cords, etc?: Yes Adequate lighting in your home to reduce risk of falls?: Yes Life alert?: No Use of a cane, walker or w/c?: Yes Grab bars in the bathroom?: Yes Shower chair or bench in shower?: Yes Elevated toilet seat or a handicapped toilet?: Yes  TIMED UP AND GO:  Was the test performed?  Yes  Length of time to ambulate 10 feet: 5 sec Gait slow and steady with assistive device  Cognitive Function: 6CIT completed        03/04/2024   11:12 AM 02/21/2023   11:59 AM 02/15/2022    2:13 PM 01/19/2021    2:34 PM 12/30/2019    2:57 PM  6CIT Screen  What Year? 0 points 0 points 0 points 0 points 0 points  What month? 0 points 0 points 0 points 0 points 0 points  What time? 0 points 0 points 0 points 0 points 0 points  Count back from 20 0 points 0 points 0 points 0 points 0 points  Months in reverse 0 points 0 points 0 points 0 points 0 points  Repeat phrase 0 points 0 points 0 points 0 points 0 points  Total Score 0 points 0 points 0 points 0 points 0 points    Immunizations Immunization History  Administered Date(s) Administered   Fluad Quad(high Dose 65+) 05/05/2020, 03/30/2021, 04/09/2022   Fluad Trivalent(High Dose 65+) 04/16/2023   Influenza, High Dose Seasonal PF 04/23/2018, 06/29/2019, 08/12/2019   PFIZER(Purple Top)SARS-COV-2 Vaccination 09/25/2019, 10/16/2019   Pneumococcal Conjugate-13 09/04/2019   Pneumococcal Polysaccharide-23 04/23/2018   Tdap 12/26/2017    Screening Tests Health Maintenance  Topic Date Due   INFLUENZA VACCINE  02/14/2024   COVID-19 Vaccine (3 - Pfizer risk series) 03/20/2024 (Originally 11/13/2019)   Zoster Vaccines- Shingrix (1 of 2) 06/04/2024 (Originally 11/28/1969)   FOOT EXAM  04/15/2024   Diabetic kidney  evaluation - Urine ACR  04/17/2024   HEMOGLOBIN A1C  06/16/2024   OPHTHALMOLOGY EXAM  07/31/2024   MAMMOGRAM  11/03/2024   Diabetic kidney evaluation - eGFR measurement  12/15/2024   Medicare Annual Wellness (AWV)  03/04/2025   DTaP/Tdap/Td (2 - Td or Tdap) 12/27/2027   Colonoscopy  12/11/2032   Pneumococcal Vaccine: 50+ Years  Completed   DEXA SCAN  Completed   Hepatitis C Screening  Completed   HPV VACCINES  Aged Out   Meningococcal B Vaccine  Aged Out    Health Maintenance  Health Maintenance Due  Topic Date Due   INFLUENZA VACCINE  02/14/2024   Health Maintenance Items Addressed: Due for flu vaccine.  Additional Screening:  Vision Screening: Recommended annual ophthalmology exams for early detection of glaucoma and other disorders of the eye. Would you like a referral to an eye doctor? No    Dental Screening: Recommended annual dental exams for proper oral hygiene  Community Resource Referral / Chronic Care Management: CRR required this  visit?  No   CCM required this visit?  No   Plan:    I have personally reviewed and noted the following in the patient's chart:   Medical and social history Use of alcohol , tobacco or illicit drugs  Current medications and supplements including opioid prescriptions. Patient is not currently taking opioid prescriptions. Functional ability and status Nutritional status Physical activity Advanced directives List of other physicians Hospitalizations, surgeries, and ER visits in previous 12 months Vitals Screenings to include cognitive, depression, and falls Referrals and appointments  In addition, I have reviewed and discussed with patient certain preventive protocols, quality metrics, and best practice recommendations. A written personalized care plan for preventive services as well as general preventive health recommendations were provided to patient.   Ardella FORBES Dawn, LPN   [id number]   After Visit Summary: (In  Person-Printed) AVS printed and given to the patient  Notes: Nothing significant to report at this time.

## 2024-03-04 NOTE — Patient Instructions (Signed)
 [name] , Thank you for taking time out of your busy schedule to complete your Annual Wellness Visit with me. I enjoyed our conversation and look forward to speaking with you again next year. I, as well as your care team,  appreciate your ongoing commitment to your health goals. Please review the following plan we discussed and let me know if I can assist you in the future. Your Game plan/ To Do List    Referrals: If you haven't heard from the office you've been referred to, please reach out to them at the phone provided.   Follow up Visits: We will see or speak with you next year for your Next Medicare AWV with our clinical staff Have you seen your provider in the last 6 months (3 months if uncontrolled diabetes)? Yes  Clinician Recommendations:  Aim for 30 minutes of exercise or brisk walking, 6-8 glasses of water , and 5 servings of fruits and vegetables each day.       This is a list of the screenings recommended for you:  Health Maintenance  Topic Date Due   Flu Shot  02/14/2024   COVID-19 Vaccine (3 - Pfizer risk series) 03/20/2024*   Zoster (Shingles) Vaccine (1 of 2) 06/04/2024*   Complete foot exam   04/15/2024   Yearly kidney health urinalysis for diabetes  04/17/2024   Hemoglobin A1C  06/16/2024   Eye exam for diabetics  07/31/2024   Mammogram  11/03/2024   Yearly kidney function blood test for diabetes  12/15/2024   Medicare Annual Wellness Visit  03/04/2025   DTaP/Tdap/Td vaccine (2 - Td or Tdap) 12/27/2027   Colon Cancer Screening  12/11/2032   Pneumococcal Vaccine for age over 62  Completed   DEXA scan (bone density measurement)  Completed   Hepatitis C Screening  Completed   HPV Vaccine  Aged Out   Meningitis B Vaccine  Aged Out  *Topic was postponed. The date shown is not the original due date.    Advanced directives: (Provided) Advance directive discussed with you today. I have provided a copy for you to complete at home and have notarized. Once this is complete,  please bring a copy in to our office so we can scan it into your chart.  Advance Care Planning is important because it:  [x]  Makes sure you receive the medical care that is consistent with your values, goals, and preferences  [x]  It provides guidance to your family and loved ones and reduces their decisional burden about whether or not they are making the right decisions based on your wishes.  Follow the link provided in your after visit summary or read over the paperwork we have mailed to you to help you started getting your Advance Directives in place. If you need assistance in completing these, please reach out to us  so that we can help you!  See attachments for Preventive Care and Fall Prevention Tips.

## 2024-03-05 ENCOUNTER — Telehealth: Payer: Self-pay | Admitting: Internal Medicine

## 2024-03-05 NOTE — Telephone Encounter (Signed)
 Called to speak to patient regarding upcoming revision of left knee prosthesis.  Unfortunately, she has not fared well since her left TKR performed on 08/06/23.  She hopes to move forward  a revision performed by  Dr. Edna from [facility].   She does not have any stairs in her home. Denies having cp/sob. She is feeling better since she has since started physical therapy for her back. She is followed by Dr. Ibazebo for lumbar spinal stenosis.   Most recent labs reviewed, A1c June 2025, 5.8. BP 128/84. Echo July 2025 w/ nl EF and calcium  score is ZERO.   She should be able to move forward with acceptable risk.  RS

## 2024-03-06 ENCOUNTER — Encounter: Payer: Self-pay | Admitting: Internal Medicine

## 2024-03-06 DIAGNOSIS — M5459 Other low back pain: Secondary | ICD-10-CM | POA: Diagnosis not present

## 2024-03-11 ENCOUNTER — Other Ambulatory Visit: Payer: Self-pay | Admitting: Internal Medicine

## 2024-03-11 NOTE — Progress Notes (Signed)
 Sent message, via epic in basket, requesting orders in epic from Careers adviser.

## 2024-03-13 DIAGNOSIS — M1712 Unilateral primary osteoarthritis, left knee: Secondary | ICD-10-CM | POA: Diagnosis not present

## 2024-03-17 ENCOUNTER — Ambulatory Visit: Payer: Self-pay | Admitting: Emergency Medicine

## 2024-03-17 DIAGNOSIS — G8929 Other chronic pain: Secondary | ICD-10-CM

## 2024-03-17 NOTE — H&P (View-Only) (Signed)
 TOTAL KNEE REVISION ADMISSION H&P  Patient is being admitted for left revision total knee arthroplasty.  Subjective:  Chief Complaint:left knee pain.  HPI: [name], 73 y.o. female, has a history of pain and functional disability in the left knee(s) due to post-operative instability and patient has failed non-surgical conservative treatments for greater than 12 weeks to include NSAID's and/or analgesics, flexibility and strengthening excercises, supervised PT with diminished ADL's post treatment, use of assistive devices, and activity modification. The indications for the revision of the total knee arthroplasty are increased instability of post-operative state. Onset of symptoms was gradual starting less than 1 years ago with gradually worsening course since that time.  Prior procedures on the left knee(s) include arthroplasty.  Patient currently rates pain in the left knee(s) at 7 out of 10 with activity. There is night pain, pain that interferes with activities of daily living, and feelings of instability.   This condition presents safety issues increasing the risk of falls.  There is no current active infection.  Patient Active Problem List   Diagnosis Date Noted   Instability of left knee joint 12/16/2023   Hypertensive heart disease without heart failure 09/17/2023   Lumbar radiculopathy 09/17/2023   Preoperative clearance 06/16/2023   Seasonal allergic rhinitis 05/20/2023   Right ear pain 05/20/2023   Postnasal drip 05/20/2023   Otalgia of right ear 05/20/2023   Encounter for annual health examination 04/17/2023   Aortic atherosclerosis (HCC) 04/17/2023   Estrogen deficiency 04/17/2023   Dyslipidemia associated with type 2 diabetes mellitus (HCC) 12/07/2021   Pure hypercholesterolemia 12/07/2021   Arthritis of hand 07/01/2021   Pain in right hand 06/29/2021   Pain of left hand 06/29/2021   Carpal tunnel syndrome 06/29/2021   Pain in thoracic spine 10/07/2020   OSA on CPAP  07/30/2018   Left hip pain 07/02/2018   Snoring 02/27/2018   Intermittent sleepiness 02/27/2018   Sleep pattern disturbance 02/27/2018   Obesity (BMI 35.0-39.9 without comorbidity) 02/27/2018   Essential hypertension, benign 02/27/2018   Hyperlipidemia 02/20/2013   Hypertension 02/20/2013   Past Medical History:  Diagnosis Date   Allergy 1995   Arthritis 1990   Diabetes mellitus without complication (HCC)    Encounter for annual health examination 04/17/2023   GERD (gastroesophageal reflux disease)    High cholesterol    Hypertension    Sleep apnea    mild- no CPAP   Stroke (HCC) 07/17/2003   mini- stroke due to HTN    Past Surgical History:  Procedure Laterality Date   ABDOMINAL HYSTERECTOMY     CHEST TUBE INSERTION  07/16/2002   lung punctured during shoulder surgery   FOOT SURGERY     bilateral foot surgery   HYSTEROSCOPY WITH D & C N/A 02/02/2013   Procedure: DILATATION AND CURETTAGE /HYSTEROSCOPY;  Surgeon: Hargis Paradise, MD;  Location: [facility];  Service: Gynecology;  Laterality: N/A;   KNEE ARTHROSCOPY     SHOULDER ARTHROSCOPY     TOTAL KNEE ARTHROPLASTY Left 08/05/2023   Procedure: TOTAL KNEE ARTHROPLASTY;  Surgeon: Edna Toribio LABOR, MD;  Location: [facility];  Service: Orthopedics;  Laterality: Left;   TUBAL LIGATION      Current Outpatient Medications  Medication Sig Dispense Refill Last Dose/Taking   acetaminophen  (TYLENOL ) 500 MG tablet Take 1,000 mg by mouth daily as needed (pain.).      Alcohol  Swabs (DROPSAFE ALCOHOL  PREP) 70 % PADS USE AS DIRECTED WHEN CHECKING BLOOD SUGARS 300 each 3    amoxicillin  (  AMOXIL ) 500 MG tablet Take 2,000 mg by mouth See admin instructions. Take 4 tablets (2000 mg) by mouth 1 hour prior to dental appointments.      atorvastatin  (LIPITOR) 10 MG tablet Take one tab po daily M-Friday and skip Saturday and Sundays (Patient taking differently: Take 10 mg by mouth every other day. In the evening.) 90 tablet 1    Blood Glucose  Calibration (TRUE METRIX LEVEL 1) Low SOLN USE AS DIRECTED WITH GLUCOSE METER 1 each 3    Blood Glucose Monitoring Suppl (TRUE METRIX AIR GLUCOSE METER) w/Device KIT Inject 1 kit into the skin daily. Use as directed to check blood sugars 1 time per dx: e11.22 1 kit 1    Cholecalciferol (VITAMIN D) 2000 UNITS tablet Take 2,000 Units by mouth every evening.      gabapentin  (NEURONTIN ) 300 MG capsule Take 300 mg by mouth at bedtime.      glucose blood (TRUE METRIX BLOOD GLUCOSE TEST) test strip USE AS INSTRUCTED 300 strip 3    latanoprost (XALATAN) 0.005 % ophthalmic solution Place 1 drop into both eyes at bedtime.      levocetirizine (XYZAL) 5 MG tablet Take 5 mg by mouth daily as needed for allergies.      metFORMIN  (GLUCOPHAGE -XR) 750 MG 24 hr tablet TAKE 1 TABLET EVERY DAY WITH EVENING MEAL (Patient taking differently: Take 750 mg by mouth daily in the afternoon. TAKE 1 TABLET EVERY DAY WITH EVENING MEAL) 90 tablet 3    montelukast  (SINGULAIR ) 10 MG tablet TAKE 1 TABLET(10 MG) BY MOUTH DAILY (Patient taking differently: Take 10 mg by mouth daily as needed (allergies.).) 30 tablet 2    Multiple Vitamin (MULTIVITAMIN WITH MINERALS) TABS tablet Take 1 tablet by mouth daily in the afternoon.      naproxen sodium (ALEVE) 220 MG tablet Take 440 mg by mouth daily as needed (pain.).      Olmesartan -amLODIPine -HCTZ 40-5-12.5 MG TABS TAKE 1 TABLET EVERY DAY (Patient taking differently: Take 1 tablet by mouth daily in the afternoon.) 90 tablet 3    omeprazole  (PRILOSEC) 40 MG capsule Take 1 capsule (40 mg total) by mouth daily for 21 days. 21 capsule 0    Semaglutide , 2 MG/DOSE, (OZEMPIC , 2 MG/DOSE,) 8 MG/3ML SOPN Inject 2 mg into the skin once a week. (Patient taking differently: Inject 2 mg into the skin every Sunday.) 3 mL 3    Triamcinolone  Acetonide (NASACORT  AQ NA) Place 1-2 sprays into both nostrils daily as needed (nasal congestion/allergies.).      TRUEplus Lancets 33G MISC USE AS DIRECTED 300 each 3     No current facility-administered medications for this visit.   Allergies  Allergen Reactions   Codeine Nausea And Vomiting and Diarrhea   Meperidine      Other reaction(s): Delusions (intolerance)    Social History   Tobacco Use   Smoking status: Never   Smokeless tobacco: Never  Substance Use Topics   Alcohol  use: Yes    Alcohol /week: 1.0 standard drink of alcohol     Types: 1 Shots of liquor per week    Comment: occassionally    Family History  Problem Relation Age of Onset   Cancer Mother    Hypertension Mother    Cancer Maternal Grandmother    Hypertension Maternal Grandmother    Arthritis Maternal Grandmother    Cancer Sister       Review of Systems  Musculoskeletal:  Positive for arthralgias.  All other systems reviewed and are negative.  Objective:  Physical Exam Constitutional:      General: She is not in acute distress.    Appearance: Normal appearance. She is not ill-appearing.  HENT:     Head: Normocephalic and atraumatic.     Right Ear: External ear normal.     Left Ear: External ear normal.     Nose: Nose normal.     Mouth/Throat:     Mouth: Mucous membranes are moist.     Pharynx: Oropharynx is clear.  Eyes:     Extraocular Movements: Extraocular movements intact.     Conjunctiva/sclera: Conjunctivae normal.  Cardiovascular:     Rate and Rhythm: Normal rate and regular rhythm.     Pulses: Normal pulses.     Heart sounds: Normal heart sounds.  Pulmonary:     Effort: Pulmonary effort is normal.     Breath sounds: Normal breath sounds.  Abdominal:     General: Bowel sounds are normal.     Palpations: Abdomen is soft.     Tenderness: There is no abdominal tenderness.  Musculoskeletal:        General: Tenderness present.     Cervical back: Normal range of motion and neck supple.     Comments: TTP over medial and lateral joint line, medial worse than lateral.  No calf tenderness, swelling, or erythema.  Well healed vertical incision over  left knee, otherwise no overlying lesions of area of chief complaint.  Decreased strength and ROM due to elicited pain.   Dorsiflexion and plantarflexion intact.  Generally stable to varus and valgus stress though with noted increased 1+ laxity.  BLE appear grossly neurovascularly intact.  Gait mildly antalgic with use of walker.   Skin:    General: Skin is warm and dry.  Neurological:     Mental Status: She is alert and oriented to person, place, and time. Mental status is at baseline.  Psychiatric:        Mood and Affect: Mood normal.        Behavior: Behavior normal.     Vital signs in last 24 hours: @VSRANGES @  Labs:  Estimated body mass index is 39.48 kg/m as calculated from the following:   Height as of 03/04/24: 5' 6 (1.676 m).   Weight as of 03/04/24: 110.9 kg.  Imaging Review Plain radiographs demonstrate total knee arthroplasty components in good position without adverse features.  No overt evidence of loosening regarding the components. The overall alignment is neutral.The bone quality appears to be fair for age and reported activity level.      Assessment/Plan:  Post-operative instability, left knee(s), poly exchange.   The patient history, physical examination, clinical judgment of the provider and imaging studies are consistent with post-operative instability of the left knee(s), previous total knee arthroplasty. Revision total knee arthroplasty is deemed medically necessary, with goal of poly exchange. The treatment options including medical management, injection therapy, arthroscopy and revision arthroplasty were discussed at length.  No overt evidence of loosening though did discuss the possibility of more extensive revision dependent on surgical findings. The risks and benefits of revision total knee arthroplasty were presented and reviewed. The risks due to aseptic loosening, infection, stiffness, patella tracking problems, thromboembolic complications and other  imponderables were discussed. The patient acknowledged the explanation, agreed to proceed with the plan and consent was signed. Patient is being admitted for inpatient treatment for surgery, pain control, PT, OT, prophylactic antibiotics, VTE prophylaxis, progressive ambulation and ADL's and discharge planning.The patient is planning to be  discharged OPPT ([facility]).

## 2024-03-17 NOTE — H&P (Signed)
 TOTAL KNEE REVISION ADMISSION H&P  Patient is being admitted for left revision total knee arthroplasty.  Subjective:  Chief Complaint:left knee pain.  HPI: [name], 73 y.o. female, has a history of pain and functional disability in the left knee(s) due to post-operative instability and patient has failed non-surgical conservative treatments for greater than 12 weeks to include NSAID's and/or analgesics, flexibility and strengthening excercises, supervised PT with diminished ADL's post treatment, use of assistive devices, and activity modification. The indications for the revision of the total knee arthroplasty are increased instability of post-operative state. Onset of symptoms was gradual starting less than 1 years ago with gradually worsening course since that time.  Prior procedures on the left knee(s) include arthroplasty.  Patient currently rates pain in the left knee(s) at 7 out of 10 with activity. There is night pain, pain that interferes with activities of daily living, and feelings of instability.   This condition presents safety issues increasing the risk of falls.  There is no current active infection.  Patient Active Problem List   Diagnosis Date Noted   Instability of left knee joint 12/16/2023   Hypertensive heart disease without heart failure 09/17/2023   Lumbar radiculopathy 09/17/2023   Preoperative clearance 06/16/2023   Seasonal allergic rhinitis 05/20/2023   Right ear pain 05/20/2023   Postnasal drip 05/20/2023   Otalgia of right ear 05/20/2023   Encounter for annual health examination 04/17/2023   Aortic atherosclerosis (HCC) 04/17/2023   Estrogen deficiency 04/17/2023   Dyslipidemia associated with type 2 diabetes mellitus (HCC) 12/07/2021   Pure hypercholesterolemia 12/07/2021   Arthritis of hand 07/01/2021   Pain in right hand 06/29/2021   Pain of left hand 06/29/2021   Carpal tunnel syndrome 06/29/2021   Pain in thoracic spine 10/07/2020   OSA on CPAP  07/30/2018   Left hip pain 07/02/2018   Snoring 02/27/2018   Intermittent sleepiness 02/27/2018   Sleep pattern disturbance 02/27/2018   Obesity (BMI 35.0-39.9 without comorbidity) 02/27/2018   Essential hypertension, benign 02/27/2018   Hyperlipidemia 02/20/2013   Hypertension 02/20/2013   Past Medical History:  Diagnosis Date   Allergy 1995   Arthritis 1990   Diabetes mellitus without complication (HCC)    Encounter for annual health examination 04/17/2023   GERD (gastroesophageal reflux disease)    High cholesterol    Hypertension    Sleep apnea    mild- no CPAP   Stroke (HCC) 07/17/2003   mini- stroke due to HTN    Past Surgical History:  Procedure Laterality Date   ABDOMINAL HYSTERECTOMY     CHEST TUBE INSERTION  07/16/2002   lung punctured during shoulder surgery   FOOT SURGERY     bilateral foot surgery   HYSTEROSCOPY WITH D & C N/A 02/02/2013   Procedure: DILATATION AND CURETTAGE /HYSTEROSCOPY;  Surgeon: Hargis Paradise, MD;  Location: [facility];  Service: Gynecology;  Laterality: N/A;   KNEE ARTHROSCOPY     SHOULDER ARTHROSCOPY     TOTAL KNEE ARTHROPLASTY Left 08/05/2023   Procedure: TOTAL KNEE ARTHROPLASTY;  Surgeon: Edna Toribio LABOR, MD;  Location: [facility];  Service: Orthopedics;  Laterality: Left;   TUBAL LIGATION      Current Outpatient Medications  Medication Sig Dispense Refill Last Dose/Taking   acetaminophen  (TYLENOL ) 500 MG tablet Take 1,000 mg by mouth daily as needed (pain.).      Alcohol  Swabs (DROPSAFE ALCOHOL  PREP) 70 % PADS USE AS DIRECTED WHEN CHECKING BLOOD SUGARS 300 each 3    amoxicillin  (  AMOXIL ) 500 MG tablet Take 2,000 mg by mouth See admin instructions. Take 4 tablets (2000 mg) by mouth 1 hour prior to dental appointments.      atorvastatin  (LIPITOR) 10 MG tablet Take one tab po daily M-Friday and skip Saturday and Sundays (Patient taking differently: Take 10 mg by mouth every other day. In the evening.) 90 tablet 1    Blood Glucose  Calibration (TRUE METRIX LEVEL 1) Low SOLN USE AS DIRECTED WITH GLUCOSE METER 1 each 3    Blood Glucose Monitoring Suppl (TRUE METRIX AIR GLUCOSE METER) w/Device KIT Inject 1 kit into the skin daily. Use as directed to check blood sugars 1 time per dx: e11.22 1 kit 1    Cholecalciferol (VITAMIN D) 2000 UNITS tablet Take 2,000 Units by mouth every evening.      gabapentin  (NEURONTIN ) 300 MG capsule Take 300 mg by mouth at bedtime.      glucose blood (TRUE METRIX BLOOD GLUCOSE TEST) test strip USE AS INSTRUCTED 300 strip 3    latanoprost (XALATAN) 0.005 % ophthalmic solution Place 1 drop into both eyes at bedtime.      levocetirizine (XYZAL) 5 MG tablet Take 5 mg by mouth daily as needed for allergies.      metFORMIN  (GLUCOPHAGE -XR) 750 MG 24 hr tablet TAKE 1 TABLET EVERY DAY WITH EVENING MEAL (Patient taking differently: Take 750 mg by mouth daily in the afternoon. TAKE 1 TABLET EVERY DAY WITH EVENING MEAL) 90 tablet 3    montelukast  (SINGULAIR ) 10 MG tablet TAKE 1 TABLET(10 MG) BY MOUTH DAILY (Patient taking differently: Take 10 mg by mouth daily as needed (allergies.).) 30 tablet 2    Multiple Vitamin (MULTIVITAMIN WITH MINERALS) TABS tablet Take 1 tablet by mouth daily in the afternoon.      naproxen sodium (ALEVE) 220 MG tablet Take 440 mg by mouth daily as needed (pain.).      Olmesartan -amLODIPine -HCTZ 40-5-12.5 MG TABS TAKE 1 TABLET EVERY DAY (Patient taking differently: Take 1 tablet by mouth daily in the afternoon.) 90 tablet 3    omeprazole  (PRILOSEC) 40 MG capsule Take 1 capsule (40 mg total) by mouth daily for 21 days. 21 capsule 0    Semaglutide , 2 MG/DOSE, (OZEMPIC , 2 MG/DOSE,) 8 MG/3ML SOPN Inject 2 mg into the skin once a week. (Patient taking differently: Inject 2 mg into the skin every Sunday.) 3 mL 3    Triamcinolone  Acetonide (NASACORT  AQ NA) Place 1-2 sprays into both nostrils daily as needed (nasal congestion/allergies.).      TRUEplus Lancets 33G MISC USE AS DIRECTED 300 each 3     No current facility-administered medications for this visit.   Allergies  Allergen Reactions   Codeine Nausea And Vomiting and Diarrhea   Meperidine      Other reaction(s): Delusions (intolerance)    Social History   Tobacco Use   Smoking status: Never   Smokeless tobacco: Never  Substance Use Topics   Alcohol  use: Yes    Alcohol /week: 1.0 standard drink of alcohol     Types: 1 Shots of liquor per week    Comment: occassionally    Family History  Problem Relation Age of Onset   Cancer Mother    Hypertension Mother    Cancer Maternal Grandmother    Hypertension Maternal Grandmother    Arthritis Maternal Grandmother    Cancer Sister       Review of Systems  Musculoskeletal:  Positive for arthralgias.  All other systems reviewed and are negative.  Objective:  Physical Exam Constitutional:      General: She is not in acute distress.    Appearance: Normal appearance. She is not ill-appearing.  HENT:     Head: Normocephalic and atraumatic.     Right Ear: External ear normal.     Left Ear: External ear normal.     Nose: Nose normal.     Mouth/Throat:     Mouth: Mucous membranes are moist.     Pharynx: Oropharynx is clear.  Eyes:     Extraocular Movements: Extraocular movements intact.     Conjunctiva/sclera: Conjunctivae normal.  Cardiovascular:     Rate and Rhythm: Normal rate and regular rhythm.     Pulses: Normal pulses.     Heart sounds: Normal heart sounds.  Pulmonary:     Effort: Pulmonary effort is normal.     Breath sounds: Normal breath sounds.  Abdominal:     General: Bowel sounds are normal.     Palpations: Abdomen is soft.     Tenderness: There is no abdominal tenderness.  Musculoskeletal:        General: Tenderness present.     Cervical back: Normal range of motion and neck supple.     Comments: TTP over medial and lateral joint line, medial worse than lateral.  No calf tenderness, swelling, or erythema.  Well healed vertical incision over  left knee, otherwise no overlying lesions of area of chief complaint.  Decreased strength and ROM due to elicited pain.   Dorsiflexion and plantarflexion intact.  Generally stable to varus and valgus stress though with noted increased 1+ laxity.  BLE appear grossly neurovascularly intact.  Gait mildly antalgic with use of walker.   Skin:    General: Skin is warm and dry.  Neurological:     Mental Status: She is alert and oriented to person, place, and time. Mental status is at baseline.  Psychiatric:        Mood and Affect: Mood normal.        Behavior: Behavior normal.     Vital signs in last 24 hours: @VSRANGES @  Labs:  Estimated body mass index is 39.48 kg/m as calculated from the following:   Height as of 03/04/24: 5' 6 (1.676 m).   Weight as of 03/04/24: 110.9 kg.  Imaging Review Plain radiographs demonstrate total knee arthroplasty components in good position without adverse features.  No overt evidence of loosening regarding the components. The overall alignment is neutral.The bone quality appears to be fair for age and reported activity level.      Assessment/Plan:  Post-operative instability, left knee(s), poly exchange.   The patient history, physical examination, clinical judgment of the provider and imaging studies are consistent with post-operative instability of the left knee(s), previous total knee arthroplasty. Revision total knee arthroplasty is deemed medically necessary, with goal of poly exchange. The treatment options including medical management, injection therapy, arthroscopy and revision arthroplasty were discussed at length.  No overt evidence of loosening though did discuss the possibility of more extensive revision dependent on surgical findings. The risks and benefits of revision total knee arthroplasty were presented and reviewed. The risks due to aseptic loosening, infection, stiffness, patella tracking problems, thromboembolic complications and other  imponderables were discussed. The patient acknowledged the explanation, agreed to proceed with the plan and consent was signed. Patient is being admitted for inpatient treatment for surgery, pain control, PT, OT, prophylactic antibiotics, VTE prophylaxis, progressive ambulation and ADL's and discharge planning.The patient is planning to be  discharged OPPT ([facility]).

## 2024-03-17 NOTE — Progress Notes (Signed)
Second request for pre op orders spoke with: Kelly 

## 2024-03-18 NOTE — Progress Notes (Signed)
 Anesthesia Review:  PCP: [name]- LOV 12/16/23 clearance in media tab dated 03/06/2024 clearance has no date   Cardiologist : none   PPM/ ICD: Device Orders: Rep Notified:  Chest x-ray : EKG : 06/12/23  Echo : 01/29/24  Ct Card- 2023  Stress test: Cardiac Cath :   Activity level: can do a flight of stairs without difficutly  Sleep Study/ CPAP : mild sleep apnea no cpap  Fasting Blood Sugar :      / Checks Blood Sugar -- times a day:     DM- type 2 Hgba1c-  03/19/24- 5.7  Metformin - none am of surgery  Ozempic - last dose on  03/22/2024   Blood Thinner/ Instructions /Last Dose: ASA / Instructions/ Last Dose :

## 2024-03-18 NOTE — Patient Instructions (Signed)
 SURGICAL WAITING ROOM VISITATION  Patients having surgery or a procedure may have no more than 2 support people in the waiting area - these visitors may rotate.    Children under the age of 39 must have an adult with them who is not the patient.  Visitors with respiratory illnesses are discouraged from visiting and should remain at home.  If the patient needs to stay at the hospital during part of their recovery, the visitor guidelines for inpatient rooms apply. Pre-op nurse will coordinate an appropriate time for 1 support person to accompany patient in pre-op.  This support person may not rotate.    Please refer to the [facility] website for the visitor guidelines for Inpatients (after your surgery is over and you are in a regular room).       Your procedure is scheduled on: 03/30/2024     Report to [facility] Main Entrance    Report to admitting at  0730 AM   Call this number if you have problems the morning of surgery [phone]   Do not eat food :After Midnight.   After Midnight you may have the following liquids until _ 0700_____ AM DAY OF SURGERY  Water  Non-Citrus Juices (without pulp, NO RED-Apple, White grape, White cranberry) Black Coffee (NO MILK/CREAM OR CREAMERS, sugar ok)  Clear Tea (NO MILK/CREAM OR CREAMERS, sugar ok) regular and decaf                             Plain Jell-O (NO RED)                                           Fruit ices (not with fruit pulp, NO RED)                                     Popsicles (NO RED)                                                               Sports drinks like Gatorade (NO RED)                   The day of surgery:  Drink ONE (1) Pre-Surgery Clear Ensure or G2 at  0700 AM the morning of surgery. Drink in one sitting. Do not sip.  This drink was given to you during your hospital  pre-op appointment visit. Nothing else to drink after completing the  Pre-Surgery Clear Ensure or G2.          If you have  questions, please contact your surgeon's office.      Oral Hygiene is also important to reduce your risk of infection.                                    Remember - BRUSH YOUR TEETH THE MORNING OF SURGERY WITH YOUR REGULAR TOOTHPASTE  DENTURES WILL BE REMOVED PRIOR TO SURGERY PLEASE DO NOT APPLY Poly grip OR ADHESIVES!!!  Do NOT smoke after Midnight   Stop all vitamins and herbal supplements 7 days before surgery.   Take these medicines the morning of surgery with A SIP OF WATER :  singulair  if needed              Metformin - none am of surgery              Ozrmpic- last dose on   DO NOT TAKE ANY ORAL DIABETIC MEDICATIONS DAY OF YOUR SURGERY  Bring CPAP mask and tubing day of surgery.                              You may not have any metal on your body including hair pins, jewelry, and body piercing             Do not wear make-up, lotions, powders, perfumes/cologne, or deodorant  Do not wear nail polish including gel and S&S, artificial/acrylic nails, or any other type of covering on natural nails including finger and toenails. If you have artificial nails, gel coating, etc. that needs to be removed by a nail salon please have this removed prior to surgery or surgery may need to be canceled/ delayed if the surgeon/ anesthesia feels like they are unable to be safely monitored.   Do not shave  48 hours prior to surgery.               Men may shave face and neck.   Do not bring valuables to the hospital. [facility] IS NOT             RESPONSIBLE   FOR VALUABLES.   Contacts, glasses, dentures or bridgework may not be worn into surgery.   Bring small overnight bag day of surgery.   DO NOT BRING YOUR HOME MEDICATIONS TO THE HOSPITAL. PHARMACY WILL DISPENSE MEDICATIONS LISTED ON YOUR MEDICATION LIST TO YOU DURING YOUR ADMISSION IN THE HOSPITAL!    Patients discharged on the day of surgery will not be allowed to drive home.  Someone NEEDS to stay with you for the first 24 hours  after anesthesia.   Special Instructions: Bring a copy of your healthcare power of attorney and living will documents the day of surgery if you haven't scanned them before.              Please read over the following fact sheets you were given: IF YOU HAVE QUESTIONS ABOUT YOUR PRE-OP INSTRUCTIONS PLEASE CALL [phone].   If you received a COVID test during your pre-op visit  it is requested that you wear a mask when out in public, stay away from anyone that may not be feeling well and notify your surgeon if you develop symptoms. If you test positive for Covid or have been in contact with anyone that has tested positive in the last 10 days please notify you surgeon.      Pre-operative 5 CHG Bath Instructions   You can play a key role in reducing the risk of infection after surgery. Your skin needs to be as free of germs as possible. You can reduce the number of germs on your skin by washing with CHG (chlorhexidine  gluconate) soap before surgery. CHG is an antiseptic soap that kills germs and continues to kill germs even after washing.   DO NOT use if you have an allergy to chlorhexidine /CHG or antibacterial soaps. If your skin becomes reddened or irritated, stop using the CHG and  notify one of our RNs at [phone].   Please shower with the CHG soap starting 4 days before surgery using the following schedule:     Please keep in mind the following:  DO NOT shave, including legs and underarms, starting the day of your first shower.   You may shave your face at any point before/day of surgery.  Place clean sheets on your bed the day you start using CHG soap. Use a clean washcloth (not used since being washed) for each shower. DO NOT sleep with pets once you start using the CHG.   CHG Shower Instructions:  If you choose to wash your hair and private area, wash first with your normal shampoo/soap.  After you use shampoo/soap, rinse your hair and body thoroughly to remove shampoo/soap  residue.  Turn the water  OFF and apply about 3 tablespoons (45 ml) of CHG soap to a CLEAN washcloth.  Apply CHG soap ONLY FROM YOUR NECK DOWN TO YOUR TOES (washing for 3-5 minutes)  DO NOT use CHG soap on face, private areas, open wounds, or sores.  Pay special attention to the area where your surgery is being performed.  If you are having back surgery, having someone wash your back for you may be helpful. Wait 2 minutes after CHG soap is applied, then you may rinse off the CHG soap.  Pat dry with a clean towel  Put on clean clothes/pajamas   If you choose to wear lotion, please use ONLY the CHG-compatible lotions on the back of this paper.     Additional instructions for the day of surgery: DO NOT APPLY any lotions, deodorants, cologne, or perfumes.   Put on clean/comfortable clothes.  Brush your teeth.  Ask your nurse before applying any prescription medications to the skin.      CHG Compatible Lotions   Aveeno Moisturizing lotion  Cetaphil Moisturizing Cream  Cetaphil Moisturizing Lotion  Clairol Herbal Essence Moisturizing Lotion, Dry Skin  Clairol Herbal Essence Moisturizing Lotion, Extra Dry Skin  Clairol Herbal Essence Moisturizing Lotion, Normal Skin  Curel Age Defying Therapeutic Moisturizing Lotion with Alpha Hydroxy  Curel Extreme Care Body Lotion  Curel Soothing Hands Moisturizing Hand Lotion  Curel Therapeutic Moisturizing Cream, Fragrance-Free  Curel Therapeutic Moisturizing Lotion, Fragrance-Free  Curel Therapeutic Moisturizing Lotion, Original Formula  Eucerin Daily Replenishing Lotion  Eucerin Dry Skin Therapy Plus Alpha Hydroxy Crme  Eucerin Dry Skin Therapy Plus Alpha Hydroxy Lotion  Eucerin Original Crme  Eucerin Original Lotion  Eucerin Plus Crme Eucerin Plus Lotion  Eucerin TriLipid Replenishing Lotion  Keri Anti-Bacterial Hand Lotion  Keri Deep Conditioning Original Lotion Dry Skin Formula Softly Scented  Keri Deep Conditioning Original Lotion,  Fragrance Free Sensitive Skin Formula  Keri Lotion Fast Absorbing Fragrance Free Sensitive Skin Formula  Keri Lotion Fast Absorbing Softly Scented Dry Skin Formula  Keri Original Lotion  Keri Skin Renewal Lotion Keri Silky Smooth Lotion  Keri Silky Smooth Sensitive Skin Lotion  Nivea Body Creamy Conditioning Oil  Nivea Body Extra Enriched Teacher, adult education Moisturizing Lotion Nivea Crme  Nivea Skin Firming Lotion  NutraDerm 30 Skin Lotion  NutraDerm Skin Lotion  NutraDerm Therapeutic Skin Cream  NutraDerm Therapeutic Skin Lotion  ProShield Protective Hand Cream  Provon moisturizing lotion

## 2024-03-19 ENCOUNTER — Encounter (HOSPITAL_COMMUNITY)
Admission: RE | Admit: 2024-03-19 | Discharge: 2024-03-19 | Disposition: A | Discharge status: Home / Self Care | Source: Ambulatory Visit | Attending: Orthopedic Surgery | Admitting: Orthopedic Surgery

## 2024-03-19 ENCOUNTER — Encounter (HOSPITAL_COMMUNITY): Payer: Self-pay

## 2024-03-19 ENCOUNTER — Other Ambulatory Visit: Payer: Self-pay

## 2024-03-19 VITALS — BP 168/90 | HR 71 | Temp 98.1°F | Resp 16 | Ht 66.0 in | Wt 243.0 lb

## 2024-03-19 DIAGNOSIS — Z01818 Encounter for other preprocedural examination: Secondary | ICD-10-CM | POA: Insufficient documentation

## 2024-03-19 DIAGNOSIS — G8929 Other chronic pain: Secondary | ICD-10-CM | POA: Diagnosis not present

## 2024-03-19 DIAGNOSIS — Z96652 Presence of left artificial knee joint: Secondary | ICD-10-CM | POA: Insufficient documentation

## 2024-03-19 HISTORY — DX: Headache, unspecified: R51.9

## 2024-03-19 LAB — TYPE AND SCREEN
ABO/RH(D): B POS
Antibody Screen: NEGATIVE

## 2024-03-19 LAB — COMPREHENSIVE METABOLIC PANEL WITH GFR
ALT: 11 U/L (ref 0–44)
AST: 19 U/L (ref 15–41)
Albumin: 4.1 g/dL (ref 3.5–5.0)
Alkaline Phosphatase: 90 U/L (ref 38–126)
Anion gap: 13 (ref 5–15)
BUN: 15 mg/dL (ref 8–23)
CO2: 22 mmol/L (ref 22–32)
Calcium: 9.6 mg/dL (ref 8.9–10.3)
Chloride: 105 mmol/L (ref 98–111)
Creatinine, Ser: 0.66 mg/dL (ref 0.44–1.00)
GFR, Estimated: >60 mL/min (ref >60)
Glucose, Bld: 93 mg/dL (ref 70–99)
Potassium: 3.4 mmol/L — ABNORMAL LOW (ref 3.5–5.1)
Sodium: 141 mmol/L (ref 135–145)
Total Bilirubin: 0.4 mg/dL (ref 0.0–1.2)
Total Protein: 6.9 g/dL (ref 6.5–8.1)

## 2024-03-19 LAB — CBC WITH DIFFERENTIAL/PLATELET
Abs Immature Granulocytes: 0.02 K/uL (ref 0.00–0.07)
Basophils Absolute: 0 K/uL (ref 0.0–0.1)
Basophils Relative: 1 %
Eosinophils Absolute: 0.1 K/uL (ref 0.0–0.5)
Eosinophils Relative: 2 %
HCT: 47.2 % — ABNORMAL HIGH (ref 36.0–46.0)
Hemoglobin: 14.2 g/dL (ref 12.0–15.0)
Immature Granulocytes: 0 %
Lymphocytes Relative: 32 %
Lymphs Abs: 1.7 K/uL (ref 0.7–4.0)
MCH: 25.1 pg — ABNORMAL LOW (ref 26.0–34.0)
MCHC: 30.1 g/dL (ref 30.0–36.0)
MCV: 83.5 fL (ref 80.0–100.0)
Monocytes Absolute: 0.5 K/uL (ref 0.1–1.0)
Monocytes Relative: 10 %
Neutro Abs: 3 K/uL (ref 1.7–7.7)
Neutrophils Relative %: 55 %
Platelets: 365 K/uL (ref 150–400)
RBC: 5.65 MIL/uL — ABNORMAL HIGH (ref 3.87–5.11)
RDW: 14.7 % (ref 11.5–15.5)
WBC: 5.4 K/uL (ref 4.0–10.5)
nRBC: 0 % (ref 0.0–0.2)

## 2024-03-19 LAB — HEMOGLOBIN A1C
Hgb A1c MFr Bld: 5.7 % — ABNORMAL HIGH (ref 4.8–5.6)
Mean Plasma Glucose: 116.89 mg/dL

## 2024-03-19 LAB — SURGICAL PCR SCREEN
MRSA, PCR: NEGATIVE
Staphylococcus aureus: NEGATIVE

## 2024-03-19 LAB — GLUCOSE, CAPILLARY: Glucose-Capillary: 92 mg/dL (ref 70–99)

## 2024-03-27 NOTE — Progress Notes (Signed)
 Pt updated and verbalizes understanding of 0515 arrival Monday 09/15

## 2024-03-28 NOTE — Anesthesia Preprocedure Evaluation (Addendum)
 Anesthesia Evaluation  Patient identified by MRN, date of birth, ID band Patient awake    Reviewed: Allergy & Precautions, NPO status , Patient's Chart, lab work & pertinent test results  History of Anesthesia Complications Negative for: history of anesthetic complications  Airway Mallampati: II  TM Distance: >3 FB Neck ROM: Full    Dental no notable dental hx. (+) Teeth Intact, Dental Advisory Given   Pulmonary    Pulmonary exam normal breath sounds clear to auscultation       Cardiovascular hypertension (.), Pt. on medications (-) angina (-) Past MI Normal cardiovascular exam Rhythm:Regular Rate:Normal  01/29/2024 TTE  1. Left ventricular ejection fraction, by estimation, is 60 to 65%. The  left ventricle has normal function. The left ventricle has no regional  wall motion abnormalities. Left ventricular diastolic parameters were  normal.   2. Right ventricular systolic function is normal. The right ventricular  size is normal.   3. The mitral valve is normal in structure. No evidence of mitral valve  regurgitation. No evidence of mitral stenosis.   4. The aortic valve is tricuspid. Aortic valve regurgitation is trivial.  No aortic stenosis is present.   5. The inferior vena cava is normal in size with greater than 50%  respiratory variability, suggesting right atrial pressure of 3 mmHg.     Neuro/Psych  Neuromuscular disease CVA, No Residual Symptoms    GI/Hepatic ,GERD  Medicated,,  Endo/Other  diabetes    Renal/GU Lab Results      Component                Value               Date                           K                        3.4 (L)             03/19/2024                  Musculoskeletal  (+) Arthritis ,    Abdominal   Peds  Hematology Lab Results      Component                Value               Date                      WBC                      5.4                 03/19/2024                HGB                       14.2                03/19/2024                HCT                      47.2 (H)            03/19/2024  MCV                      83.5                03/19/2024                PLT                      365                 03/19/2024              Anesthesia Other Findings All: Codeine, Meperidine   Reproductive/Obstetrics                              Anesthesia Physical Anesthesia Plan  ASA: 3  Anesthesia Plan: Spinal   Post-op Pain Management: Regional block* and Minimal or no pain anticipated   Induction:   PONV Risk Score and Plan: Midazolam , Propofol  infusion and Treatment may vary due to age or medical condition  Airway Management Planned: Natural Airway and Nasal Cannula  Additional Equipment: None  Intra-op Plan:   Post-operative Plan:   Informed Consent: I have reviewed the patients History and Physical, chart, labs and discussed the procedure including the risks, benefits and alternatives for the proposed anesthesia with the patient or authorized representative who has indicated his/her understanding and acceptance.     Dental advisory given  Plan Discussed with: CRNA and Surgeon  Anesthesia Plan Comments: (L Adductor + spinal)         Anesthesia Quick Evaluation

## 2024-03-30 ENCOUNTER — Inpatient Hospital Stay (HOSPITAL_COMMUNITY)
Admission: RE | Admit: 2024-03-30 | Discharge: 2024-03-31 | DRG: 465 | Disposition: A | Discharge status: Home / Self Care | Attending: Orthopedic Surgery | Admitting: Orthopedic Surgery

## 2024-03-30 ENCOUNTER — Inpatient Hospital Stay (HOSPITAL_COMMUNITY): Payer: Self-pay | Admitting: Anesthesiology

## 2024-03-30 ENCOUNTER — Other Ambulatory Visit: Payer: Self-pay

## 2024-03-30 ENCOUNTER — Inpatient Hospital Stay (HOSPITAL_COMMUNITY)

## 2024-03-30 ENCOUNTER — Encounter (HOSPITAL_COMMUNITY): Payer: Self-pay | Admitting: Orthopedic Surgery

## 2024-03-30 ENCOUNTER — Inpatient Hospital Stay (HOSPITAL_COMMUNITY): Payer: Self-pay | Admitting: Physician Assistant

## 2024-03-30 ENCOUNTER — Encounter (HOSPITAL_COMMUNITY)
Admission: RE | Disposition: A | Discharge status: Home / Self Care | Payer: Self-pay | Source: Home / Self Care | Attending: Orthopedic Surgery

## 2024-03-30 DIAGNOSIS — Z7985 Long-term (current) use of injectable non-insulin antidiabetic drugs: Secondary | ICD-10-CM

## 2024-03-30 DIAGNOSIS — K219 Gastro-esophageal reflux disease without esophagitis: Secondary | ICD-10-CM | POA: Diagnosis not present

## 2024-03-30 DIAGNOSIS — Z79899 Other long term (current) drug therapy: Secondary | ICD-10-CM | POA: Diagnosis not present

## 2024-03-30 DIAGNOSIS — E119 Type 2 diabetes mellitus without complications: Secondary | ICD-10-CM

## 2024-03-30 DIAGNOSIS — I10 Essential (primary) hypertension: Secondary | ICD-10-CM | POA: Diagnosis present

## 2024-03-30 DIAGNOSIS — E1169 Type 2 diabetes mellitus with other specified complication: Secondary | ICD-10-CM | POA: Diagnosis not present

## 2024-03-30 DIAGNOSIS — Z8249 Family history of ischemic heart disease and other diseases of the circulatory system: Secondary | ICD-10-CM

## 2024-03-30 DIAGNOSIS — Z809 Family history of malignant neoplasm, unspecified: Secondary | ICD-10-CM | POA: Diagnosis not present

## 2024-03-30 DIAGNOSIS — E669 Obesity, unspecified: Secondary | ICD-10-CM | POA: Diagnosis present

## 2024-03-30 DIAGNOSIS — Y792 Prosthetic and other implants, materials and accessory orthopedic devices associated with adverse incidents: Secondary | ICD-10-CM | POA: Diagnosis present

## 2024-03-30 DIAGNOSIS — Z8261 Family history of arthritis: Secondary | ICD-10-CM | POA: Diagnosis not present

## 2024-03-30 DIAGNOSIS — E7800 Pure hypercholesterolemia, unspecified: Secondary | ICD-10-CM | POA: Diagnosis not present

## 2024-03-30 DIAGNOSIS — I679 Cerebrovascular disease, unspecified: Secondary | ICD-10-CM | POA: Diagnosis not present

## 2024-03-30 DIAGNOSIS — Z79890 Hormone replacement therapy (postmenopausal): Secondary | ICD-10-CM

## 2024-03-30 DIAGNOSIS — T84023A Instability of internal left knee prosthesis, initial encounter: Secondary | ICD-10-CM

## 2024-03-30 DIAGNOSIS — G4733 Obstructive sleep apnea (adult) (pediatric): Secondary | ICD-10-CM | POA: Diagnosis not present

## 2024-03-30 DIAGNOSIS — Z01818 Encounter for other preprocedural examination: Secondary | ICD-10-CM

## 2024-03-30 DIAGNOSIS — G8918 Other acute postprocedural pain: Secondary | ICD-10-CM | POA: Diagnosis not present

## 2024-03-30 DIAGNOSIS — Z471 Aftercare following joint replacement surgery: Secondary | ICD-10-CM | POA: Diagnosis not present

## 2024-03-30 DIAGNOSIS — Z8673 Personal history of transient ischemic attack (TIA), and cerebral infarction without residual deficits: Secondary | ICD-10-CM | POA: Diagnosis not present

## 2024-03-30 DIAGNOSIS — Z7984 Long term (current) use of oral hypoglycemic drugs: Secondary | ICD-10-CM | POA: Diagnosis not present

## 2024-03-30 DIAGNOSIS — Z6839 Body mass index (BMI) 39.0-39.9, adult: Secondary | ICD-10-CM

## 2024-03-30 DIAGNOSIS — Z885 Allergy status to narcotic agent status: Secondary | ICD-10-CM

## 2024-03-30 DIAGNOSIS — Z7982 Long term (current) use of aspirin: Secondary | ICD-10-CM | POA: Diagnosis not present

## 2024-03-30 DIAGNOSIS — Z96652 Presence of left artificial knee joint: Secondary | ICD-10-CM | POA: Diagnosis not present

## 2024-03-30 HISTORY — PX: REIMPLANTATION OF TOTAL KNEE: SHX6052

## 2024-03-30 LAB — GLUCOSE, CAPILLARY
Glucose-Capillary: 114 mg/dL — ABNORMAL HIGH (ref 70–99)
Glucose-Capillary: 90 mg/dL (ref 70–99)
Glucose-Capillary: 117 mg/dL — ABNORMAL HIGH (ref 70–99)
Glucose-Capillary: 187 mg/dL — ABNORMAL HIGH (ref 70–99)
Glucose-Capillary: 147 mg/dL — ABNORMAL HIGH (ref 70–99)

## 2024-03-30 SURGERY — REVISION, TOTAL ARTHROPLASTY, KNEE
Anesthesia: Spinal | Site: Knee | Laterality: Left

## 2024-03-30 MED ORDER — LORATADINE 10 MG PO TABS: 5.0000 mg | ORAL_TABLET | Freq: Every day | ORAL | Status: DC | PRN

## 2024-03-30 MED ORDER — DIPHENHYDRAMINE HCL 12.5 MG/5ML PO ELIX: 12.5000 mg | ORAL_SOLUTION | ORAL | Status: DC | PRN

## 2024-03-30 MED ORDER — PHENOL 1.4 % MT LIQD: 1.0000 | OROMUCOSAL | Status: DC | PRN

## 2024-03-30 MED ORDER — MONTELUKAST SODIUM 10 MG PO TABS: 10.0000 mg | ORAL_TABLET | Freq: Every day | ORAL | Status: DC | PRN

## 2024-03-30 MED ORDER — PROPOFOL 1000 MG/100ML IV EMUL
INTRAVENOUS | Status: AC
  Filled 2024-03-30: qty 100

## 2024-03-30 MED ORDER — METHOCARBAMOL 1000 MG/10ML IJ SOLN: 500.0000 mg | Freq: Four times a day (QID) | INTRAMUSCULAR | Status: DC | PRN

## 2024-03-30 MED ORDER — POLYETHYLENE GLYCOL 3350 17 G PO PACK: 17.0000 g | PACK | Freq: Every day | ORAL | Status: DC | PRN

## 2024-03-30 MED ORDER — ACETAMINOPHEN 500 MG PO TABS
1000.0000 mg | ORAL_TABLET | Freq: Once | ORAL | Status: AC
  Administered 2024-03-30: 1000 mg via ORAL
  Filled 2024-03-30: qty 2

## 2024-03-30 MED ORDER — ASPIRIN 81 MG PO CHEW
81.0000 mg | CHEWABLE_TABLET | Freq: Two times a day (BID) | ORAL | Status: DC
  Administered 2024-03-30 – 2024-03-31 (×2): 81 mg via ORAL
  Filled 2024-03-30 (×2): qty 1

## 2024-03-30 MED ORDER — ROCURONIUM BROMIDE 10 MG/ML (PF) SYRINGE
PREFILLED_SYRINGE | INTRAVENOUS | Status: AC
  Filled 2024-03-30: qty 10

## 2024-03-30 MED ORDER — INSULIN ASPART 100 UNIT/ML IJ SOLN: 0.0000 [IU] | INTRAMUSCULAR | Status: DC | PRN

## 2024-03-30 MED ORDER — METFORMIN HCL ER 750 MG PO TB24
750.0000 mg | ORAL_TABLET | Freq: Every day | ORAL | Status: DC
  Administered 2024-03-30: 750 mg via ORAL
  Filled 2024-03-30 (×2): qty 1

## 2024-03-30 MED ORDER — BUPIVACAINE-EPINEPHRINE (PF) 0.25% -1:200000 IJ SOLN
INTRAMUSCULAR | Status: DC | PRN
  Administered 2024-03-30: 30 mL via PERINEURAL

## 2024-03-30 MED ORDER — OLMESARTAN-AMLODIPINE-HCTZ 40-5-12.5 MG PO TABS: 1.0000 | ORAL_TABLET | Freq: Every day | ORAL | Status: DC

## 2024-03-30 MED ORDER — ADULT MULTIVITAMIN W/MINERALS CH
1.0000 | ORAL_TABLET | Freq: Every day | ORAL | Status: DC
  Administered 2024-03-30: 1 via ORAL
  Filled 2024-03-30: qty 1

## 2024-03-30 MED ORDER — ZOLPIDEM TARTRATE 5 MG PO TABS: 5.0000 mg | ORAL_TABLET | Freq: Every evening | ORAL | Status: DC | PRN

## 2024-03-30 MED ORDER — POVIDONE-IODINE 10 % EX SWAB
2.0000 | Freq: Once | CUTANEOUS | Status: DC
Start: 2024-03-30 — End: 2024-03-30

## 2024-03-30 MED ORDER — ONDANSETRON HCL 4 MG/2ML IJ SOLN: 4.0000 mg | Freq: Once | INTRAMUSCULAR | Status: DC | PRN

## 2024-03-30 MED ORDER — VITAMIN D 25 MCG (1000 UNIT) PO TABS
2000.0000 [IU] | ORAL_TABLET | Freq: Every evening | ORAL | Status: DC
  Administered 2024-03-30: 2000 [IU] via ORAL
  Filled 2024-03-30: qty 2

## 2024-03-30 MED ORDER — ORAL CARE MOUTH RINSE: 15.0000 mL | OROMUCOSAL | Status: DC | PRN

## 2024-03-30 MED ORDER — TRANEXAMIC ACID-NACL 1000-0.7 MG/100ML-% IV SOLN
1000.0000 mg | INTRAVENOUS | Status: AC
Start: 2024-03-30 — End: 2024-03-30
  Administered 2024-03-30: 1000 mg via INTRAVENOUS
  Filled 2024-03-30: qty 100

## 2024-03-30 MED ORDER — ISOPROPYL ALCOHOL 70 % SOLN
Status: DC | PRN
  Administered 2024-03-30: 1 via TOPICAL

## 2024-03-30 MED ORDER — LEVOCETIRIZINE DIHYDROCHLORIDE 5 MG PO TABS: 5.0000 mg | ORAL_TABLET | Freq: Every day | ORAL | Status: DC | PRN

## 2024-03-30 MED ORDER — ONDANSETRON HCL 4 MG PO TABS: 4.0000 mg | ORAL_TABLET | Freq: Four times a day (QID) | ORAL | Status: DC | PRN

## 2024-03-30 MED ORDER — LACTATED RINGERS IV SOLN: INTRAVENOUS | Status: DC

## 2024-03-30 MED ORDER — ACETAMINOPHEN 325 MG PO TABS: 325.0000 mg | ORAL_TABLET | Freq: Four times a day (QID) | ORAL | Status: DC | PRN

## 2024-03-30 MED ORDER — DEXAMETHASONE SODIUM PHOSPHATE 10 MG/ML IJ SOLN
4.0000 mg | Freq: Once | INTRAMUSCULAR | Status: AC
  Administered 2024-03-30: 4 mg via INTRAVENOUS

## 2024-03-30 MED ORDER — DOCUSATE SODIUM 100 MG PO CAPS
100.0000 mg | ORAL_CAPSULE | Freq: Two times a day (BID) | ORAL | Status: DC
  Administered 2024-03-30 – 2024-03-31 (×3): 100 mg via ORAL
  Filled 2024-03-30 (×3): qty 1

## 2024-03-30 MED ORDER — GABAPENTIN 300 MG PO CAPS
300.0000 mg | ORAL_CAPSULE | Freq: Every day | ORAL | Status: DC
  Administered 2024-03-30: 300 mg via ORAL
  Filled 2024-03-30: qty 1

## 2024-03-30 MED ORDER — ACETAMINOPHEN 500 MG PO TABS
1000.0000 mg | ORAL_TABLET | Freq: Four times a day (QID) | ORAL | Status: AC
  Administered 2024-03-30 – 2024-03-31 (×4): 1000 mg via ORAL
  Filled 2024-03-30 (×4): qty 2

## 2024-03-30 MED ORDER — SODIUM CHLORIDE 0.9% FLUSH
INTRAVENOUS | Status: DC | PRN
  Administered 2024-03-30: 30 mL

## 2024-03-30 MED ORDER — 0.9 % SODIUM CHLORIDE (POUR BTL) OPTIME
TOPICAL | Status: DC | PRN
  Administered 2024-03-30: 1000 mL

## 2024-03-30 MED ORDER — METHOCARBAMOL 500 MG PO TABS
500.0000 mg | ORAL_TABLET | Freq: Four times a day (QID) | ORAL | Status: DC | PRN
  Administered 2024-03-30 – 2024-03-31 (×2): 500 mg via ORAL
  Filled 2024-03-30 (×2): qty 1

## 2024-03-30 MED ORDER — KETOROLAC TROMETHAMINE 15 MG/ML IJ SOLN
7.5000 mg | Freq: Four times a day (QID) | INTRAMUSCULAR | Status: AC
  Administered 2024-03-30 – 2024-03-31 (×4): 7.5 mg via INTRAVENOUS
  Filled 2024-03-30 (×4): qty 1

## 2024-03-30 MED ORDER — INSULIN ASPART 100 UNIT/ML IJ SOLN
0.0000 [IU] | Freq: Three times a day (TID) | INTRAMUSCULAR | Status: DC
  Administered 2024-03-30: 3 [IU] via SUBCUTANEOUS

## 2024-03-30 MED ORDER — ACETAMINOPHEN 10 MG/ML IV SOLN: 1000.0000 mg | Freq: Once | INTRAVENOUS | Status: DC | PRN

## 2024-03-30 MED ORDER — AMLODIPINE BESYLATE 5 MG PO TABS
5.0000 mg | ORAL_TABLET | Freq: Every day | ORAL | Status: DC
  Administered 2024-03-31: 5 mg via ORAL
  Filled 2024-03-30: qty 1

## 2024-03-30 MED ORDER — SODIUM CHLORIDE 0.9 % IR SOLN
Status: DC | PRN
  Administered 2024-03-30: 3000 mL

## 2024-03-30 MED ORDER — BUPIVACAINE IN DEXTROSE 0.75-8.25 % IT SOLN
INTRATHECAL | Status: DC | PRN
  Administered 2024-03-30: 2 mL via INTRATHECAL

## 2024-03-30 MED ORDER — ISOPROPYL ALCOHOL 70 % SOLN
Status: AC
Start: 2024-03-30 — End: 2024-03-30
  Filled 2024-03-30: qty 480

## 2024-03-30 MED ORDER — CEFAZOLIN SODIUM-DEXTROSE 2-4 GM/100ML-% IV SOLN
2.0000 g | INTRAVENOUS | Status: AC
  Administered 2024-03-30: 2 g via INTRAVENOUS
  Filled 2024-03-30: qty 100

## 2024-03-30 MED ORDER — FENTANYL CITRATE (PF) 100 MCG/2ML IJ SOLN
INTRAMUSCULAR | Status: AC
  Filled 2024-03-30: qty 2

## 2024-03-30 MED ORDER — PROPOFOL 500 MG/50ML IV EMUL
INTRAVENOUS | Status: DC | PRN
  Administered 2024-03-30: 100 ug/kg/min via INTRAVENOUS

## 2024-03-30 MED ORDER — PROPOFOL 10 MG/ML IV BOLUS
INTRAVENOUS | Status: DC | PRN
  Administered 2024-03-30: 20 mg via INTRAVENOUS

## 2024-03-30 MED ORDER — LATANOPROST 0.005 % OP SOLN
1.0000 [drp] | Freq: Every day | OPHTHALMIC | Status: DC
  Administered 2024-03-30: 1 [drp] via OPHTHALMIC
  Filled 2024-03-30: qty 2.5

## 2024-03-30 MED ORDER — BUPIVACAINE-EPINEPHRINE (PF) 0.25% -1:200000 IJ SOLN
INTRAMUSCULAR | Status: AC
  Filled 2024-03-30: qty 30

## 2024-03-30 MED ORDER — PANTOPRAZOLE SODIUM 40 MG PO TBEC
40.0000 mg | DELAYED_RELEASE_TABLET | Freq: Every day | ORAL | Status: DC
  Administered 2024-03-30 – 2024-03-31 (×2): 40 mg via ORAL
  Filled 2024-03-30 (×2): qty 1

## 2024-03-30 MED ORDER — OXYCODONE HCL 5 MG PO TABS: 5.0000 mg | ORAL_TABLET | Freq: Once | ORAL | Status: DC | PRN

## 2024-03-30 MED ORDER — INSULIN ASPART 100 UNIT/ML IJ SOLN
4.0000 [IU] | Freq: Three times a day (TID) | INTRAMUSCULAR | Status: DC
  Administered 2024-03-30 – 2024-03-31 (×4): 4 [IU] via SUBCUTANEOUS

## 2024-03-30 MED ORDER — CEFAZOLIN SODIUM-DEXTROSE 2-4 GM/100ML-% IV SOLN
2.0000 g | Freq: Three times a day (TID) | INTRAVENOUS | Status: AC
  Administered 2024-03-30 – 2024-03-31 (×3): 2 g via INTRAVENOUS
  Filled 2024-03-30 (×3): qty 100

## 2024-03-30 MED ORDER — BUPIVACAINE LIPOSOME 1.3 % IJ SUSP
INTRAMUSCULAR | Status: AC
Start: 2024-03-30 — End: 2024-03-30
  Filled 2024-03-30: qty 20

## 2024-03-30 MED ORDER — HYDROCHLOROTHIAZIDE 12.5 MG PO TABS
12.5000 mg | ORAL_TABLET | Freq: Every day | ORAL | Status: DC
  Administered 2024-03-31: 12.5 mg via ORAL
  Filled 2024-03-30: qty 1

## 2024-03-30 MED ORDER — OXYCODONE HCL 5 MG/5ML PO SOLN: 5.0000 mg | Freq: Once | ORAL | Status: DC | PRN

## 2024-03-30 MED ORDER — PHENYLEPHRINE HCL-NACL 20-0.9 MG/250ML-% IV SOLN
INTRAVENOUS | Status: DC | PRN
  Administered 2024-03-30: 25 ug/min via INTRAVENOUS

## 2024-03-30 MED ORDER — BUPIVACAINE LIPOSOME 1.3 % IJ SUSP
20.0000 mL | Freq: Once | INTRAMUSCULAR | Status: DC
Start: 2024-03-30 — End: 2024-03-30

## 2024-03-30 MED ORDER — CEFADROXIL 500 MG PO CAPS: 500.0000 mg | ORAL_CAPSULE | Freq: Two times a day (BID) | ORAL | Status: DC

## 2024-03-30 MED ORDER — SODIUM CHLORIDE 0.9 % IV SOLN
INTRAVENOUS | Status: DC
Start: 2024-03-30 — End: 2024-03-30

## 2024-03-30 MED ORDER — HYDROMORPHONE HCL 1 MG/ML IJ SOLN: 0.2500 mg | INTRAMUSCULAR | Status: DC | PRN

## 2024-03-30 MED ORDER — PROPOFOL 10 MG/ML IV BOLUS
INTRAVENOUS | Status: AC
  Filled 2024-03-30: qty 20

## 2024-03-30 MED ORDER — ORAL CARE MOUTH RINSE: 15.0000 mL | Freq: Once | OROMUCOSAL | Status: AC

## 2024-03-30 MED ORDER — IRBESARTAN 150 MG PO TABS
300.0000 mg | ORAL_TABLET | Freq: Every day | ORAL | Status: DC
  Administered 2024-03-31: 300 mg via ORAL
  Filled 2024-03-30 (×2): qty 2

## 2024-03-30 MED ORDER — MENTHOL 3 MG MT LOZG: 1.0000 | LOZENGE | OROMUCOSAL | Status: DC | PRN

## 2024-03-30 MED ORDER — ATORVASTATIN CALCIUM 10 MG PO TABS
10.0000 mg | ORAL_TABLET | ORAL | Status: DC
  Administered 2024-03-30: 10 mg via ORAL
  Filled 2024-03-30: qty 1

## 2024-03-30 MED ORDER — INSULIN ASPART 100 UNIT/ML IJ SOLN: 0.0000 [IU] | Freq: Every day | INTRAMUSCULAR | Status: DC

## 2024-03-30 MED ORDER — CHLORHEXIDINE GLUCONATE 0.12 % MT SOLN
15.0000 mL | Freq: Once | OROMUCOSAL | Status: AC
  Administered 2024-03-30: 15 mL via OROMUCOSAL

## 2024-03-30 MED ORDER — ONDANSETRON HCL 4 MG/2ML IJ SOLN
INTRAMUSCULAR | Status: DC | PRN
  Administered 2024-03-30: 4 mg via INTRAVENOUS

## 2024-03-30 MED ORDER — ONDANSETRON HCL 4 MG/2ML IJ SOLN: 4.0000 mg | Freq: Four times a day (QID) | INTRAMUSCULAR | Status: DC | PRN

## 2024-03-30 MED ORDER — HYDROMORPHONE HCL 1 MG/ML IJ SOLN
0.5000 mg | INTRAMUSCULAR | Status: DC | PRN
  Administered 2024-03-30: 0.5 mg via INTRAVENOUS
  Filled 2024-03-30: qty 1

## 2024-03-30 MED ORDER — BUPIVACAINE LIPOSOME 1.3 % IJ SUSP
INTRAMUSCULAR | Status: DC | PRN
  Administered 2024-03-30: 20 mL

## 2024-03-30 MED ORDER — WATER FOR IRRIGATION, STERILE IR SOLN
Status: DC | PRN
  Administered 2024-03-30: 2000 mL

## 2024-03-30 MED ORDER — DEXAMETHASONE SODIUM PHOSPHATE 10 MG/ML IJ SOLN
INTRAMUSCULAR | Status: AC
  Filled 2024-03-30: qty 1

## 2024-03-30 MED ORDER — ONDANSETRON HCL 4 MG/2ML IJ SOLN
INTRAMUSCULAR | Status: AC
  Filled 2024-03-30: qty 2

## 2024-03-30 MED ORDER — OXYCODONE HCL 5 MG PO TABS
5.0000 mg | ORAL_TABLET | ORAL | Status: DC | PRN
  Administered 2024-03-30 (×2): 10 mg via ORAL
  Administered 2024-03-30: 5 mg via ORAL
  Administered 2024-03-31: 10 mg via ORAL
  Filled 2024-03-30 (×4): qty 2

## 2024-03-30 MED ORDER — FENTANYL CITRATE (PF) 100 MCG/2ML IJ SOLN
INTRAMUSCULAR | Status: DC | PRN
  Administered 2024-03-30 (×2): 50 ug via INTRAVENOUS

## 2024-03-30 MED ORDER — SODIUM CHLORIDE (PF) 0.9 % IJ SOLN
INTRAMUSCULAR | Status: AC
  Filled 2024-03-30: qty 30

## 2024-03-30 SURGICAL SUPPLY — 50 items
BAG COUNTER SPONGE SURGICOUNT (BAG) IMPLANT
BLADE SAG 18X100X1.27 (BLADE) ×1 IMPLANT
BLADE SAW SAG 35X64 .89 (BLADE) ×1 IMPLANT
BLADE SAW SGTL 81X20 HD (BLADE) IMPLANT
BNDG COHESIVE 4X5 TAN STRL LF (GAUZE/BANDAGES/DRESSINGS) ×1 IMPLANT
BNDG ELASTIC 6X10 VLCR STRL LF (GAUZE/BANDAGES/DRESSINGS) ×1 IMPLANT
BNDG ELASTIC 6X15 VLCR STRL LF (GAUZE/BANDAGES/DRESSINGS) IMPLANT
BOWL SMART MIX CTS (DISPOSABLE) IMPLANT
CANISTER WOUND CARE 500ML ATS (WOUND CARE) ×1 IMPLANT
CHLORAPREP W/TINT 26 (MISCELLANEOUS) ×2 IMPLANT
CNTNR URN SCR LID CUP LEK RST (MISCELLANEOUS) IMPLANT
COVER SURGICAL LIGHT HANDLE (MISCELLANEOUS) ×1 IMPLANT
CUFF TRNQT CYL 34X4.125X (TOURNIQUET CUFF) ×1 IMPLANT
DRAPE INCISE IOBAN 85X60 (DRAPES) ×1 IMPLANT
DRAPE SHEET LG 3/4 BI-LAMINATE (DRAPES) ×1 IMPLANT
DRAPE U-SHAPE 47X51 STRL (DRAPES) ×1 IMPLANT
DRESSING PEEL AND PLAC PRVNA20 (GAUZE/BANDAGES/DRESSINGS) ×1 IMPLANT
DRESSING PREVENA PLUS CUSTOM (GAUZE/BANDAGES/DRESSINGS) IMPLANT
ELECT REM PT RETURN 15FT ADLT (MISCELLANEOUS) ×1 IMPLANT
GAUZE SPONGE 4X4 12PLY STRL (GAUZE/BANDAGES/DRESSINGS) ×1 IMPLANT
GLOVE BIO SURGEON STRL SZ 6.5 (GLOVE) ×2 IMPLANT
GLOVE BIOGEL PI IND STRL 6.5 (GLOVE) ×1 IMPLANT
GLOVE BIOGEL PI IND STRL 8 (GLOVE) ×1 IMPLANT
GLOVE SURG ORTHO 8.0 STRL STRW (GLOVE) ×2 IMPLANT
GOWN STRL REUS W/ TWL XL LVL3 (GOWN DISPOSABLE) ×2 IMPLANT
HOLDER FOLEY CATH W/STRAP (MISCELLANEOUS) IMPLANT
HOOD PEEL AWAY T7 (MISCELLANEOUS) ×3 IMPLANT
INSERT ARTISURF PERS SZ 4-5 LT (Insert) IMPLANT
KIT DRSG PREVENA PLUS 7DAY 125 (MISCELLANEOUS) ×1 IMPLANT
KIT TURNOVER KIT A (KITS) ×1 IMPLANT
MANIFOLD NEPTUNE II (INSTRUMENTS) ×1 IMPLANT
MARKER SKIN DUAL TIP RULER LAB (MISCELLANEOUS) ×1 IMPLANT
NS IRRIG 1000ML POUR BTL (IV SOLUTION) ×1 IMPLANT
PACK TOTAL KNEE CUSTOM (KITS) ×1 IMPLANT
PENCIL SMOKE EVACUATOR (MISCELLANEOUS) ×1 IMPLANT
PROTECTOR NERVE ULNAR (MISCELLANEOUS) ×1 IMPLANT
SET HNDPC FAN SPRY TIP SCT (DISPOSABLE) ×1 IMPLANT
SOLUTION IRRIG SURGIPHOR (IV SOLUTION) IMPLANT
SOLUTION PRONTOSAN WOUND 350ML (IRRIGATION / IRRIGATOR) IMPLANT
SPIKE FLUID TRANSFER (MISCELLANEOUS) ×1 IMPLANT
SUT ETHILON 3 0 PS 1 (SUTURE) ×4 IMPLANT
SUT STRATAFIX 14 PDO 48 VLT (SUTURE) ×1 IMPLANT
SUT VIC AB 0 CT1 36 (SUTURE) ×1 IMPLANT
SUT VIC AB 1 CT1 36 (SUTURE) IMPLANT
SUT VIC AB 2-0 CT2 27 (SUTURE) ×2 IMPLANT
SUTURE STRATFX 0 PDS 27 VIOLET (SUTURE) ×1 IMPLANT
SYR 50ML LL SCALE MARK (SYRINGE) ×1 IMPLANT
TRAY FOLEY MTR SLVR 16FR STAT (SET/KITS/TRAYS/PACK) ×1 IMPLANT
TUBE SUCTION HIGH CAP CLEAR NV (SUCTIONS) ×1 IMPLANT
UNDERPAD 30X36 HEAVY ABSORB (UNDERPADS AND DIAPERS) ×1 IMPLANT

## 2024-03-30 NOTE — Interval H&P Note (Signed)
 The patient has been re-examined, and the chart reviewed, and there have been no interval changes to the documented history and physical.    Plan for Left knee revision liner exchange  The operative side was examined and the patient was confirmed to have sensation to DPN, SPN, TN intact, Motor EHL, ext, flex 5/5, and DP 2+, PT 2+, No significant edema.   The risks, benefits, and alternatives have been discussed at length with patient, and the patient is willing to proceed.  Left knee marked. Consent has been signed.

## 2024-03-30 NOTE — Anesthesia Procedure Notes (Signed)
 Spinal  Patient location during procedure: OR Start time: 03/30/2024 7:28 AM End time: 03/30/2024 7:42 AM Reason for block: surgical anesthesia Staffing Performed: anesthesiologist  Anesthesiologist: Jefm Garnette LABOR, MD Performed by: Jefm Garnette LABOR, MD Authorized by: Jefm Garnette LABOR, MD   Preanesthetic Checklist Completed: patient identified, IV checked, risks and benefits discussed, surgical consent, monitors and equipment checked, pre-op evaluation and timeout performed Spinal Block Patient position: sitting Prep: DuraPrep and site prepped and draped Patient monitoring: heart rate, cardiac monitor, continuous pulse ox and blood pressure Approach: midline Location: L3-4 Injection technique: single-shot Needle Needle type: Pencan  Needle gauge: 24 G Needle length: 10 cm Needle insertion depth: 8 cm Assessment Sensory level: T4 Events: CSF return and second provider Additional Notes  3 attemptsby CrNA, 2 Attempt (s) by 2nd providers. Pt tolerated procedure well.

## 2024-03-30 NOTE — Progress Notes (Signed)
 Orthopedic Tech Progress Note Patient Details:  Xian Apostol [facility] September 13, 1950 [id number] Applied bone foam per order.  Ortho Devices Type of Ortho Device: Bone foam zero knee Ortho Device/Splint Location: LLE Ortho Device/Splint Interventions: Ordered, Application, Adjustment   Post Interventions Patient Tolerated: Well Instructions Provided: Adjustment of device, Care of device, Poper ambulation with device  Morna Pink 03/30/2024, 10:17 AM

## 2024-03-30 NOTE — Evaluation (Addendum)
 Physical Therapy Evaluation Patient Details Name: [name] MRN: [id number] DOB: 07-Feb-1951 Today's Date: 03/30/2024  History of Present Illness  73 y.o. female admitted 03/30/24 with L knee instability. s/p  Revision left total knee arthroplasty liner exchange, synovectomy, scar excision, application of wound VAC.  PMH: L TKA 08/05/23, DM 2, HLD, chronic pain, HTN, GERD, OSA, CVA, B foot surgery, aortic atherosclerosis.  Clinical Impression  Pt is s/p TKA revision resulting in the deficits listed below (see PT Problem List). Pt is mobilizing well, she ambulated 34' with RW, no loss of balance. Initiated TKA HEP. Good progress expected.  Pt will benefit from acute skilled PT to increase their independence and safety with mobility to allow discharge.          If plan is discharge home, recommend the following: A little help with walking and/or transfers;A little help with bathing/dressing/bathroom;Assistance with cooking/housework;Help with stairs or ramp for entrance   Can travel by private vehicle        Equipment Recommendations None recommended by PT  Recommendations for Other Services       Functional Status Assessment Patient has had a recent decline in their functional status and demonstrates the ability to make significant improvements in function in a reasonable and predictable amount of time.     Precautions / Restrictions Precautions Precautions: Knee;Fall; wound VAC L knee Recall of Precautions/Restrictions: Intact Precaution/Restrictions Comments: reviewed no pillow under knee Restrictions Weight Bearing Restrictions Per Provider Order: No Other Position/Activity Restrictions: WBAT      Mobility  Bed Mobility Overal bed mobility: Modified Independent             General bed mobility comments: HOB up, used rail    Transfers Overall transfer level: Needs assistance Equipment used: Rolling walker (2 wheels) Transfers: Sit to/from Stand Sit to Stand: Min  assist           General transfer comment: VCs hand placement, min A to power up    Ambulation/Gait Ambulation/Gait assistance: Contact guard assist Gait Distance (Feet): 55 Feet Assistive device: Rolling walker (2 wheels) Gait Pattern/deviations: Step-to pattern, Decreased step length - right, Decreased step length - left Gait velocity: decr     General Gait Details: VCs sequencing initially, no loss of balance  Stairs            Wheelchair Mobility     Tilt Bed    Modified Rankin (Stroke Patients Only)       Balance Overall balance assessment: Modified Independent                                           Pertinent Vitals/Pain Pain Assessment Pain Assessment: 0-10 Pain Score: 6  Pain Location: L knee Pain Descriptors / Indicators: Sore Pain Intervention(s): Limited activity within patient's tolerance, Monitored during session, Premedicated before session, Repositioned, Ice applied    Home Living Family/patient expects to be discharged to:: Private residence Living Arrangements: Alone   Type of Home: House Home Access: Stairs to enter Entrance Stairs-Rails: None Entrance Stairs-Number of Steps: 2   Home Layout: One level Home Equipment: Cane - single point;Standard Environmental consultant;Shower seat;Grab bars - Chartered loss adjuster (2 wheels) Additional Comments: daughter to assist    Prior Function Prior Level of Function : Independent/Modified Independent;Driving             Mobility Comments: walked without AD, no falls in  past 6 months ADLs Comments: independent     Extremity/Trunk Assessment   Upper Extremity Assessment Upper Extremity Assessment: Overall WFL for tasks assessed    Lower Extremity Assessment Lower Extremity Assessment: LLE deficits/detail LLE Deficits / Details: knee ext 3/5, SLR -3/5 LLE Sensation: WNL LLE Coordination: WNL    Cervical / Trunk Assessment Cervical / Trunk Assessment: Normal   Communication   Communication Communication: No apparent difficulties    Cognition Arousal: Alert Behavior During Therapy: WFL for tasks assessed/performed   PT - Cognitive impairments: No apparent impairments                         Following commands: Intact       Cueing       General Comments      Exercises Total Joint Exercises Ankle Circles/Pumps: AROM, Both, 10 reps, Supine   Assessment/Plan    PT Assessment Patient needs continued PT services  PT Problem List Decreased strength;Decreased range of motion;Decreased activity tolerance;Decreased mobility;Pain       PT Treatment Interventions Gait training;DME instruction;Therapeutic exercise;Patient/family education    PT Goals (Current goals can be found in the Care Plan section)  Acute Rehab PT Goals Patient Stated Goal: gardening PT Goal Formulation: With patient Time For Goal Achievement: 04/06/24 Potential to Achieve Goals: Good    Frequency 7X/week     Co-evaluation               AM-PAC PT 6 Clicks Mobility  Outcome Measure Help needed turning from your back to your side while in a flat bed without using bedrails?: A Little Help needed moving from lying on your back to sitting on the side of a flat bed without using bedrails?: A Little Help needed moving to and from a bed to a chair (including a wheelchair)?: A Little Help needed standing up from a chair using your arms (e.g., wheelchair or bedside chair)?: A Little Help needed to walk in hospital room?: A Little Help needed climbing 3-5 steps with a railing? : A Lot 6 Click Score: 17    End of Session Equipment Utilized During Treatment: Gait belt Activity Tolerance: Patient tolerated treatment well Patient left: in chair;with chair alarm set;with call bell/phone within reach Nurse Communication: Mobility status PT Visit Diagnosis: Difficulty in walking, not elsewhere classified (R26.2);Pain Pain - Right/Left: Left Pain -  part of body: Knee    Time: 1350-1414 PT Time Calculation (min) (ACUTE ONLY): 24 min   Charges:   PT Evaluation $PT Eval Moderate Complexity: 1 Mod PT Treatments $Gait Training: 8-22 mins PT General Charges $$ ACUTE PT VISIT: 1 Visit        Sylvan Nest Kistler PT 03/30/2024  Acute Rehabilitation Services  Office [phone]

## 2024-03-30 NOTE — Op Note (Signed)
 DATE OF SURGERY:  03/30/2024 TIME: 9:03 AM  PATIENT NAME:  [name]   AGE: 73 y.o.    PRE-OPERATIVE DIAGNOSIS: Left knee instability  POST-OPERATIVE DIAGNOSIS:  Same  PROCEDURE: Revision left total knee arthroplasty liner exchange, synovectomy, scar excision, application of wound VAC  SURGEON:  Bradon Fester A Calista Crain, MD   ASSISTANT: Jon Hurst, RNFA, present and scrubbed throughout the case, critical for assistance with exposure, retraction, instrumentation, and closure.   OPERATIVE IMPLANTS:  Implant Name Type Inv. Item Serial No. Manufacturer Lot No. LRB No. Used Action  INSERT ARTISURF PERS SZ 4-5 LT - [id number] Insert INSERT ARTISURF PERS SZ 4-5 LT  [employer](ORTH,TRAU,BIO,SG) [id number] Left 1 Implanted  INSERT ARTISURF PERS SZ 4-5 LT - [id number] Insert INSERT ARTISURF PERS SZ 4-5 LT  [employer](ORTH,TRAU,BIO,SG) [id number] Left 1 Implanted      PREOPERATIVE INDICATIONS:  [name] is a 73 y.o. year old female who had undergone left total knee arthroplasty January 20th, 2025 for primary osteoarthritis.  She struggled after surgery regaining her strength and mobility after the knee.  She has had intermittent episodes of buckling and instability.  Given concerns for excessive laxity in her knee in both flexion and extension felt patient would benefit from revision of her knee arthroplasty to a larger liner to improve stability.  The risks, benefits, and alternatives were discussed at length including but not limited to the risks of infection, bleeding, nerve injury, stiffness, blood clots, the need for revision surgery, cardiopulmonary complications, among others, and they were willing to proceed.  OPERATIVE FINDINGS AND UNIQUE ASPECTS OF THE CASE: Relative laxity in both extension and flexion with the 11 mm insert, this improved with the 14 mm insert.  No evidence of purulence or infection of the knee.  1 soft tissue synovial specimen was sent for aerobic anaerobic  culture  ESTIMATED BLOOD LOSS: 50cc  OPERATIVE DESCRIPTION:   Once adequate anesthesia was induced, preoperative antibiotics, 2 gm of ancef ,1 gm of Tranexamic Acid , and 8 mg of Decadron  administered, the patient was positioned supine with a left thigh tourniquet placed.  The left lower extremity was prepped and draped in sterile fashion.  A time-  out was performed identifying the patient, planned procedure, and the appropriate extremity.     The leg was  exsanguinated, tourniquet elevated to 250 mmHg.  The patient had a large keloid scar.  This was carefully excised on both sides.  Following the initial exposure a parapatellar medial arthrotomy was performed.  A medial release was performed, the infrapatellar fat pad was resected with care taken to protect the patellar tendon.  Small amount of clear synovial fluid was present.  Circumferential synovectomy was performed to excise any adhesions and scar tissue.  1 soft tissue specimen was sent for aerobic anaerobic culture.  Following initial  exposure, I felt that there was excessive medial laxity in both flexion and extension as well as some hyperextension.  Tibial, femoral, patellar components were assessed and found to be well-fixed and well-positioned.  We then carefully remove the old poly insert without damaging the tibial tray.  There was no damage or wear to the old poly.  The knee was then irrigated with dilute Betadine  and normal saline as well as pulse lavage.  We then turned our attention to trialing of the 13, 14, and 16 mm inserts.  We felt that the 16 mm insert was too tight especially in flexion resulting in tray lift off.  The 14 mm insert  had full extension and improved stability varus valgus and anterior posterior drawer.    the final 14 mm MC poly insert was chosen.  It was placed into the knee.         The tourniquet had been let down.  No significant hemostasis was required.  The medial parapatellar arthrotomy was then  reapproximated using #1 Vicryl and #1 Stratafix sutures with the knee  in flexion.  The remaining wound was closed with 0 stratafix, 2-0 Vicryl, and running 3-0 nylon. The knee was cleaned, dried, dressed sterilely using an incisional prevena wound VAC.  The patient was then brought to recovery room in stable condition, tolerating the procedure  well. There were no complications.   Post op recs: WB: WBAT Abx: ancef  Imaging: PACU xrays DVT prophylaxis: Aspirin  81mg  BID x4 weeks Follow up: 1 week after surgery for a wound check with Dr. Edna at [facility].  Address: [address], [place], [place] [address]  Office Phone: [phone]  Toribio Edna, MD Orthopaedic Surgery

## 2024-03-30 NOTE — Anesthesia Postprocedure Evaluation (Signed)
 Anesthesia Post Note  Patient: Cristy Colmenares Melberg  Procedure(s) Performed: REVISION, TOTAL ARTHROPLASTY, KNEE (Left: Knee)     Patient location during evaluation: Nursing Unit Anesthesia Type: Spinal Level of consciousness: oriented and awake and alert Pain management: pain level controlled Vital Signs Assessment: post-procedure vital signs reviewed and stable Respiratory status: spontaneous breathing and respiratory function stable Cardiovascular status: blood pressure returned to baseline and stable Postop Assessment: no headache, no backache, no apparent nausea or vomiting and patient able to bend at knees Anesthetic complications: no   No notable events documented.  Last Vitals:  Vitals:   03/30/24 1110 03/30/24 1313  BP: (!) 150/84 124/67  Pulse: 74 83  Resp: 15 15  Temp: 36.4 C 36.6 C  SpO2: 96% 96%    Last Pain:  Vitals:   03/30/24 1313  TempSrc: Oral  PainSc:                  Garnette DELENA Gab

## 2024-03-30 NOTE — Discharge Instructions (Signed)
 INSTRUCTIONS AFTER JOINT REPLACEMENT   Remove items at home which could result in a fall. This includes throw rugs or furniture in walking pathways ICE to the affected joint every three hours while awake for 30 minutes at a time, for at least the first 3-5 days, and then as needed for pain and swelling.  Continue to use ice for pain and swelling. You may notice swelling that will progress down to the foot and ankle.  This is normal after surgery.  Elevate your leg when you are not up walking on it.   Continue to use the breathing machine you got in the hospital (incentive spirometer) which will help keep your temperature down.  It is common for your temperature to cycle up and down following surgery, especially at night when you are not up moving around and exerting yourself.  The breathing machine keeps your lungs expanded and your temperature down.   DIET:  As you were doing prior to hospitalization, we recommend a well-balanced diet.  DRESSING / WOUND CARE / SHOWERING  Keep the surgical dressing until follow up.  The dressing is water proof, so you can shower without any extra covering.  IF THE DRESSING FALLS OFF or the wound gets wet inside, change the dressing with sterile gauze.  Please use good hand washing techniques before changing the dressing.  Do not use any lotions or creams on the incision until instructed by your surgeon.    ACTIVITY  Increase activity slowly as tolerated, but follow the weight bearing instructions below.   No driving for 6 weeks or until further direction given by your physician.  You cannot drive while taking narcotics.  No lifting or carrying greater than 10 lbs. until further directed by your surgeon. Avoid periods of inactivity such as sitting longer than an hour when not asleep. This helps prevent blood clots.  You may return to work once you are authorized by your doctor.     WEIGHT BEARING   Weight bearing as tolerated with assist device (walker, cane,  etc) as directed, use it as long as suggested by your surgeon or therapist, typically at least 4-6 weeks.   EXERCISES  Results after joint replacement surgery are often greatly improved when you follow the exercise, range of motion and muscle strengthening exercises prescribed by your doctor. Safety measures are also important to protect the joint from further injury. Any time any of these exercises cause you to have increased pain or swelling, decrease what you are doing until you are comfortable again and then slowly increase them. If you have problems or questions, call your caregiver or physical therapist for advice.   Rehabilitation is important following a joint replacement. After just a few days of immobilization, the muscles of the leg can become weakened and shrink (atrophy).  These exercises are designed to build up the tone and strength of the thigh and leg muscles and to improve motion. Often times heat used for twenty to thirty minutes before working out will loosen up your tissues and help with improving the range of motion but do not use heat for the first two weeks following surgery (sometimes heat can increase post-operative swelling).   These exercises can be done on a training (exercise) mat, on the floor, on a table or on a bed. Use whatever works the best and is most comfortable for you.    Use music or television while you are exercising so that the exercises are a pleasant break in your  day. This will make your life better with the exercises acting as a break in your routine that you can look forward to.   Perform all exercises about fifteen times, three times per day or as directed.  You should exercise both the operative leg and the other leg as well.  Exercises include:   Quad Sets - Tighten up the muscle on the front of the thigh (Quad) and hold for 5-10 seconds.   Straight Leg Raises - With your knee straight (if you were given a brace, keep it on), lift the leg to 60  degrees, hold for 3 seconds, and slowly lower the leg.  Perform this exercise against resistance later as your leg gets stronger.  Leg Slides: Lying on your back, slowly slide your foot toward your buttocks, bending your knee up off the floor (only go as far as is comfortable). Then slowly slide your foot back down until your leg is flat on the floor again.  Angel Wings: Lying on your back spread your legs to the side as far apart as you can without causing discomfort.  Hamstring Strength:  Lying on your back, push your heel against the floor with your leg straight by tightening up the muscles of your buttocks.  Repeat, but this time bend your knee to a comfortable angle, and push your heel against the floor.  You may put a pillow under the heel to make it more comfortable if necessary.   A rehabilitation program following joint replacement surgery can speed recovery and prevent re-injury in the future due to weakened muscles. Contact your doctor or a physical therapist for more information on knee rehabilitation.    CONSTIPATION  Constipation is defined medically as fewer than three stools per week and severe constipation as less than one stool per week.  Even if you have a regular bowel pattern at home, your normal regimen is likely to be disrupted due to multiple reasons following surgery.  Combination of anesthesia, postoperative narcotics, change in appetite and fluid intake all can affect your bowels.   YOU MUST use at least one of the following options; they are listed in order of increasing strength to get the job done.  They are all available over the counter, and you may need to use some, POSSIBLY even all of these options:    Drink plenty of fluids (prune juice may be helpful) and high fiber foods Colace 100 mg by mouth twice a day  Senokot for constipation as directed and as needed Dulcolax (bisacodyl), take with full glass of water  Miralax (polyethylene glycol) once or twice a day as  needed.  If you have tried all these things and are unable to have a bowel movement in the first 3-4 days after surgery call either your surgeon or your primary doctor.    If you experience loose stools or diarrhea, hold the medications until you stool forms back up.  If your symptoms do not get better within 1 week or if they get worse, check with your doctor.  If you experience "the worst abdominal pain ever" or develop nausea or vomiting, please contact the office immediately for further recommendations for treatment.   ITCHING:  If you experience itching with your medications, try taking only a single pain pill, or even half a pain pill at a time.  You can also use Benadryl over the counter for itching or also to help with sleep.   TED HOSE STOCKINGS:  Use stockings on both  legs until for at least 2 weeks or as directed by physician office. They may be removed at night for sleeping.  MEDICATIONS:  See your medication summary on the "After Visit Summary" that nursing will review with you.  You may have some home medications which will be placed on hold until you complete the course of blood thinner medication.  It is important for you to complete the blood thinner medication as prescribed.   Blood clot prevention (DVT Prophylaxis): After surgery you are at an increased risk for a blood clot. you were prescribed a blood thinner, Aspirin 81mg , to be taken twice daily for a total of 4 weeks from surgery to help reduce your risk of getting a blood clot. This will help prevent a blood clot. Signs of a pulmonary embolus (blood clot in the lungs) include sudden short of breath, feeling lightheaded or dizzy, chest pain with a deep breath, rapid pulse rapid breathing. Signs of a blood clot in your arms or legs include new unexplained swelling and cramping, warm, red or darkened skin around the painful area. Please call the office or 911 right away if these signs or symptoms develop.  PRECAUTIONS:  If you  experience chest pain or shortness of breath - call 911 immediately for transfer to the hospital emergency department.   If you develop a fever greater that 101 F, purulent drainage from wound, increased redness or drainage from wound, foul odor from the wound/dressing, or calf pain - CONTACT YOUR SURGEON.                                                   FOLLOW-UP APPOINTMENTS:  If you do not already have a post-op appointment, please call the office for an appointment to be seen by your surgeon.  Guidelines for how soon to be seen are listed in your "After Visit Summary", but are typically between 2-3 weeks after surgery.   POST-OPERATIVE OPIOID TAPER INSTRUCTIONS: It is important to wean off of your opioid medication as soon as possible. If you do not need pain medication after your surgery it is ok to stop day one. Opioids include: Codeine, Hydrocodone(Norco, Vicodin), Oxycodone(Percocet, oxycontin) and hydromorphone amongst others.  Long term and even short term use of opiods can cause: Increased pain response Dependence Constipation Depression Respiratory depression And more.  Withdrawal symptoms can include Flu like symptoms Nausea, vomiting And more Techniques to manage these symptoms Hydrate well Eat regular healthy meals Stay active Use relaxation techniques(deep breathing, meditating, yoga) Do Not substitute Alcohol to help with tapering If you have been on opioids for less than two weeks and do not have pain than it is ok to stop all together.  Plan to wean off of opioids This plan should start within one week post op of your joint replacement. Maintain the same interval or time between taking each dose and first decrease the dose.  Cut the total daily intake of opioids by one tablet each day Next start to increase the time between doses. The last dose that should be eliminated is the evening dose.   MAKE SURE YOU:  Understand these instructions.  Get help right away  if you are not doing well or get worse.    Thank you for letting us be a part of your medical care team.  It is a privilege  we respect greatly.  We hope these instructions will help you stay on track for a fast and full recovery!

## 2024-03-30 NOTE — Transfer of Care (Signed)
 Immediate Anesthesia Transfer of Care Note  Patient: [name]  Procedure(s) Performed: REVISION, TOTAL ARTHROPLASTY, KNEE (Left: Knee)  Patient Location: PACU  Anesthesia Type:Spinal  Level of Consciousness: awake, alert , and oriented  Airway & Oxygen Therapy: Patient Spontanous Breathing and Patient connected to face mask oxygen  Post-op Assessment: Report given to RN and Post -op Vital signs reviewed and stable  Post vital signs: Reviewed and stable  Last Vitals:  Vitals Value Taken Time  BP 132/75 03/30/24 09:21  Temp    Pulse 78 03/30/24 09:23  Resp 16 03/30/24 09:23  SpO2 100 % 03/30/24 09:23  Vitals shown include unfiled device data.  Last Pain:  Vitals:   03/30/24 0602  TempSrc:   PainSc: 0-No pain         Complications: No notable events documented.

## 2024-03-31 ENCOUNTER — Telehealth: Payer: Self-pay

## 2024-03-31 LAB — BASIC METABOLIC PANEL WITH GFR
Anion gap: 10 (ref 5–15)
BUN: 15 mg/dL (ref 8–23)
CO2: 22 mmol/L (ref 22–32)
Calcium: 9.4 mg/dL (ref 8.9–10.3)
Chloride: 105 mmol/L (ref 98–111)
Creatinine, Ser: 0.57 mg/dL (ref 0.44–1.00)
GFR, Estimated: >60 mL/min (ref >60)
Glucose, Bld: 98 mg/dL (ref 70–99)
Potassium: 4 mmol/L (ref 3.5–5.1)
Sodium: 137 mmol/L (ref 135–145)

## 2024-03-31 LAB — CBC
HCT: 44.2 % (ref 36.0–46.0)
Hemoglobin: 12.9 g/dL (ref 12.0–15.0)
MCH: 25 pg — ABNORMAL LOW (ref 26.0–34.0)
MCHC: 29.2 g/dL — ABNORMAL LOW (ref 30.0–36.0)
MCV: 85.5 fL (ref 80.0–100.0)
Platelets: 332 K/uL (ref 150–400)
RBC: 5.17 MIL/uL — ABNORMAL HIGH (ref 3.87–5.11)
RDW: 14.4 % (ref 11.5–15.5)
WBC: 9.3 K/uL (ref 4.0–10.5)
nRBC: 0 % (ref 0.0–0.2)

## 2024-03-31 LAB — GLUCOSE, CAPILLARY
Glucose-Capillary: 92 mg/dL (ref 70–99)
Glucose-Capillary: 112 mg/dL — ABNORMAL HIGH (ref 70–99)

## 2024-03-31 MED ORDER — OXYCODONE HCL 5 MG PO TABS: 5.0000 mg | ORAL_TABLET | ORAL | 0 refills | Status: AC | PRN

## 2024-03-31 MED ORDER — ASPIRIN 81 MG PO TBEC: 81.0000 mg | DELAYED_RELEASE_TABLET | Freq: Two times a day (BID) | ORAL | Status: AC

## 2024-03-31 MED ORDER — CELECOXIB 100 MG PO CAPS: 100.0000 mg | ORAL_CAPSULE | Freq: Two times a day (BID) | ORAL | 0 refills | Status: AC

## 2024-03-31 MED ORDER — METHOCARBAMOL 500 MG PO TABS
500.0000 mg | ORAL_TABLET | Freq: Three times a day (TID) | ORAL | 0 refills | Status: AC | PRN
Start: 2024-03-31 — End: 2024-04-10

## 2024-03-31 MED ORDER — ONDANSETRON HCL 4 MG PO TABS: 4.0000 mg | ORAL_TABLET | Freq: Three times a day (TID) | ORAL | 0 refills | Status: AC | PRN

## 2024-03-31 NOTE — TOC Transition Note (Signed)
 Transition of Care [facility]) - Discharge Note   Patient Details  Name: [name] MRN: [id number] Date of Birth: November 05, 1950  Transition of Care [facility]) CM/SW Contact:  NORMAN ASPEN, LCSW Phone Number: 03/31/2024, 11:04 AM   Clinical Narrative:     Met with pt who confirms she has needed DME in the home.  OPPT already arranged with [facility].  No further TOC needs.  Final next level of care: OP Rehab Barriers to Discharge: No Barriers Identified   Patient Goals and CMS Choice Patient states their goals for this hospitalization and ongoing recovery are:: return home          Discharge Placement                       Discharge Plan and Services Additional resources added to the After Visit Summary for                  DME Arranged: N/A DME Agency: NA                  Social Drivers of Health (SDOH) Interventions SDOH Screenings   Food Insecurity: No Food Insecurity (03/30/2024)  Housing: Low Risk  (03/30/2024)  Transportation Needs: No Transportation Needs (03/30/2024)  Utilities: Not At Risk (03/30/2024)  Alcohol  Screen: Low Risk  (02/29/2024)  Depression (PHQ2-9): Low Risk  (03/04/2024)  Financial Resource Strain: Low Risk  (02/29/2024)  Physical Activity: Unknown (02/29/2024)  Social Connections: Moderately Integrated (03/30/2024)  Stress: No Stress Concern Present (02/29/2024)  Tobacco Use: Low Risk  (03/30/2024)  Health Literacy: Adequate Health Literacy (03/04/2024)     Readmission Risk Interventions    03/31/2024   11:03 AM  Readmission Risk Prevention Plan  Post Dischage Appt Complete  Medication Screening Complete  Transportation Screening Complete

## 2024-03-31 NOTE — Discharge Summary (Signed)
 Physician Discharge Summary  Patient ID: EVANTHIA MAUND MRN: [id number] DOB/AGE: 04-15-1951 73 y.o.  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    She was given perioperative antibiotics:  Anti-infectives (From admission, onward)    Start     Dose/Rate Route Frequency Ordered Stop   03/31/24 1800  cefadroxil  (DURICEF) capsule 500 mg        500 mg Oral 2 times daily 03/30/24 1108 04/07/24 2159   03/30/24 1500  ceFAZolin  (ANCEF ) IVPB 2g/100 mL premix        2 g 200 mL/hr over 30 Minutes Intravenous Every 8 hours 03/30/24 1108 03/31/24 0706   03/30/24 0600  ceFAZolin  (ANCEF ) IVPB 2g/100 mL premix        2 g 200 mL/hr over 30 Minutes Intravenous On call to O.R. 03/30/24 9465 03/30/24 0744     .  She was given sequential compression devices, early ambulation, and aspirin  for DVT prophylaxis.  She benefited maximally from the hospital stay and there were no complications.    Recent vital signs:  Vitals:   03/31/24 0129 03/31/24 0627  BP: (!) 143/84 (!) 141/81  Pulse: (!) 59 62  Resp: 18  18  Temp: (!) 97.4 F (36.3 C) 97.6 F (36.4 C)  SpO2: 99% 98%    Recent laboratory studies:  Lab Results  Component Value Date   HGB 12.9 03/31/2024   HGB 14.2 03/19/2024   HGB 14.1 07/31/2023   Lab Results  Component Value Date   WBC 9.3 03/31/2024   PLT 332 03/31/2024   No results found for: INR Lab Results  Component Value Date   NA 137 03/31/2024   K 4.0 03/31/2024   CL 105 03/31/2024   CO2 22 03/31/2024   BUN 15 03/31/2024   CREATININE 0.57 03/31/2024   GLUCOSE 98 03/31/2024    Discharge Medications:   Allergies as of 03/31/2024       Reactions   Codeine Nausea And Vomiting, Diarrhea   Meperidine     Other reaction(s): Delusions (intolerance)        Medication List     STOP taking these medications    naproxen sodium 220 MG tablet Commonly known as: ALEVE       TAKE these medications    acetaminophen  500 MG tablet Commonly known as: TYLENOL  Take 1,000 mg by mouth daily as needed (pain.).   amoxicillin  500 MG tablet Commonly known as: AMOXIL  Take 2,000 mg by mouth See admin instructions. Take 4 tablets (2000 mg) by mouth 1 hour prior to dental appointments.  aspirin  EC 81 MG tablet Take 1 tablet (81 mg total) by mouth 2 (two) times daily for 28 days. Swallow whole. Start taking on: April 01, 2024   atorvastatin  10 MG tablet Commonly known as: Lipitor Take one tab po daily M-Friday and skip Saturday and Sundays What changed:  how much to take how to take this when to take this additional instructions   celecoxib  100 MG capsule Commonly known as: CeleBREX  Take 1 capsule (100 mg total) by mouth 2 (two) times daily for 14 days.   DropSafe Alcohol  Prep 70 % Pads USE AS DIRECTED WHEN CHECKING BLOOD SUGARS   gabapentin  300 MG capsule Commonly known as: NEURONTIN  Take 300 mg by mouth at bedtime.   latanoprost  0.005 % ophthalmic solution Commonly known as: XALATAN  Place 1 drop into both eyes at bedtime.   levocetirizine 5 MG  tablet Commonly known as: XYZAL  Take 5 mg by mouth daily as needed for allergies.   metFORMIN  750 MG 24 hr tablet Commonly known as: GLUCOPHAGE -XR TAKE 1 TABLET EVERY DAY WITH EVENING MEAL What changed: See the new instructions.   methocarbamol  500 MG tablet Commonly known as: ROBAXIN  Take 1 tablet (500 mg total) by mouth every 8 (eight) hours as needed for up to 10 days for muscle spasms.   montelukast  10 MG tablet Commonly known as: SINGULAIR  TAKE 1 TABLET(10 MG) BY MOUTH DAILY What changed: See the new instructions.   multivitamin with minerals Tabs tablet Take 1 tablet by mouth daily in the afternoon.   NASACORT  AQ NA Place 1-2 sprays into both nostrils daily as needed (nasal congestion/allergies.).   Olmesartan -amLODIPine -HCTZ 40-5-12.5 MG Tabs TAKE 1 TABLET EVERY DAY What changed: when to take this   omeprazole  40 MG capsule Commonly known as: PRILOSEC Take 1 capsule (40 mg total) by mouth daily for 21 days.   ondansetron  4 MG tablet Commonly known as: Zofran  Take 1 tablet (4 mg total) by mouth every 8 (eight) hours as needed for up to 14 days for nausea or vomiting.   oxyCODONE  5 MG immediate release tablet Commonly known as: Roxicodone  Take 1 tablet (5 mg total) by mouth every 4 (four) hours as needed for up to 7 days for severe pain (pain score 7-10) or moderate pain (pain score 4-6).   Ozempic  (2 MG/DOSE) 8 MG/3ML Sopn Generic drug: Semaglutide  (2 MG/DOSE) Inject 2 mg into the skin once a week. What changed: when to take this   True Metrix Air Glucose Meter w/Device Kit Inject 1 kit into the skin daily. Use as directed to check blood sugars 1 time per dx: e11.22   True Metrix Blood Glucose Test test strip Generic drug: glucose blood USE AS INSTRUCTED   True Metrix Level 1 Low Soln USE AS DIRECTED WITH GLUCOSE METER   TRUEplus Lancets 33G Misc USE AS DIRECTED   Vitamin D  50 MCG (2000 UT) tablet Take 2,000 Units by mouth every evening.         Diagnostic Studies: DG Knee Left Port Result Date: 03/30/2024 CLINICAL DATA:  [id number] Post-operative state 252351. EXAM: PORTABLE LEFT KNEE - 1-2 VIEW COMPARISON:  08/05/2023. FINDINGS: No acute fracture or dislocation. No aggressive osseous lesion. Redemonstration of left total knee arthroplasty with patellar resurfacing. The hardware is intact. No periprosthetic fracture or lucency. No interval change in alignment, when compared to the available recent prior examination. No knee effusion or focal soft tissue swelling. No radiopaque foreign bodies. IMPRESSION: *No acute osseous abnormality of the left knee. Electronically Signed  By: Ree Molt M.D.   On: 03/30/2024 09:56    Disposition: Discharge disposition: 01-Home or Self Care       Discharge Instructions     Call MD / Call 911   Complete by: As directed    If you experience chest pain or shortness of breath, CALL 911 and be transported to the hospital emergency room.  If you develope a fever above 101 F, pus (white drainage) or increased drainage or redness at the wound, or calf pain, call your surgeon's office.   Constipation Prevention   Complete by: As directed    Drink plenty of fluids.  Prune juice may be helpful.  You may use a stool softener, such as Colace (over the counter) 100 mg twice a day.  Use MiraLax  (over the counter) for constipation as needed.   Diet - low sodium heart healthy   Complete by: As directed    Increase activity slowly as tolerated   Complete by: As directed    Post-operative opioid taper instructions:   Complete by: As directed    POST-OPERATIVE OPIOID TAPER INSTRUCTIONS: It is important to wean off of your opioid medication as soon as possible. If you do not need pain medication after your surgery it is ok to stop day one. Opioids include: Codeine, Hydrocodone(Norco, Vicodin), Oxycodone (Percocet, oxycontin ) and hydromorphone  amongst others.  Long term and even short term use of opiods can  cause: Increased pain response Dependence Constipation Depression Respiratory depression And more.  Withdrawal symptoms can include Flu like symptoms Nausea, vomiting And more Techniques to manage these symptoms Hydrate well Eat regular healthy meals Stay active Use relaxation techniques(deep breathing, meditating, yoga) Do Not substitute Alcohol  to help with tapering If you have been on opioids for less than two weeks and do not have pain than it is ok to stop all together.  Plan to wean off of opioids This plan should start within one week post op of your joint replacement. Maintain the same interval or time between taking each dose and first decrease the dose.  Cut the total daily intake of opioids by one tablet each day Next start to increase the time between doses. The last dose that should be eliminated is the evening dose.           Follow-up Information     Edna Toribio LABOR, MD Follow up in 1 week(s).   Specialty: Orthopedic Surgery Contact information: [address] [place] [address] [phone]                    Discharge Instructions      INSTRUCTIONS AFTER JOINT REPLACEMENT   Remove items at home which could result in a fall. This includes throw rugs or furniture in walking pathways ICE to the affected joint every three hours while awake for 30 minutes at a time, for at least the first 3-5 days, and then as needed for pain and swelling.  Continue to use ice for pain and swelling. You may notice swelling that will progress down to the foot and ankle.  This is normal after surgery.  Elevate your leg when you are not up walking on it.   Continue to use the breathing machine you got in the hospital (incentive spirometer) which will help keep your temperature down.  It is common for your temperature to cycle up and down following surgery, especially at night when you are not up moving around and exerting yourself.  The breathing  machine keeps your lungs expanded and your temperature down.   DIET:  As you were doing prior to hospitalization, we recommend a well-balanced diet.  DRESSING / WOUND CARE / SHOWERING  Keep the surgical dressing until follow up.  The dressing is water  proof, so you can shower without any extra covering.  IF THE DRESSING FALLS OFF or the wound gets wet inside, change the dressing with sterile gauze.  Please use good hand washing techniques before changing the dressing.  Do not use any lotions or creams on the incision until instructed by your surgeon.    ACTIVITY  Increase activity slowly as tolerated, but follow the weight bearing instructions below.   No driving for 6 weeks or until further direction given by your physician.  You cannot drive while taking narcotics.  No lifting or carrying greater than 10 lbs. until further directed by your surgeon. Avoid periods of inactivity such as sitting longer than an hour when not asleep. This helps prevent blood clots.  You may return to work once you are authorized by your doctor.     WEIGHT BEARING   Weight bearing as tolerated with assist device (walker, cane, etc) as directed, use it as long as suggested by your surgeon or therapist, typically at least 4-6 weeks.   EXERCISES  Results after joint replacement surgery are often greatly improved when you follow the exercise, range of motion and muscle strengthening exercises prescribed by your doctor. Safety measures are also important to protect the joint from further injury. Any time any of these exercises cause you to have increased pain or swelling, decrease what you are doing until you are comfortable again and then slowly increase them. If you have problems or questions, call your caregiver or physical therapist for advice.   Rehabilitation is important following a joint replacement. After just a few days of immobilization, the muscles of the leg can become weakened and shrink (atrophy).   These exercises are designed to build up the tone and strength of the thigh and leg muscles and to improve motion. Often times heat used for twenty to thirty minutes before working out will loosen up your tissues and help with improving the range of motion but do not use heat for the first two weeks following surgery (sometimes heat can increase post-operative swelling).   These exercises can be done on a training (exercise) mat, on the floor, on a table or on a bed. Use whatever works the best and is most comfortable for you.    Use music or television while you are exercising so that the exercises are a pleasant break in your day. This will make your life better with the exercises acting as a break in your routine that you can look forward to.   Perform all exercises about fifteen times, three times per day or as directed.  You should exercise both the operative leg and the other leg as well.  Exercises include:   Quad Sets - Tighten up the muscle on the front of the thigh (Quad) and hold for 5-10 seconds.   Straight Leg Raises - With your knee straight (if you were given a brace, keep it on), lift the leg to 60 degrees, hold for 3 seconds, and slowly lower the leg.  Perform this exercise against resistance later as your leg gets stronger.  Leg Slides: Lying on your back, slowly slide your foot toward your buttocks, bending your knee up off the floor (only go  as far as is comfortable). Then slowly slide your foot back down until your leg is flat on the floor again.  Angel Wings: Lying on your back spread your legs to the side as far apart as you can without causing discomfort.  Hamstring Strength:  Lying on your back, push your heel against the floor with your leg straight by tightening up the muscles of your buttocks.  Repeat, but this time bend your knee to a comfortable angle, and push your heel against the floor.  You may put a pillow under the heel to make it more comfortable if necessary.   A  rehabilitation program following joint replacement surgery can speed recovery and prevent re-injury in the future due to weakened muscles. Contact your doctor or a physical therapist for more information on knee rehabilitation.    CONSTIPATION  Constipation is defined medically as fewer than three stools per week and severe constipation as less than one stool per week.  Even if you have a regular bowel pattern at home, your normal regimen is likely to be disrupted due to multiple reasons following surgery.  Combination of anesthesia, postoperative narcotics, change in appetite and fluid intake all can affect your bowels.   YOU MUST use at least one of the following options; they are listed in order of increasing strength to get the job done.  They are all available over the counter, and you may need to use some, POSSIBLY even all of these options:    Drink plenty of fluids (prune juice may be helpful) and high fiber foods Colace 100 mg by mouth twice a day  Senokot for constipation as directed and as needed Dulcolax (bisacodyl), take with full glass of water   Miralax  (polyethylene glycol) once or twice a day as needed.  If you have tried all these things and are unable to have a bowel movement in the first 3-4 days after surgery call either your surgeon or your primary doctor.    If you experience loose stools or diarrhea, hold the medications until you stool forms back up.  If your symptoms do not get better within 1 week or if they get worse, check with your doctor.  If you experience the worst abdominal pain ever or develop nausea or vomiting, please contact the office immediately for further recommendations for treatment.   ITCHING:  If you experience itching with your medications, try taking only a single pain pill, or even half a pain pill at a time.  You can also use Benadryl  over the counter for itching or also to help with sleep.   TED HOSE STOCKINGS:  Use stockings on both legs until  for at least 2 weeks or as directed by physician office. They may be removed at night for sleeping.  MEDICATIONS:  See your medication summary on the "After Visit Summary" that nursing will review with you.  You may have some home medications which will be placed on hold until you complete the course of blood thinner medication.  It is important for you to complete the blood thinner medication as prescribed.   Blood clot prevention (DVT Prophylaxis): After surgery you are at an increased risk for a blood clot. you were prescribed a blood thinner, Aspirin  81mg , to be taken twice daily for a total of 4 weeks from surgery to help reduce your risk of getting a blood clot. This will help prevent a blood clot. Signs of a pulmonary embolus (blood clot in the lungs) include sudden short  of breath, feeling lightheaded or dizzy, chest pain with a deep breath, rapid pulse rapid breathing. Signs of a blood clot in your arms or legs include new unexplained swelling and cramping, warm, red or darkened skin around the painful area. Please call the office or 911 right away if these signs or symptoms develop.  PRECAUTIONS:  If you experience chest pain or shortness of breath - call 911 immediately for transfer to the hospital emergency department.   If you develop a fever greater that 101 F, purulent drainage from wound, increased redness or drainage from wound, foul odor from the wound/dressing, or calf pain - CONTACT YOUR SURGEON.                                                   FOLLOW-UP APPOINTMENTS:  If you do not already have a post-op appointment, please call the office for an appointment to be seen by your surgeon.  Guidelines for how soon to be seen are listed in your "After Visit Summary", but are typically between 2-3 weeks after surgery.  POST-OPERATIVE OPIOID TAPER INSTRUCTIONS: It is important to wean off of your opioid medication as soon as possible. If you do not need pain medication after your surgery  it is ok to stop day one. Opioids include: Codeine, Hydrocodone(Norco, Vicodin), Oxycodone (Percocet, oxycontin ) and hydromorphone  amongst others.  Long term and even short term use of opiods can cause: Increased pain response Dependence Constipation Depression Respiratory depression And more.  Withdrawal symptoms can include Flu like symptoms Nausea, vomiting And more Techniques to manage these symptoms Hydrate well Eat regular healthy meals Stay active Use relaxation techniques(deep breathing, meditating, yoga) Do Not substitute Alcohol  to help with tapering If you have been on opioids for less than two weeks and do not have pain than it is ok to stop all together.  Plan to wean off of opioids This plan should start within one week post op of your joint replacement. Maintain the same interval or time between taking each dose and first decrease the dose.  Cut the total daily intake of opioids by one tablet each day Next start to increase the time between doses. The last dose that should be eliminated is the evening dose.   MAKE SURE YOU:  Understand these instructions.  Get help right away if you are not doing well or get worse.    Thank you for letting us  be a part of your medical care team.  It is a privilege we respect greatly.  We hope these instructions will help you stay on track for a fast and full recovery!      Signed: Amour Cutrone A Anniah Glick 03/31/2024, 7:25 AM

## 2024-03-31 NOTE — Progress Notes (Signed)
     Subjective: Patient reports pain as mild.  She did very well with physical therapy yesterday.  She notes that the knee now since the revision feels more stable than before surgery.  She is pleased with her progress.  No concerns today.  Hopeful for discharge home today.  Yesterday's total administered Morphine Milligram Equivalents: 77.5   Objective:   VITALS:   Vitals:   03/30/24 1313 03/30/24 2252 03/31/24 0129 03/31/24 0627  BP: 124/67 (!) 145/81 (!) 143/84 (!) 141/81  Pulse: 83 (!) 57 (!) 59 62  Resp: 15 18 18 18   Temp: 97.9 F (36.6 C) 97.6 F (36.4 C) (!) 97.4 F (36.3 C) 97.6 F (36.4 C)  TempSrc: Oral Oral Oral Oral  SpO2: 96% 99% 99% 98%  Weight:      Height:        Sensation intact distally Intact pulses distally Dorsiflexion/Plantar flexion intact Incision: dressing C/D/I Compartment soft    Lab Results  Component Value Date   WBC 9.3 03/31/2024   HGB 12.9 03/31/2024   HCT 44.2 03/31/2024   MCV 85.5 03/31/2024   PLT 332 03/31/2024   BMET    Component Value Date/Time   NA 137 03/31/2024 0321   NA 141 12/16/2023 1519   K 4.0 03/31/2024 0321   CL 105 03/31/2024 0321   CO2 22 03/31/2024 0321   GLUCOSE 98 03/31/2024 0321   BUN 15 03/31/2024 0321   BUN 18 12/16/2023 1519   CREATININE 0.57 03/31/2024 0321   CALCIUM  9.4 03/31/2024 0321   EGFR 92 12/16/2023 1519   GFRNONAA >60 03/31/2024 0321    Xray: Total knee arthroplasty components in good position no adverse features  Assessment/Plan: 1 Day Post-Op   Principal Problem:   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 Edna at [facility].  Address: [address], [place], [place] [address]  Office Phone: [phone]    TORIBIO DELENA EDNA 03/31/2024, 7:19 AM   TORIBIO Edna, MD  Contact information:   [phone] 7am-5pm epic message Dr. Edna, or call office for patient follow up: [phone] After hours and holidays please check [email or web address] for group call information for [facility]

## 2024-03-31 NOTE — Care Management Important Message (Signed)
 Important Message  Patient Details IM Letter given to the Patient. Name: [name] MRN: [id number] Date of Birth: 1950/08/07   Important Message Given:  Yes - Medicare IM     [name] 03/31/2024, 10:00 AM

## 2024-03-31 NOTE — Progress Notes (Signed)
 Physical Therapy Treatment Patient Details Name: [name] MRN: [id number] DOB: 06-05-1951 Today's Date: 03/31/2024   History of Present Illness 73 y.o. female admitted 03/30/24 with L knee instability. s/p  Revision left total knee arthroplasty liner exchange, synovectomy, scar excision, application of wound VAC.  PMH: L TKA 08/05/23, DM 2, HLD, chronic pain, HTN, GERD, OSA, CVA, B foot surgery, aortic atherosclerosis.    PT Comments   Patient practiced steps and ambulated x 125'. Patient has met PT goals for DC.    If plan is discharge home, recommend the following: A little help with walking and/or transfers;A little help with bathing/dressing/bathroom;Assistance with cooking/housework;Help with stairs or ramp for entrance   Can travel by private vehicle        Equipment Recommendations  None recommended by PT    Recommendations for Other Services       Precautions / Restrictions Precautions Precautions: Knee;Fall Precaution/Restrictions Comments: reviewed no pillow under knee; wound VAC L knee Restrictions Other Position/Activity Restrictions: WBAT     Mobility  Bed Mobility     General bed mobility comments: in BR    Transfers Overall transfer level: Needs assistance Equipment used: Rolling walker (2 wheels) Transfers: Sit to/from Stand Sit to Stand: Supervision           General transfer comment: use  of rail from low toilet    Ambulation/Gait Ambulation/Gait assistance: Contact guard assist Gait Distance (Feet): 125 Feet Assistive device: Rolling walker (2 wheels) Gait Pattern/deviations: Step-to pattern, Decreased step length - right, Decreased step length - left       General Gait Details: VCs sequencing initially, no loss of balance   Stairs Stairs: Yes Stairs assistance: Contact guard assist Stair Management: One rail Right, Step to pattern, With cane Number of Stairs: 2 General stair comments: simulated  with no rail, wall  instead   Wheelchair Mobility     Tilt Bed    Modified Rankin (Stroke Patients Only)       Balance Overall balance assessment: Modified Independent                                          Communication Communication Communication: No apparent difficulties  Cognition Arousal: Alert Behavior During Therapy: WFL for tasks assessed/performed   PT - Cognitive impairments: No apparent impairments                         Following commands: Intact      Cueing    Exercises    General Comments        Pertinent Vitals/Pain Pain Assessment Pain Score: 4  Pain Location: L knee Pain Descriptors / Indicators: Sore Pain Intervention(s): Monitored during session, Premedicated before session, Ice applied    Home Living                          Prior Function            PT Goals (current goals can now be found in the care plan section) Progress towards PT goals: Progressing toward goals    Frequency    7X/week      PT Plan      Co-evaluation              AM-PAC PT 6 Clicks Mobility   Outcome Measure  Help  needed turning from your back to your side while in a flat bed without using bedrails?: A Little Help needed moving from lying on your back to sitting on the side of a flat bed without using bedrails?: A Little Help needed moving to and from a bed to a chair (including a wheelchair)?: A Little Help needed standing up from a chair using your arms (e.g., wheelchair or bedside chair)?: A Little Help needed to walk in hospital room?: A Little Help needed climbing 3-5 steps with a railing? : A Little 6 Click Score: 18    End of Session Equipment Utilized During Treatment: Gait belt Activity Tolerance: Patient tolerated treatment well Patient left: in chair;with call bell/phone within reach;with chair alarm set Nurse Communication: Mobility status PT Visit Diagnosis: Difficulty in walking, not elsewhere  classified (R26.2);Pain Pain - Right/Left: Left Pain - part of body: Knee     Time: 9074-9058 PT Time Calculation (min) (ACUTE ONLY): 16 min  Charges:    $Gait Training: 8-22 mins PT General Charges $$ ACUTE PT VISIT: 1 Visit                     [name] PT Acute Rehabilitation Services Office [phone]   [name] 03/31/2024, 12:12 PM

## 2024-03-31 NOTE — Progress Notes (Signed)
 Physical Therapy Treatment Patient Details Name: [name] MRN: [id number] DOB: [birth year] Today's Date: 03/31/2024   History of Present Illness 73 y.o. female admitted 03/30/24 with L knee instability. s/p  Revision left total knee arthroplasty liner exchange, synovectomy, scar excision, application of wound VAC.  PMH: L TKA 08/05/23, DM 2, HLD, chronic pain, HTN, GERD, OSA, CVA, B foot surgery, aortic atherosclerosis.    PT Comments  Patient reports pain is mild, patient tolerated left knee exercises well, ambulated in room to BR. Will practice steps  next visit then she should be ready for DC    If plan is discharge home, recommend the following: A little help with walking and/or transfers;A little help with bathing/dressing/bathroom;Assistance with cooking/housework;Help with stairs or ramp for entrance   Can travel by private vehicle        Equipment Recommendations  None recommended by PT    Recommendations for Other Services       Precautions / Restrictions Precautions Precautions: Knee;Fall Precaution/Restrictions Comments: reviewed no pillow under knee; wound VAC L knee Restrictions Other Position/Activity Restrictions: WBAT     Mobility  Bed Mobility Overal bed mobility: Needs Assistance Bed Mobility: Supine to Sit     Supine to sit: Supervision     General bed mobility comments: HOB up, used rail    Transfers   Equipment used: Rolling walker (2 wheels)   Sit to Stand: Supervision           General transfer comment: VCs hand placement, slight dectreased  descent  to low toilet, use of rail    Ambulation/Gait Ambulation/Gait assistance: Contact guard assist Gait Distance (Feet): 20 Feet Assistive device: Rolling walker (2 wheels) Gait Pattern/deviations: Step-to pattern, Decreased step length - right, Decreased step length - left       General Gait Details: VCs sequencing initially, no loss of balance   Stairs             Wheelchair  Mobility     Tilt Bed    Modified Rankin (Stroke Patients Only)       Balance Overall balance assessment: Modified Independent                                          Communication Communication Communication: No apparent difficulties  Cognition Arousal: Alert Behavior During Therapy: WFL for tasks assessed/performed   PT - Cognitive impairments: No apparent impairments                         Following commands: Intact      Cueing    Exercises Total Joint Exercises Ankle Circles/Pumps: AROM, Both, 10 reps Quad Sets: AROM, Both, 10 reps Heel Slides: AAROM, Left, 10 reps Hip ABduction/ADduction: AAROM, Left, 10 reps Straight Leg Raises: AAROM, Left, 10 reps Long Arc Quad: AROM, Left, 10 reps Goniometric ROM: 10- 60  left knee flex    General Comments        Pertinent Vitals/Pain Pain Assessment Pain Score: 4  Pain Location: L knee Pain Descriptors / Indicators: Sore Pain Intervention(s): Monitored during session, Premedicated before session, Ice applied    Home Living                          Prior Function            PT  Goals (current goals can now be found in the care plan section) Progress towards PT goals: Progressing toward goals    Frequency    7X/week      PT Plan      Co-evaluation              AM-PAC PT 6 Clicks Mobility   Outcome Measure  Help needed turning from your back to your side while in a flat bed without using bedrails?: A Little Help needed moving from lying on your back to sitting on the side of a flat bed without using bedrails?: A Little Help needed moving to and from a bed to a chair (including a wheelchair)?: A Little Help needed standing up from a chair using your arms (e.g., wheelchair or bedside chair)?: A Little Help needed to walk in hospital room?: A Little Help needed climbing 3-5 steps with a railing? : A Little 6 Click Score: 18    End of Session Equipment  Utilized During Treatment: Gait belt Activity Tolerance: Patient tolerated treatment well Patient left:  (in BR) Nurse Communication: Mobility status Pain - Right/Left: Left Pain - part of body: Knee     Time: 0900-0920 PT Time Calculation (min) (ACUTE ONLY): 20 min  Charges:      PT General Charges $$ ACUTE PT VISIT: 1 Visit                     Darice Potters PT Acute Rehabilitation Services Office [phone]  Potters Darice Norris 03/31/2024, 12:06 PM

## 2024-03-31 NOTE — Telephone Encounter (Signed)
 Completed refill/ order form for Ozempic  ([employer]) and faxed to provider's office for review and signature.

## 2024-03-31 NOTE — Plan of Care (Signed)
  Problem: Health Behavior/Discharge Planning: Goal: Ability to manage health-related needs will improve Outcome: Progressing   Problem: Clinical Measurements: Goal: Ability to maintain clinical measurements within normal limits will improve Outcome: Progressing Goal: Will remain free from infection Outcome: Progressing Goal: Diagnostic test results will improve Outcome: Progressing Goal: Respiratory complications will improve Outcome: Progressing Goal: Cardiovascular complication will be avoided Outcome: Progressing   Problem: Activity: Goal: Risk for activity intolerance will decrease Outcome: Progressing   Problem: Coping: Goal: Level of anxiety will decrease Outcome: Progressing   Problem: Elimination: Goal: Will not experience complications related to bowel motility Outcome: Progressing Goal: Will not experience complications related to urinary retention Outcome: Progressing   Problem: Pain Managment: Goal: General experience of comfort will improve and/or be controlled Outcome: Progressing   Problem: Safety: Goal: Ability to remain free from injury will improve Outcome: Progressing   Problem: Skin Integrity: Goal: Risk for impaired skin integrity will decrease Outcome: Progressing   Problem: Education: Goal: Knowledge of the prescribed therapeutic regimen will improve Outcome: Progressing Goal: Individualized Educational Video(s) Outcome: Progressing   Problem: Activity: Goal: Ability to avoid complications of mobility impairment will improve Outcome: Progressing Goal: Range of joint motion will improve Outcome: Progressing   Problem: Clinical Measurements: Goal: Postoperative complications will be avoided or minimized Outcome: Progressing   Problem: Pain Management: Goal: Pain level will decrease with appropriate interventions Outcome: Progressing   Problem: Skin Integrity: Goal: Will show signs of wound healing Outcome: Progressing   Problem:  Skin Integrity: Goal: Risk for impaired skin integrity will decrease Outcome: Progressing   Problem: Tissue Perfusion: Goal: Adequacy of tissue perfusion will improve Outcome: Progressing

## 2024-04-01 ENCOUNTER — Encounter (HOSPITAL_COMMUNITY): Payer: Self-pay | Admitting: Orthopedic Surgery

## 2024-04-01 ENCOUNTER — Telehealth: Payer: Self-pay | Admitting: *Deleted

## 2024-04-01 DIAGNOSIS — M25562 Pain in left knee: Secondary | ICD-10-CM | POA: Diagnosis not present

## 2024-04-01 MED ORDER — ROPIVACAINE HCL 5 MG/ML IJ SOLN
INTRAMUSCULAR | Status: DC | PRN
  Administered 2024-03-30: 30 mL via PERINEURAL

## 2024-04-01 MED ORDER — CLONIDINE HCL (ANALGESIA) 100 MCG/ML EP SOLN
EPIDURAL | Status: DC | PRN
Start: 2024-04-01 — End: 2024-04-01
  Administered 2024-03-30: 100 ug

## 2024-04-01 NOTE — Anesthesia Procedure Notes (Signed)
 Anesthesia Regional Block: Adductor canal block   Pre-Anesthetic Checklist: , timeout performed,  Correct Patient, Correct Site, Correct Laterality,  Correct Procedure, Correct Position, site marked,  Risks and benefits discussed,  Surgical consent,  Pre-op evaluation,  At surgeon's request and post-op pain management  Laterality: Lower and Left  Prep: chloraprep       Needles:  Injection technique: Single-shot  Needle Type: Echogenic Needle     Needle Length: 9cm  Needle Gauge: 22     Additional Needles:   Procedures:,,,, ultrasound used (permanent image in chart),,    Narrative:  Start time: 04/01/2024 6:59 AM End time: 03/30/2024 7:05 AM Injection made incrementally with aspirations every 5 mL.  Performed by: Personally  Anesthesiologist: Jefm Garnette LABOR, MD  Additional Notes: Block assessed prior to surgery. Pt tolerated procedure well.

## 2024-04-01 NOTE — Addendum Note (Signed)
 Addendum  created 04/01/24 1439 by Jefm Garnette LABOR, MD   Child order released for a procedure order, Clinical Note Signed, Intraprocedure Blocks edited, Intraprocedure Meds edited, SmartForm saved

## 2024-04-01 NOTE — Transitions of Care (Post Inpatient/ED Visit) (Signed)
   04/01/2024  Name: [name] MRN: [id number] DOB: 1951/06/26  Today's TOC FU Call Status: Today's TOC FU Call Status:: Unsuccessful Call (1st Attempt) Unsuccessful Call (1st Attempt) Date: 04/01/24  Attempted to reach the patient regarding the most recent Inpatient/ED visit.  A detailed HIPAA compliant message was left, including RNCM contact information, requesting a return call.  Follow Up Plan: Additional outreach attempts will be made to reach the patient to complete the Transitions of Care (Post Inpatient/ED visit) call.   Andrea Dimes RN, BSN [place]  [facility] RN Care Manager [phone]

## 2024-04-02 ENCOUNTER — Telehealth: Payer: Self-pay | Admitting: *Deleted

## 2024-04-02 NOTE — Telephone Encounter (Signed)
 Faxed completed reorder form for Ozempic  to [employer]

## 2024-04-02 NOTE — Transitions of Care (Post Inpatient/ED Visit) (Signed)
 04/02/2024  Name: [name] MRN: [id number] DOB: Apr 26, 1951  Today's TOC FU Call Status: Today's TOC FU Call Status:: Successful TOC FU Call Completed TOC FU Call Complete Date: 04/02/24 Patient's Name and Date of Birth confirmed.  Transition Care Management Follow-up Telephone Call Date of Discharge: 03/31/24 Discharge Facility: [facility]) Type of Discharge: Inpatient Admission Primary Inpatient Discharge Diagnosis:: Instability of internal left knee prosthesis How have you been since you were released from the hospital?: Better Any questions or concerns?: No  Items Reviewed: Did you receive and understand the discharge instructions provided?: Yes Medications obtained,verified, and reconciled?: Yes (Medications Reviewed) Any new allergies since your discharge?: No Dietary orders reviewed?: Yes Type of Diet Ordered:: low sodium heart healthy Do you have support at home?: Yes People in Home [RPT]: alone Name of Support/Comfort Primary Source: Dtr is supportive  Medications Reviewed Today: Medications Reviewed Today     Reviewed by Lucky Andrea LABOR, RN (Registered Nurse) on 04/02/24 at 1226  Med List Status: <None>   Medication Order Taking? Sig Documenting Provider Last Dose Status Informant  acetaminophen  (TYLENOL ) 500 MG tablet [id number] Yes Take 1,000 mg by mouth daily as needed (pain.). [provider]  Active Self  Alcohol  Swabs (DROPSAFE ALCOHOL  PREP) 70 % PADS [id number]  USE AS DIRECTED WHEN CHECKING BLOOD SUGARS Jarold Medici, MD  Active Self  amoxicillin  (AMOXIL ) 500 MG tablet [id number] Yes Take 2,000 mg by mouth See admin instructions. Take 4 tablets (2000 mg) by mouth 1 hour prior to dental appointments.  Patient taking differently: Take 2,000 mg by mouth as needed (dental visits). Take 4 tablets (2000 mg) by mouth 1 hour prior to dental appointments.   [provider]  Active Self  aspirin  EC 81 MG tablet [id number] Yes Take 1 tablet (81 mg  total) by mouth 2 (two) times daily for 28 days. Swallow whole. Edna Toribio LABOR, MD  Active   atorvastatin  (LIPITOR) 10 MG tablet [id number] Yes Take one tab po daily M-Friday and skip Saturday and Sundays Jarold Medici, MD  Active Self  Blood Glucose Calibration (TRUE METRIX LEVEL 1) Low SOLN [id number] Yes USE AS DIRECTED WITH GLUCOSE MINDA Jarold Medici, MD  Active Self  Blood Glucose Monitoring Suppl (TRUE METRIX AIR GLUCOSE METER) w/Device KIT [id number] Yes Inject 1 kit into the skin daily. Use as directed to check blood sugars 1 time per dx: e11.22 Jarold Medici, MD  Active Self  celecoxib  (CELEBREX ) 100 MG capsule [id number]  Take 1 capsule (100 mg total) by mouth 2 (two) times daily for 14 days.  Patient not taking: Reported on 04/02/2024   Edna Toribio LABOR, MD  Active   Cholecalciferol  (VITAMIN D ) 2000 UNITS tablet [id number] Yes Take 2,000 Units by mouth every evening. [provider]  Active Self  gabapentin  (NEURONTIN ) 300 MG capsule [id number] Yes Take 300 mg by mouth at bedtime. [provider]  Active Self  glucose blood (TRUE METRIX BLOOD GLUCOSE TEST) test strip [id number] Yes USE AS INSTRUCTED Jarold Medici, MD  Active Self  latanoprost  (XALATAN ) 0.005 % ophthalmic solution [id number] Yes Place 1 drop into both eyes at bedtime. [provider]  Active Self  levocetirizine (XYZAL ) 5 MG tablet [id number] Yes Take 5 mg by mouth daily as needed for allergies. [provider]  Active Self  metFORMIN  (GLUCOPHAGE -XR) 750 MG 24 hr tablet [id number] Yes TAKE 1 TABLET EVERY DAY WITH EVENING MEAL Jarold Medici, MD  Active Self  methocarbamol  (ROBAXIN ) 500 MG tablet [id number] Yes  Take 1 tablet (500 mg total) by mouth every 8 (eight) hours as needed for up to 10 days for muscle spasms. Edna Toribio LABOR, MD  Active   montelukast  (SINGULAIR ) 10 MG tablet [id number] Yes TAKE 1 TABLET(10 MG) BY MOUTH DAILY  Patient taking differently: Take 10 mg by mouth daily  as needed (allergies.).   Petrina Pries, NP  Active Self  Multiple Vitamin (MULTIVITAMIN WITH MINERALS) TABS tablet [id number] Yes Take 1 tablet by mouth daily in the afternoon. [provider]  Active Self  Olmesartan -amLODIPine -HCTZ 40-5-12.5 MG TABS [id number] Yes TAKE 1 TABLET EVERY DAY Jarold Medici, MD  Active Self  omeprazole  (PRILOSEC) 40 MG capsule [id number]  Take 1 capsule (40 mg total) by mouth daily for 21 days.  Patient not taking: Reported on 04/02/2024   Renae Bernarda HERO, PA-C  Expired 03/04/24 2359   ondansetron  (ZOFRAN ) 4 MG tablet [id number]  Take 1 tablet (4 mg total) by mouth every 8 (eight) hours as needed for up to 14 days for nausea or vomiting.  Patient not taking: Reported on 04/02/2024   Edna Toribio LABOR, MD  Active   oxyCODONE  (ROXICODONE ) 5 MG immediate release tablet [id number] Yes Take 1 tablet (5 mg total) by mouth every 4 (four) hours as needed for up to 7 days for severe pain (pain score 7-10) or moderate pain (pain score 4-6). Edna Toribio LABOR, MD  Active   Semaglutide , 2 MG/DOSE, (OZEMPIC , 2 MG/DOSE,) 8 MG/3ML SOPN [id number]  Inject 2 mg into the skin once a week.  Patient not taking: Reported on 04/02/2024   Jarold Medici, MD  Active Self  Triamcinolone  Acetonide (NASACORT  AQ NA) [id number] Yes Place 1-2 sprays into both nostrils daily as needed (nasal congestion/allergies.). [provider]  Active Self  TRUEplus Lancets 33G MISC [id number] Yes USE AS DIRECTED Jarold Medici, MD  Active Self            Home Care and Equipment/Supplies: Were Home Health Services Ordered?: No Any new equipment or medical supplies ordered?: No  Functional Questionnaire: Do you need assistance with bathing/showering or dressing?: No Do you need assistance with meal preparation?: Yes (Dtr prepares meals) Do you need assistance with eating?: No Do you have difficulty maintaining continence: No Do you need assistance with getting out of bed/getting  out of a chair/moving?: No Do you have difficulty managing or taking your medications?: No  Follow up appointments reviewed: PCP Follow-up appointment confirmed?: No (Patient is scheduled with PCP on 05/04/24 for physical exam) MD Provider Line Number:[phone] Given: No Specialist Hospital Follow-up appointment confirmed?: Yes Date of Specialist follow-up appointment?: 04/07/24 Follow-Up Specialty Provider:: Dr. Edna Do you need transportation to your follow-up appointment?: No Do you understand care options if your condition(s) worsen?: Yes-patient verbalized understanding  SDOH Interventions Today    Flowsheet Row Most Recent Value  SDOH Interventions   Food Insecurity Interventions Intervention Not Indicated  Housing Interventions Intervention Not Indicated  Transportation Interventions Intervention Not Indicated  Utilities Interventions Intervention Not Indicated    Andrea Dimes RN, BSN [place]  [facility] RN Care Manager [phone]

## 2024-04-03 ENCOUNTER — Telehealth: Payer: Self-pay

## 2024-04-03 DIAGNOSIS — M25562 Pain in left knee: Secondary | ICD-10-CM | POA: Diagnosis not present

## 2024-04-03 NOTE — Telephone Encounter (Signed)
-----   Message from Annalee Joseph sent at 04/02/2024  4:31 PM EDT ----- Regarding: New referral Please schedule a new patient referral for this patient with +/- prosthetic joint infection.  Per Dr Edna, no concerns for infection in OR but cx unexpectedly grew corynebacterium and asking if needs to be on IV antibiotics.  Will be started on PO linezolid 600mg  po bid as a bridge.

## 2024-04-03 NOTE — Telephone Encounter (Signed)
 Patient scheduled 10/1  [name], BSN, RN

## 2024-04-05 LAB — AEROBIC/ANAEROBIC CULTURE W GRAM STAIN (SURGICAL/DEEP WOUND): Gram Stain: NONE SEEN

## 2024-04-08 DIAGNOSIS — M25562 Pain in left knee: Secondary | ICD-10-CM | POA: Diagnosis not present

## 2024-04-10 DIAGNOSIS — M25562 Pain in left knee: Secondary | ICD-10-CM | POA: Diagnosis not present

## 2024-04-14 DIAGNOSIS — T84023D Instability of internal left knee prosthesis, subsequent encounter: Secondary | ICD-10-CM | POA: Diagnosis not present

## 2024-04-15 ENCOUNTER — Ambulatory Visit: Admitting: Internal Medicine

## 2024-04-15 ENCOUNTER — Other Ambulatory Visit: Payer: Self-pay

## 2024-04-15 ENCOUNTER — Telehealth: Payer: Self-pay

## 2024-04-15 ENCOUNTER — Encounter: Payer: Self-pay | Admitting: Internal Medicine

## 2024-04-15 VITALS — BP 138/85 | HR 80 | Temp 97.9°F | Resp 16

## 2024-04-15 DIAGNOSIS — M0530 Rheumatoid heart disease with rheumatoid arthritis of unspecified site: Secondary | ICD-10-CM

## 2024-04-15 DIAGNOSIS — T8450XA Infection and inflammatory reaction due to unspecified internal joint prosthesis, initial encounter: Secondary | ICD-10-CM

## 2024-04-15 DIAGNOSIS — Z79899 Other long term (current) drug therapy: Secondary | ICD-10-CM

## 2024-04-15 DIAGNOSIS — T8450XS Infection and inflammatory reaction due to unspecified internal joint prosthesis, sequela: Secondary | ICD-10-CM

## 2024-04-15 MED ORDER — LINEZOLID 600 MG PO TABS: 600.0000 mg | ORAL_TABLET | Freq: Two times a day (BID) | ORAL | 0 refills | Status: AC

## 2024-04-15 NOTE — Telephone Encounter (Signed)
 New OPAT orders per Dr. Dennise, orders shared with Holley Herring, RN at [facility] and [facility] pharmacy staff.   IR appointment: 04/21/2024 at 8:45 AM - appointment time and location provided to both patient and [facility].   First dose: pending

## 2024-04-15 NOTE — Patient Instructions (Signed)
 Continue linezolid through PICC line placed on 10/7.  Stopped linezolid when daptomycin is started Hold statin while on daptomycin Continue probiotics Follow-up with ID in 6 weeks prior to stopping IV antibiotics

## 2024-04-15 NOTE — Progress Notes (Signed)
 Patient: [name]  DOB: [birth year] MRN: [id number] PCP: Jarold Medici, MD    Patient Active Problem List   Diagnosis Date Noted   Instability of internal left knee prosthesis 03/30/2024   Instability of left knee joint 12/16/2023   Hypertensive heart disease without heart failure 09/17/2023   Lumbar radiculopathy 09/17/2023   Preoperative clearance 06/16/2023   Seasonal allergic rhinitis 05/20/2023   Right ear pain 05/20/2023   Postnasal drip 05/20/2023   Otalgia of right ear 05/20/2023   Encounter for annual health examination 04/17/2023   Aortic atherosclerosis 04/17/2023   Estrogen deficiency 04/17/2023   Dyslipidemia associated with type 2 diabetes mellitus (HCC) 12/07/2021   Pure hypercholesterolemia 12/07/2021   Arthritis of hand 07/01/2021   Pain in right hand 06/29/2021   Pain of left hand 06/29/2021   Carpal tunnel syndrome 06/29/2021   Pain in thoracic spine 10/07/2020   OSA on CPAP 07/30/2018   Left hip pain 07/02/2018   Snoring 02/27/2018   Intermittent sleepiness 02/27/2018   Sleep pattern disturbance 02/27/2018   Obesity (BMI 35.0-39.9 without comorbidity) 02/27/2018   Essential hypertension, benign 02/27/2018   Hyperlipidemia 02/20/2013   Hypertension 02/20/2013     Subjective:  [name] is a 73 y.o. Rheumatoid arthritis on hydroxychloroquine and leflunomide presents for concern for left knee PJI.  Patient had underwent left total knee arthroplasty on August 05, 2023 for prior right osteoarthritis. Struggled after surgery regaining strength and mobility.  Given concern of excessive laxity in her knee in both flexion and extension she was taken to the OR on 03/30/2024 with Dr. Marchione for revision of left total knee arthroplasty, liner exchange, synovectomy scar excision.  No purulence commented per OR note.  Culture was obtained from circumferential synovectomy which grew corynebacterium minutissimum.  ID was called on 9/19 recommended start  linezolid as a bridge to ID appointment. Past medical history of diabetes mellitus hypertension, hypercholesterolemia, CVA Review of Systems  All other systems reviewed and are negative.   Past Medical History:  Diagnosis Date   Allergy 1995   Arthritis 1990   Diabetes mellitus without complication (HCC)    Encounter for annual health examination 04/17/2023   GERD (gastroesophageal reflux disease)    Headache    High cholesterol    Hypertension    Sleep apnea    mild- no CPAP   Stroke (HCC) 07/17/2003   mini- stroke due to HTN    Outpatient Medications Prior to Visit  Medication Sig Dispense Refill   acetaminophen  (TYLENOL ) 500 MG tablet Take 1,000 mg by mouth daily as needed (pain.).     Alcohol  Swabs (DROPSAFE ALCOHOL  PREP) 70 % PADS USE AS DIRECTED WHEN CHECKING BLOOD SUGARS 300 each 3   amoxicillin  (AMOXIL ) 500 MG tablet Take 2,000 mg by mouth See admin instructions. Take 4 tablets (2000 mg) by mouth 1 hour prior to dental appointments. (Patient taking differently: Take 2,000 mg by mouth as needed (dental visits). Take 4 tablets (2000 mg) by mouth 1 hour prior to dental appointments.)     aspirin  EC 81 MG tablet Take 1 tablet (81 mg total) by mouth 2 (two) times daily for 28 days. Swallow whole.     atorvastatin  (LIPITOR) 10 MG tablet Take one tab po daily M-Friday and skip Saturday and Sundays 90 tablet 1   Blood Glucose Calibration (TRUE METRIX LEVEL 1) Low SOLN USE AS DIRECTED WITH GLUCOSE METER 1 each 3   Blood Glucose Monitoring Suppl (TRUE METRIX AIR  GLUCOSE METER) w/Device KIT Inject 1 kit into the skin daily. Use as directed to check blood sugars 1 time per dx: e11.22 1 kit 1   Cholecalciferol  (VITAMIN D ) 2000 UNITS tablet Take 2,000 Units by mouth every evening.     gabapentin  (NEURONTIN ) 300 MG capsule Take 300 mg by mouth at bedtime.     glucose blood (TRUE METRIX BLOOD GLUCOSE TEST) test strip USE AS INSTRUCTED 300 strip 3   latanoprost  (XALATAN ) 0.005 % ophthalmic  solution Place 1 drop into both eyes at bedtime.     levocetirizine (XYZAL ) 5 MG tablet Take 5 mg by mouth daily as needed for allergies.     metFORMIN  (GLUCOPHAGE -XR) 750 MG 24 hr tablet TAKE 1 TABLET EVERY DAY WITH EVENING MEAL 90 tablet 3   montelukast  (SINGULAIR ) 10 MG tablet TAKE 1 TABLET(10 MG) BY MOUTH DAILY (Patient taking differently: Take 10 mg by mouth daily as needed (allergies.).) 30 tablet 2   Multiple Vitamin (MULTIVITAMIN WITH MINERALS) TABS tablet Take 1 tablet by mouth daily in the afternoon.     Olmesartan -amLODIPine -HCTZ 40-5-12.5 MG TABS TAKE 1 TABLET EVERY DAY 90 tablet 3   omeprazole  (PRILOSEC) 40 MG capsule Take 1 capsule (40 mg total) by mouth daily for 21 days. (Patient not taking: Reported on 04/02/2024) 21 capsule 0   Semaglutide , 2 MG/DOSE, (OZEMPIC , 2 MG/DOSE,) 8 MG/3ML SOPN Inject 2 mg into the skin once a week. (Patient not taking: Reported on 04/02/2024) 3 mL 3   Triamcinolone  Acetonide (NASACORT  AQ NA) Place 1-2 sprays into both nostrils daily as needed (nasal congestion/allergies.).     TRUEplus Lancets 33G MISC USE AS DIRECTED 300 each 3   No facility-administered medications prior to visit.     Allergies  Allergen Reactions   Codeine Nausea And Vomiting and Diarrhea   Meperidine      Other reaction(s): Delusions (intolerance)    Social History   Tobacco Use   Smoking status: Never   Smokeless tobacco: Never  Vaping Use   Vaping status: Never Used  Substance Use Topics   Alcohol  use: Yes    Alcohol /week: 1.0 standard drink of alcohol     Types: 1 Shots of liquor per week    Comment: occassionally   Drug use: No    Family History  Problem Relation Age of Onset   Cancer Mother    Hypertension Mother    Cancer Maternal Grandmother    Hypertension Maternal Grandmother    Arthritis Maternal Grandmother    Cancer Sister     Objective:  There were no vitals filed for this visit. There is no height or weight on file to calculate BMI.  Physical  Exam Constitutional:      Appearance: Normal appearance.  HENT:     Head: Normocephalic and atraumatic.     Right Ear: Tympanic membrane normal.     Left Ear: Tympanic membrane normal.     Nose: Nose normal.     Mouth/Throat:     Mouth: Mucous membranes are moist.  Eyes:     Extraocular Movements: Extraocular movements intact.     Conjunctiva/sclera: Conjunctivae normal.     Pupils: Pupils are equal, round, and reactive to light.  Cardiovascular:     Rate and Rhythm: Normal rate and regular rhythm.     Heart sounds: No murmur heard.    No friction rub. No gallop.  Pulmonary:     Effort: Pulmonary effort is normal.     Breath sounds: Normal breath sounds.  Abdominal:     General: Abdomen is flat.     Palpations: Abdomen is soft.  Skin:    General: Skin is warm and dry.  Neurological:     General: No focal deficit present.     Mental Status: She is alert and oriented to person, place, and time.  Psychiatric:        Mood and Affect: Mood normal.     Lab Results: Lab Results  Component Value Date   WBC 9.3 03/31/2024   HGB 12.9 03/31/2024   HCT 44.2 03/31/2024   MCV 85.5 03/31/2024   PLT 332 03/31/2024    Lab Results  Component Value Date   CREATININE 0.57 03/31/2024   BUN 15 03/31/2024   NA 137 03/31/2024   K 4.0 03/31/2024   CL 105 03/31/2024   CO2 22 03/31/2024    Lab Results  Component Value Date   ALT 11 03/19/2024   AST 19 03/19/2024   ALKPHOS 90 03/19/2024   BILITOT 0.4 03/19/2024     Assessment & Plan:  #Left knee PJI #RA on HCQ and leflunemide --Case for left TKA on August 05, 2023 complicated by instability underwent revision on 915/25 synovectomy cultures grew corynebacterium minutissimum.  ID was called, per documentation 9/19 per Dr. Issac No concern of infection in OR blood cultures unexpectedly grew corynebacterium asking if patient needs to be on IV antibiotics.  Placed on linezolid till ID appointment. Plan -will treat as pji given pt  stated knee was erythematous and swollen follwing initial OR in jaunary, also some concern that she may have been taking leflunemide at that time. She thinks she stopped in oct 2024, refill 90 day dispense history  in November. -OPAT orders placed for daptomycin x 6 weeks EOT 11/17 then augmentin  x 6 months(based on literature and sens of the above species). PICC appt on 10/7, continue linezolid till then(she has 2 days left) -F/U with ID 6 weeks from picc placement -Hold statin -Met with pharmacy   OPAT ORDERS:  Diagnosis: left knee pji  Culture Result: corynebacterium minutissimum  Allergies  Allergen Reactions   Codeine Nausea And Vomiting and Diarrhea   Meperidine      Other reaction(s): Delusions (intolerance)     Discharge antibiotics to be given via PICC line:  Per pharmacy protocol: daptomycin 8 mg/kg/day   Duration: 6 weeks End Date: 06/01/24  [facility] Care Per Protocol with [employer] Use: Home health RN for IV administration and teaching, line care and labs.    Labs weekly while on IV antibiotics: _x_ CBC with differential __ BMP **TWICE WEEKLY ON VANCOMYCIN  _x_ CMP __ xCRP __x ESR __ Vancomycin trough TWICE WEEKLY x__ CK  _x_ Please pull PIC at completion of IV antibiotics __ Please leave PIC in place until doctor has seen patient or been notified  Fax weekly labs to [phone]  Clinic Follow Up Appt: 6 weeks prior to picc being pulled  Loney Stank, MD [facility]   04/15/24  1:37 PM I have personally spent 74 minutes involved in face-to-face and non-face-to-face activities for this patient on the day of the visit. Professional time spent includes the following activities: Preparing to see the patient (review of tests), Obtaining and/or reviewing separately obtained history (admission/discharge record), Performing a medically appropriate examination and/or evaluation , Ordering  medications/tests/procedures, referring and communicating with other health care professionals, Documenting clinical information in the EMR, Independently interpreting results (not separately reported), Communicating results to  the patient/family/caregiver, Counseling and educating the patient/family/caregiver and Care coordination (not separately reported).

## 2024-04-16 ENCOUNTER — Encounter: Payer: Self-pay | Admitting: Internal Medicine

## 2024-04-16 DIAGNOSIS — M25562 Pain in left knee: Secondary | ICD-10-CM | POA: Diagnosis not present

## 2024-04-16 LAB — COMPLETE METABOLIC PANEL WITHOUT GFR
AG Ratio: 1.4 (calc) (ref 1.0–2.5)
ALT: 9 U/L (ref 6–29)
AST: 12 U/L (ref 10–35)
Albumin: 3.8 g/dL (ref 3.6–5.1)
Alkaline phosphatase (APISO): 77 U/L (ref 37–153)
BUN: 20 mg/dL (ref 7–25)
CO2: 26 mmol/L (ref 20–32)
Calcium: 9.5 mg/dL (ref 8.6–10.4)
Chloride: 103 mmol/L (ref 98–110)
Creat: 0.71 mg/dL (ref 0.60–1.00)
Globulin: 2.7 g/dL (ref 1.9–3.7)
Glucose, Bld: 110 mg/dL — ABNORMAL HIGH (ref 65–99)
Potassium: 4 mmol/L (ref 3.5–5.3)
Sodium: 138 mmol/L (ref 135–146)
Total Bilirubin: 0.4 mg/dL (ref 0.2–1.2)
Total Protein: 6.5 g/dL (ref 6.1–8.1)

## 2024-04-16 LAB — CBC WITH DIFFERENTIAL/PLATELET
Absolute Lymphocytes: 1465 {cells}/uL (ref 850–3900)
Absolute Monocytes: 475 {cells}/uL (ref 200–950)
Basophils Absolute: 9 {cells}/uL (ref 0–200)
Basophils Relative: 0.2 %
Eosinophils Absolute: 62 {cells}/uL (ref 15–500)
Eosinophils Relative: 1.4 %
HCT: 40.9 % (ref 35.0–45.0)
Hemoglobin: 12.9 g/dL (ref 11.7–15.5)
MCH: 25.6 pg — ABNORMAL LOW (ref 27.0–33.0)
MCHC: 31.5 g/dL — ABNORMAL LOW (ref 32.0–36.0)
MCV: 81.3 fL (ref 80.0–100.0)
MPV: 9.3 fL (ref 7.5–12.5)
Monocytes Relative: 10.8 %
Neutro Abs: 2389 {cells}/uL (ref 1500–7800)
Neutrophils Relative %: 54.3 %
Platelets: 282 Thousand/uL (ref 140–400)
RBC: 5.03 Million/uL (ref 3.80–5.10)
RDW: 13.9 % (ref 11.0–15.0)
Total Lymphocyte: 33.3 %
WBC: 4.4 Thousand/uL (ref 3.8–10.8)

## 2024-04-21 ENCOUNTER — Ambulatory Visit (HOSPITAL_COMMUNITY)
Admission: RE | Admit: 2024-04-21 | Discharge: 2024-04-21 | Disposition: A | Discharge status: Home / Self Care | Source: Ambulatory Visit | Attending: Internal Medicine | Admitting: Internal Medicine

## 2024-04-21 ENCOUNTER — Other Ambulatory Visit: Payer: Self-pay | Admitting: Internal Medicine

## 2024-04-21 DIAGNOSIS — T8450XS Infection and inflammatory reaction due to unspecified internal joint prosthesis, sequela: Secondary | ICD-10-CM

## 2024-04-21 DIAGNOSIS — Y839 Surgical procedure, unspecified as the cause of abnormal reaction of the patient, or of later complication, without mention of misadventure at the time of the procedure: Secondary | ICD-10-CM | POA: Diagnosis not present

## 2024-04-21 DIAGNOSIS — T8450XA Infection and inflammatory reaction due to unspecified internal joint prosthesis, initial encounter: Secondary | ICD-10-CM | POA: Diagnosis not present

## 2024-04-21 MED ORDER — LIDOCAINE HCL (PF) 1 % IJ SOLN
30.0000 mL | Freq: Once | INTRAMUSCULAR | Status: AC
  Administered 2024-04-21: 8 mL via INTRADERMAL

## 2024-04-21 MED ORDER — HEPARIN SOD (PORK) LOCK FLUSH 100 UNIT/ML IV SOLN
INTRAVENOUS | Status: AC
  Filled 2024-04-21: qty 5

## 2024-04-21 MED ORDER — LIDOCAINE HCL 1 % IJ SOLN
INTRAMUSCULAR | Status: AC
  Filled 2024-04-21: qty 20

## 2024-04-21 NOTE — Procedures (Signed)
 PROCEDURE SUMMARY:  Successful placement of single lumen PICC line to brachial vein. Length 40cm Tip at lower SVC/RA No complications PICC capped Ready for use. EBL = trace  Please see full dictation in Imaging section for details.   Daylani Deblois NP 04/21/2024 9:33 AM

## 2024-04-22 DIAGNOSIS — T84023D Instability of internal left knee prosthesis, subsequent encounter: Secondary | ICD-10-CM | POA: Diagnosis not present

## 2024-04-22 DIAGNOSIS — M5416 Radiculopathy, lumbar region: Secondary | ICD-10-CM | POA: Diagnosis not present

## 2024-04-22 DIAGNOSIS — I519 Heart disease, unspecified: Secondary | ICD-10-CM | POA: Diagnosis not present

## 2024-04-22 DIAGNOSIS — I700 Atherosclerosis of aorta: Secondary | ICD-10-CM | POA: Diagnosis not present

## 2024-04-22 DIAGNOSIS — E785 Hyperlipidemia, unspecified: Secondary | ICD-10-CM | POA: Diagnosis not present

## 2024-04-22 DIAGNOSIS — E1169 Type 2 diabetes mellitus with other specified complication: Secondary | ICD-10-CM | POA: Diagnosis not present

## 2024-04-22 DIAGNOSIS — I119 Hypertensive heart disease without heart failure: Secondary | ICD-10-CM | POA: Diagnosis not present

## 2024-04-22 DIAGNOSIS — T8454XA Infection and inflammatory reaction due to internal left knee prosthesis, initial encounter: Secondary | ICD-10-CM | POA: Diagnosis not present

## 2024-04-22 DIAGNOSIS — B9689 Other specified bacterial agents as the cause of diseases classified elsewhere: Secondary | ICD-10-CM | POA: Diagnosis not present

## 2024-04-23 DIAGNOSIS — M25562 Pain in left knee: Secondary | ICD-10-CM | POA: Diagnosis not present

## 2024-04-27 DIAGNOSIS — E1169 Type 2 diabetes mellitus with other specified complication: Secondary | ICD-10-CM | POA: Diagnosis not present

## 2024-04-27 DIAGNOSIS — I700 Atherosclerosis of aorta: Secondary | ICD-10-CM | POA: Diagnosis not present

## 2024-04-27 DIAGNOSIS — T8454XA Infection and inflammatory reaction due to internal left knee prosthesis, initial encounter: Secondary | ICD-10-CM | POA: Diagnosis not present

## 2024-04-27 DIAGNOSIS — I519 Heart disease, unspecified: Secondary | ICD-10-CM | POA: Diagnosis not present

## 2024-04-27 DIAGNOSIS — M5416 Radiculopathy, lumbar region: Secondary | ICD-10-CM | POA: Diagnosis not present

## 2024-04-27 DIAGNOSIS — T84023D Instability of internal left knee prosthesis, subsequent encounter: Secondary | ICD-10-CM | POA: Diagnosis not present

## 2024-04-27 DIAGNOSIS — B9689 Other specified bacterial agents as the cause of diseases classified elsewhere: Secondary | ICD-10-CM | POA: Diagnosis not present

## 2024-04-27 DIAGNOSIS — E785 Hyperlipidemia, unspecified: Secondary | ICD-10-CM | POA: Diagnosis not present

## 2024-04-27 DIAGNOSIS — I119 Hypertensive heart disease without heart failure: Secondary | ICD-10-CM | POA: Diagnosis not present

## 2024-04-28 DIAGNOSIS — M25562 Pain in left knee: Secondary | ICD-10-CM | POA: Diagnosis not present

## 2024-04-29 DIAGNOSIS — T8454XA Infection and inflammatory reaction due to internal left knee prosthesis, initial encounter: Secondary | ICD-10-CM | POA: Diagnosis not present

## 2024-04-30 DIAGNOSIS — M25562 Pain in left knee: Secondary | ICD-10-CM | POA: Diagnosis not present

## 2024-05-04 ENCOUNTER — Encounter: Admitting: Internal Medicine

## 2024-05-04 DIAGNOSIS — M5416 Radiculopathy, lumbar region: Secondary | ICD-10-CM | POA: Diagnosis not present

## 2024-05-04 DIAGNOSIS — E785 Hyperlipidemia, unspecified: Secondary | ICD-10-CM | POA: Diagnosis not present

## 2024-05-04 DIAGNOSIS — I700 Atherosclerosis of aorta: Secondary | ICD-10-CM | POA: Diagnosis not present

## 2024-05-04 DIAGNOSIS — T84023D Instability of internal left knee prosthesis, subsequent encounter: Secondary | ICD-10-CM | POA: Diagnosis not present

## 2024-05-04 DIAGNOSIS — I519 Heart disease, unspecified: Secondary | ICD-10-CM | POA: Diagnosis not present

## 2024-05-04 DIAGNOSIS — T8454XA Infection and inflammatory reaction due to internal left knee prosthesis, initial encounter: Secondary | ICD-10-CM | POA: Diagnosis not present

## 2024-05-04 DIAGNOSIS — T8459XA Infection and inflammatory reaction due to other internal joint prosthesis, initial encounter: Secondary | ICD-10-CM | POA: Diagnosis not present

## 2024-05-04 DIAGNOSIS — B9689 Other specified bacterial agents as the cause of diseases classified elsewhere: Secondary | ICD-10-CM | POA: Diagnosis not present

## 2024-05-04 DIAGNOSIS — I119 Hypertensive heart disease without heart failure: Secondary | ICD-10-CM | POA: Diagnosis not present

## 2024-05-04 DIAGNOSIS — E1169 Type 2 diabetes mellitus with other specified complication: Secondary | ICD-10-CM | POA: Diagnosis not present

## 2024-05-04 NOTE — Patient Instructions (Incomplete)

## 2024-05-04 NOTE — Progress Notes (Deleted)
 I,[name], CMA,acting as a Neurosurgeon for Grace LOISE Slocumb, MD.,have documented all relevant documentation on the behalf of Grace LOISE Slocumb, MD,as directed by  Grace LOISE Slocumb, MD while in the presence of Grace LOISE Slocumb, MD.  Subjective:    Patient ID: [name] , female    DOB: Jun 21, 1951 , 73 y.o.   MRN: [id number]  No chief complaint on file.   HPI  Patient here for annual exam. She is no longer followed by a GYN provider. She reports compliance with meds. She denies having any headaches, chest pain and shortness of breath. She states her sugars range between 73-129.  She has no specific concerns or complaints at this time.   Diabetes She presents for her follow-up diabetic visit. She has type 2 diabetes mellitus. Her disease course has been improving. There are no hypoglycemic associated symptoms. There are no diabetic associated symptoms. Pertinent negatives for diabetes include no blurred vision. There are no hypoglycemic complications. Risk factors for coronary artery disease include diabetes mellitus, hypertension, obesity and post-menopausal. She is compliant with treatment most of the time. Her breakfast blood glucose is taken between 8-9 am. Her breakfast blood glucose range is generally 70-90 mg/dl. Eye exam is current.  Hypertension This is a chronic problem. The current episode started more than 1 year ago. The problem is unchanged. The problem is controlled. Pertinent negatives include no blurred vision, neck pain, orthopnea or PND. Past treatments include angiotensin blockers, beta blockers and diuretics. The current treatment provides moderate improvement. There are no compliance problems.  There is no history of chronic renal disease or coarctation of the aorta.     Past Medical History:  Diagnosis Date   Allergy 1995   Arthritis 1990   Diabetes mellitus without complication (HCC)    Encounter for annual health examination 04/17/2023   GERD (gastroesophageal reflux  disease)    Headache    High cholesterol    Hypertension    Sleep apnea    mild- no CPAP   Stroke (HCC) 07/17/2003   mini- stroke due to HTN     Family History  Problem Relation Age of Onset   Cancer Mother    Hypertension Mother    Cancer Maternal Grandmother    Hypertension Maternal Grandmother    Arthritis Maternal Grandmother    Cancer Sister      Current Outpatient Medications:    acetaminophen  (TYLENOL ) 500 MG tablet, Take 1,000 mg by mouth daily as needed (pain.)., Disp: , Rfl:    Alcohol  Swabs (DROPSAFE ALCOHOL  PREP) 70 % PADS, USE AS DIRECTED WHEN CHECKING BLOOD SUGARS, Disp: 300 each, Rfl: 3   amoxicillin  (AMOXIL ) 500 MG tablet, Take 2,000 mg by mouth See admin instructions. Take 4 tablets (2000 mg) by mouth 1 hour prior to dental appointments. (Patient taking differently: Take 2,000 mg by mouth as needed (dental visits). Take 4 tablets (2000 mg) by mouth 1 hour prior to dental appointments.), Disp: , Rfl:    atorvastatin  (LIPITOR) 10 MG tablet, Take one tab po daily M-Friday and skip Saturday and Sundays, Disp: 90 tablet, Rfl: 1   Blood Glucose Calibration (TRUE METRIX LEVEL 1) Low SOLN, USE AS DIRECTED WITH GLUCOSE METER, Disp: 1 each, Rfl: 3   Blood Glucose Monitoring Suppl (TRUE METRIX AIR GLUCOSE METER) w/Device KIT, Inject 1 kit into the skin daily. Use as directed to check blood sugars 1 time per dx: e11.22, Disp: 1 kit, Rfl: 1   Cholecalciferol  (VITAMIN D ) 2000 UNITS  tablet, Take 2,000 Units by mouth every evening., Disp: , Rfl:    gabapentin  (NEURONTIN ) 300 MG capsule, Take 300 mg by mouth at bedtime., Disp: , Rfl:    glucose blood (TRUE METRIX BLOOD GLUCOSE TEST) test strip, USE AS INSTRUCTED, Disp: 300 strip, Rfl: 3   latanoprost  (XALATAN ) 0.005 % ophthalmic solution, Place 1 drop into both eyes at bedtime., Disp: , Rfl:    levocetirizine (XYZAL ) 5 MG tablet, Take 5 mg by mouth daily as needed for allergies., Disp: , Rfl:    metFORMIN  (GLUCOPHAGE -XR) 750 MG 24 hr  tablet, TAKE 1 TABLET EVERY DAY WITH EVENING MEAL, Disp: 90 tablet, Rfl: 3   methocarbamol  (ROBAXIN ) 500 MG tablet, Take by mouth., Disp: , Rfl:    montelukast  (SINGULAIR ) 10 MG tablet, TAKE 1 TABLET(10 MG) BY MOUTH DAILY (Patient taking differently: Take 10 mg by mouth daily as needed (allergies.).), Disp: 30 tablet, Rfl: 2   Multiple Vitamin (MULTIVITAMIN WITH MINERALS) TABS tablet, Take 1 tablet by mouth daily in the afternoon., Disp: , Rfl:    Olmesartan -amLODIPine -HCTZ 40-5-12.5 MG TABS, TAKE 1 TABLET EVERY DAY, Disp: 90 tablet, Rfl: 3   omeprazole  (PRILOSEC) 40 MG capsule, Take 1 capsule (40 mg total) by mouth daily for 21 days., Disp: 21 capsule, Rfl: 0   oxyCODONE  (OXY IR/ROXICODONE ) 5 MG immediate release tablet, Take 5 mg by mouth every 4 (four) hours as needed., Disp: , Rfl:    Semaglutide , 2 MG/DOSE, (OZEMPIC , 2 MG/DOSE,) 8 MG/3ML SOPN, Inject 2 mg into the skin once a week., Disp: 3 mL, Rfl: 3   Triamcinolone  Acetonide (NASACORT  AQ NA), Place 1-2 sprays into both nostrils daily as needed (nasal congestion/allergies.)., Disp: , Rfl:    TRUEplus Lancets 33G MISC, USE AS DIRECTED, Disp: 300 each, Rfl: 3   Allergies  Allergen Reactions   Codeine Nausea And Vomiting and Diarrhea   Meperidine      Other reaction(s): Delusions (intolerance)      The patient states she uses {contraceptive methods:5051} for birth control. Patient's last menstrual period was 01/23/2013.. {Dysmenorrhea-menorrhagia:21918}. Negative for: breast discharge, breast lump(s), breast pain and breast self exam. Associated symptoms include abnormal vaginal bleeding. Pertinent negatives include abnormal bleeding (hematology), anxiety, decreased libido, depression, difficulty falling sleep, dyspareunia, history of infertility, nocturia, sexual dysfunction, sleep disturbances, urinary incontinence, urinary urgency, vaginal discharge and vaginal itching. Diet regular.The patient states her exercise level is    . The patient's  tobacco use is:  Social History   Tobacco Use  Smoking Status Never  Smokeless Tobacco Never  . She has been exposed to passive smoke. The patient's alcohol  use is:  Social History   Substance and Sexual Activity  Alcohol  Use Yes   Alcohol /week: 1.0 standard drink of alcohol    Types: 1 Shots of liquor per week   Comment: occassionally  . Additional information: Last pap ***, next one scheduled for ***.    Review of Systems  Constitutional: Negative.   HENT: Negative.    Eyes: Negative.  Negative for blurred vision.  Respiratory: Negative.    Cardiovascular: Negative.  Negative for orthopnea and PND.  Gastrointestinal: Negative.   Endocrine: Negative.   Genitourinary: Negative.   Musculoskeletal: Negative.  Negative for neck pain.  Skin: Negative.   Allergic/Immunologic: Negative.   Neurological: Negative.   Hematological: Negative.   Psychiatric/Behavioral: Negative.       There were no vitals filed for this visit. There is no height or weight on file to calculate BMI.  Wt Readings from  Last 3 Encounters:  03/30/24 243 lb (110.2 kg)  03/19/24 243 lb (110.2 kg)  03/04/24 244 lb 9.6 oz (110.9 kg)     Objective:  Physical Exam      Assessment And Plan:     Encounter for annual health examination  Hypertensive heart disease without heart failure  Dyslipidemia associated with type 2 diabetes mellitus (HCC)  Aortic atherosclerosis  Aortic valve calcification     No follow-ups on file. Patient was given opportunity to ask questions. Patient verbalized understanding of the plan and was able to repeat key elements of the plan. All questions were answered to their satisfaction.   Grace LOISE Slocumb, MD  I, Grace LOISE Slocumb, MD, have reviewed all documentation for this visit. The documentation on 05/04/24 for the exam, diagnosis, procedures, and orders are all accurate and complete.

## 2024-05-06 DIAGNOSIS — T8454XA Infection and inflammatory reaction due to internal left knee prosthesis, initial encounter: Secondary | ICD-10-CM | POA: Diagnosis not present

## 2024-05-11 DIAGNOSIS — I519 Heart disease, unspecified: Secondary | ICD-10-CM | POA: Diagnosis not present

## 2024-05-11 DIAGNOSIS — T8454XA Infection and inflammatory reaction due to internal left knee prosthesis, initial encounter: Secondary | ICD-10-CM | POA: Diagnosis not present

## 2024-05-11 DIAGNOSIS — B9689 Other specified bacterial agents as the cause of diseases classified elsewhere: Secondary | ICD-10-CM | POA: Diagnosis not present

## 2024-05-11 DIAGNOSIS — T84023D Instability of internal left knee prosthesis, subsequent encounter: Secondary | ICD-10-CM | POA: Diagnosis not present

## 2024-05-11 DIAGNOSIS — E1169 Type 2 diabetes mellitus with other specified complication: Secondary | ICD-10-CM | POA: Diagnosis not present

## 2024-05-11 DIAGNOSIS — E785 Hyperlipidemia, unspecified: Secondary | ICD-10-CM | POA: Diagnosis not present

## 2024-05-11 DIAGNOSIS — I119 Hypertensive heart disease without heart failure: Secondary | ICD-10-CM | POA: Diagnosis not present

## 2024-05-11 DIAGNOSIS — I700 Atherosclerosis of aorta: Secondary | ICD-10-CM | POA: Diagnosis not present

## 2024-05-11 DIAGNOSIS — M5416 Radiculopathy, lumbar region: Secondary | ICD-10-CM | POA: Diagnosis not present

## 2024-05-12 DIAGNOSIS — Z96652 Presence of left artificial knee joint: Secondary | ICD-10-CM | POA: Diagnosis not present

## 2024-05-12 DIAGNOSIS — Z471 Aftercare following joint replacement surgery: Secondary | ICD-10-CM | POA: Diagnosis not present

## 2024-05-13 DIAGNOSIS — T8454XA Infection and inflammatory reaction due to internal left knee prosthesis, initial encounter: Secondary | ICD-10-CM | POA: Diagnosis not present

## 2024-05-14 DIAGNOSIS — M25562 Pain in left knee: Secondary | ICD-10-CM | POA: Diagnosis not present

## 2024-05-18 DIAGNOSIS — I700 Atherosclerosis of aorta: Secondary | ICD-10-CM | POA: Diagnosis not present

## 2024-05-18 DIAGNOSIS — E1169 Type 2 diabetes mellitus with other specified complication: Secondary | ICD-10-CM | POA: Diagnosis not present

## 2024-05-18 DIAGNOSIS — E785 Hyperlipidemia, unspecified: Secondary | ICD-10-CM | POA: Diagnosis not present

## 2024-05-18 DIAGNOSIS — T8454XA Infection and inflammatory reaction due to internal left knee prosthesis, initial encounter: Secondary | ICD-10-CM | POA: Diagnosis not present

## 2024-05-18 DIAGNOSIS — I119 Hypertensive heart disease without heart failure: Secondary | ICD-10-CM | POA: Diagnosis not present

## 2024-05-18 DIAGNOSIS — I519 Heart disease, unspecified: Secondary | ICD-10-CM | POA: Diagnosis not present

## 2024-05-18 DIAGNOSIS — B9689 Other specified bacterial agents as the cause of diseases classified elsewhere: Secondary | ICD-10-CM | POA: Diagnosis not present

## 2024-05-18 DIAGNOSIS — T84023D Instability of internal left knee prosthesis, subsequent encounter: Secondary | ICD-10-CM | POA: Diagnosis not present

## 2024-05-18 DIAGNOSIS — M5416 Radiculopathy, lumbar region: Secondary | ICD-10-CM | POA: Diagnosis not present

## 2024-05-19 DIAGNOSIS — M25562 Pain in left knee: Secondary | ICD-10-CM | POA: Diagnosis not present

## 2024-05-20 ENCOUNTER — Telehealth: Payer: Self-pay | Admitting: Pharmacist

## 2024-05-20 DIAGNOSIS — T8454XA Infection and inflammatory reaction due to internal left knee prosthesis, initial encounter: Secondary | ICD-10-CM | POA: Diagnosis not present

## 2024-05-20 NOTE — Telephone Encounter (Signed)
 FYI Dr. Dennise - patient's CK slightly elevated over the last two weeks. 10/20 = 207, 11/3 = 364. Will continue to monitor closely; no changes at this time. Kidney function remains stable (Scr < 1).   Alan Geralds, PharmD, CPP, BCIDP, AAHIVP Clinical Pharmacist Practitioner Infectious Diseases Clinical Pharmacist [facility]

## 2024-05-21 ENCOUNTER — Other Ambulatory Visit: Payer: Self-pay

## 2024-05-21 MED ORDER — GABAPENTIN 300 MG PO CAPS: 300.0000 mg | ORAL_CAPSULE | Freq: Every day | ORAL | 2 refills | Status: AC

## 2024-05-25 DIAGNOSIS — I119 Hypertensive heart disease without heart failure: Secondary | ICD-10-CM | POA: Diagnosis not present

## 2024-05-25 DIAGNOSIS — E785 Hyperlipidemia, unspecified: Secondary | ICD-10-CM | POA: Diagnosis not present

## 2024-05-25 DIAGNOSIS — I519 Heart disease, unspecified: Secondary | ICD-10-CM | POA: Diagnosis not present

## 2024-05-25 DIAGNOSIS — B9689 Other specified bacterial agents as the cause of diseases classified elsewhere: Secondary | ICD-10-CM | POA: Diagnosis not present

## 2024-05-25 DIAGNOSIS — E1169 Type 2 diabetes mellitus with other specified complication: Secondary | ICD-10-CM | POA: Diagnosis not present

## 2024-05-25 DIAGNOSIS — T84023D Instability of internal left knee prosthesis, subsequent encounter: Secondary | ICD-10-CM | POA: Diagnosis not present

## 2024-05-25 DIAGNOSIS — M5416 Radiculopathy, lumbar region: Secondary | ICD-10-CM | POA: Diagnosis not present

## 2024-05-25 DIAGNOSIS — I700 Atherosclerosis of aorta: Secondary | ICD-10-CM | POA: Diagnosis not present

## 2024-05-25 DIAGNOSIS — T8454XA Infection and inflammatory reaction due to internal left knee prosthesis, initial encounter: Secondary | ICD-10-CM | POA: Diagnosis not present

## 2024-05-27 ENCOUNTER — Telehealth: Payer: Self-pay

## 2024-05-27 ENCOUNTER — Encounter: Payer: Self-pay | Admitting: Internal Medicine

## 2024-05-27 ENCOUNTER — Ambulatory Visit: Admitting: Internal Medicine

## 2024-05-27 ENCOUNTER — Other Ambulatory Visit: Payer: Self-pay

## 2024-05-27 VITALS — BP 152/85 | HR 72 | Temp 97.8°F

## 2024-05-27 DIAGNOSIS — T8454XS Infection and inflammatory reaction due to internal left knee prosthesis, sequela: Secondary | ICD-10-CM | POA: Diagnosis not present

## 2024-05-27 DIAGNOSIS — T8450XS Infection and inflammatory reaction due to unspecified internal joint prosthesis, sequela: Secondary | ICD-10-CM

## 2024-05-27 DIAGNOSIS — M069 Rheumatoid arthritis, unspecified: Secondary | ICD-10-CM | POA: Diagnosis not present

## 2024-05-27 DIAGNOSIS — T8454XA Infection and inflammatory reaction due to internal left knee prosthesis, initial encounter: Secondary | ICD-10-CM | POA: Diagnosis not present

## 2024-05-27 DIAGNOSIS — T8454XD Infection and inflammatory reaction due to internal left knee prosthesis, subsequent encounter: Secondary | ICD-10-CM | POA: Diagnosis not present

## 2024-05-27 LAB — LAB REPORT - SCANNED: EGFR: 95

## 2024-05-27 MED ORDER — AMOXICILLIN-POT CLAVULANATE 875-125 MG PO TABS
1.0000 | ORAL_TABLET | Freq: Two times a day (BID) | ORAL | 5 refills | Status: AC
Start: 2024-05-27 — End: ?

## 2024-05-27 NOTE — Telephone Encounter (Signed)
 Per Dr. Dennise the end date for patient is on 11/17.Sent a message to Holley Herring, RN at [employer].

## 2024-05-27 NOTE — Patient Instructions (Addendum)
-  Pull picc after last dose of abx on 11/17 start augmetin on 11/18 -Please engage ortho for knee -F/U in a month

## 2024-05-27 NOTE — Progress Notes (Signed)
 Patient Active Problem List   Diagnosis Date Noted   Instability of internal left knee prosthesis 03/30/2024   Instability of left knee joint 12/16/2023   Hypertensive heart disease without heart failure 09/17/2023   Lumbar radiculopathy 09/17/2023   Preoperative clearance 06/16/2023   Seasonal allergic rhinitis 05/20/2023   Right ear pain 05/20/2023   Postnasal drip 05/20/2023   Otalgia of right ear 05/20/2023   Encounter for annual health examination 04/17/2023   Aortic atherosclerosis 04/17/2023   Estrogen deficiency 04/17/2023   Dyslipidemia associated with type 2 diabetes mellitus (HCC) 12/07/2021   Pure hypercholesterolemia 12/07/2021   Arthritis of hand 07/01/2021   Pain in right hand 06/29/2021   Pain of left hand 06/29/2021   Carpal tunnel syndrome 06/29/2021   Pain in thoracic spine 10/07/2020   OSA on CPAP 07/30/2018   Left hip pain 07/02/2018   Snoring 02/27/2018   Intermittent sleepiness 02/27/2018   Sleep pattern disturbance 02/27/2018   Obesity (BMI 35.0-39.9 without comorbidity) 02/27/2018   Essential hypertension, benign 02/27/2018   Hyperlipidemia 02/20/2013   Hypertension 02/20/2013    Patient's Medications  New Prescriptions   No medications on file  Previous Medications   ACETAMINOPHEN  (TYLENOL ) 500 MG TABLET    Take 1,000 mg by mouth daily as needed (pain.).   ALCOHOL  SWABS (DROPSAFE ALCOHOL  PREP) 70 % PADS    USE AS DIRECTED WHEN CHECKING BLOOD SUGARS   AMOXICILLIN  (AMOXIL ) 500 MG TABLET    Take 2,000 mg by mouth See admin instructions. Take 4 tablets (2000 mg) by mouth 1 hour prior to dental appointments.   ATORVASTATIN  (LIPITOR) 10 MG TABLET    Take one tab po daily M-Friday and skip Saturday and Sundays   BLOOD GLUCOSE CALIBRATION (TRUE METRIX LEVEL 1) LOW SOLN    USE AS DIRECTED WITH GLUCOSE METER   BLOOD GLUCOSE MONITORING SUPPL (TRUE METRIX AIR GLUCOSE METER) W/DEVICE KIT    Inject 1 kit into the skin daily. Use as directed to  check blood sugars 1 time per dx: e11.22   CHOLECALCIFEROL  (VITAMIN D ) 2000 UNITS TABLET    Take 2,000 Units by mouth every evening.   GABAPENTIN  (NEURONTIN ) 300 MG CAPSULE    Take 1 capsule (300 mg total) by mouth at bedtime.   GLUCOSE BLOOD (TRUE METRIX BLOOD GLUCOSE TEST) TEST STRIP    USE AS INSTRUCTED   LATANOPROST  (XALATAN ) 0.005 % OPHTHALMIC SOLUTION    Place 1 drop into both eyes at bedtime.   LEVOCETIRIZINE (XYZAL ) 5 MG TABLET    Take 5 mg by mouth daily as needed for allergies.   METFORMIN  (GLUCOPHAGE -XR) 750 MG 24 HR TABLET    TAKE 1 TABLET EVERY DAY WITH EVENING MEAL   METHOCARBAMOL  (ROBAXIN ) 500 MG TABLET    Take by mouth.   MONTELUKAST  (SINGULAIR ) 10 MG TABLET    TAKE 1 TABLET(10 MG) BY MOUTH DAILY   MULTIPLE VITAMIN (MULTIVITAMIN WITH MINERALS) TABS TABLET    Take 1 tablet by mouth daily in the afternoon.   OLMESARTAN -AMLODIPINE -HCTZ 40-5-12.5 MG TABS    TAKE 1 TABLET EVERY DAY   OMEPRAZOLE  (PRILOSEC) 40 MG CAPSULE    Take 1 capsule (40 mg total) by mouth daily for 21 days.   OXYCODONE  (OXY IR/ROXICODONE ) 5 MG IMMEDIATE RELEASE TABLET    Take 5 mg by mouth every 4 (four) hours as needed.   SEMAGLUTIDE , 2 MG/DOSE, (OZEMPIC , 2 MG/DOSE,) 8 MG/3ML SOPN    Inject 2 mg into  the skin once a week.   TRIAMCINOLONE  ACETONIDE (NASACORT  AQ NA)    Place 1-2 sprays into both nostrils daily as needed (nasal congestion/allergies.).   TRUEPLUS LANCETS 33G MISC    USE AS DIRECTED  Modified Medications   No medications on file  Discontinued Medications   No medications on file    Subjective:  73 y.o. Rheumatoid arthritis on hydroxychloroquine and leflunomide presents for concern for left knee PJI.  Patient had underwent left total knee arthroplasty on August 05, 2023 for prior right osteoarthritis. Struggled after surgery regaining strength and mobility.  Given concern of excessive laxity in her knee in both flexion and extension she was taken to the OR on 03/30/2024 with Dr. Marchione for  revision of left total knee arthroplasty, liner exchange, synovectomy scar excision.  No purulence commented per OR note.  Culture was obtained from circumferential synovectomy which grew corynebacterium minutissimum.  ID was called on 9/19 recommended start linezolid  as a bridge to ID appointment. Past medical history of diabetes mellitus hypertension, hypercholesterolemia, CVA Review of Systems: Review of Systems  All other systems reviewed and are negative.   Past Medical History:  Diagnosis Date   Allergy 1995   Arthritis 1990   Diabetes mellitus without complication (HCC)    Encounter for annual health examination 04/17/2023   GERD (gastroesophageal reflux disease)    Headache    High cholesterol    Hypertension    Sleep apnea    mild- no CPAP   Stroke (HCC) 07/17/2003   mini- stroke due to HTN    Social History   Tobacco Use   Smoking status: Never   Smokeless tobacco: Never  Vaping Use   Vaping status: Never Used  Substance Use Topics   Alcohol  use: Yes    Alcohol /week: 1.0 standard drink of alcohol     Types: 1 Shots of liquor per week    Comment: occassionally   Drug use: No    Family History  Problem Relation Age of Onset   Cancer Mother    Hypertension Mother    Cancer Maternal Grandmother    Hypertension Maternal Grandmother    Arthritis Maternal Grandmother    Cancer Sister     Allergies  Allergen Reactions   Codeine Nausea And Vomiting and Diarrhea   Meperidine      Other reaction(s): Delusions (intolerance)    Health Maintenance  Topic Date Due   COVID-19 Vaccine (3 - Pfizer risk series) 11/13/2019   Influenza Vaccine  02/14/2024   FOOT EXAM  04/15/2024   Diabetic kidney evaluation - Urine ACR  04/17/2024   Zoster Vaccines- Shingrix (1 of 2) 06/04/2024 (Originally 11/28/1969)   OPHTHALMOLOGY EXAM  07/31/2024   HEMOGLOBIN A1C  09/16/2024   Mammogram  11/03/2024   Medicare Annual Wellness (AWV)  03/04/2025   Diabetic kidney evaluation - eGFR  measurement  04/15/2025   DTaP/Tdap/Td (2 - Td or Tdap) 12/27/2027   Colonoscopy  12/11/2032   Pneumococcal Vaccine: 50+ Years  Completed   DEXA SCAN  Completed   Hepatitis C Screening  Completed   Meningococcal B Vaccine  Aged Out    Objective:  Vitals:   05/27/24 1539  BP: (!) 152/85  Pulse: 72  Temp: 97.8 F (36.6 C)  TempSrc: Oral  SpO2: 98%   There is no height or weight on file to calculate BMI.  Physical Exam Constitutional:      Appearance: Normal appearance.  HENT:     Head: Normocephalic and atraumatic.  Right Ear: Tympanic membrane normal.     Left Ear: Tympanic membrane normal.     Nose: Nose normal.     Mouth/Throat:     Mouth: Mucous membranes are moist.  Eyes:     Extraocular Movements: Extraocular movements intact.     Conjunctiva/sclera: Conjunctivae normal.     Pupils: Pupils are equal, round, and reactive to light.  Cardiovascular:     Rate and Rhythm: Normal rate and regular rhythm.     Heart sounds: No murmur heard.    No friction rub. No gallop.  Pulmonary:     Effort: Pulmonary effort is normal.     Breath sounds: Normal breath sounds.  Abdominal:     General: Abdomen is flat.     Palpations: Abdomen is soft.  Skin:    General: Skin is warm and dry.  Neurological:     General: No focal deficit present.     Mental Status: She is alert and oriented to person, place, and time.  Psychiatric:        Mood and Affect: Mood normal.    Lab Results Lab Results  Component Value Date   WBC 4.4 04/15/2024   HGB 12.9 04/15/2024   HCT 40.9 04/15/2024   MCV 81.3 04/15/2024   PLT 282 04/15/2024    Lab Results  Component Value Date   CREATININE 0.71 04/15/2024   BUN 20 04/15/2024   NA 138 04/15/2024   K 4.0 04/15/2024   CL 103 04/15/2024   CO2 26 04/15/2024    Lab Results  Component Value Date   ALT 9 04/15/2024   AST 12 04/15/2024   ALKPHOS 90 03/19/2024   BILITOT 0.4 04/15/2024    Lab Results  Component Value Date   CHOL 173  06/12/2023   HDL 63 06/12/2023   LDLCALC 88 06/12/2023   TRIG 129 06/12/2023   CHOLHDL 2.7 06/12/2023   No results found for: LABRPR, RPRTITER No results found for: HIV1RNAQUANT, HIV1RNAVL, CD4TABS   Problem List Items Addressed This Visit   None  Results   Assessment/Plan #Left knee PJI #RA on HCQ and leflunemide eft TKA on August 05, 2023 complicated by instability underwent revision on 915/25 synovectomy cultures grew corynebacterium minutissimum.  ID was called, per documentation 9/19 per Dr. Issac No concern of infection in OR blood cultures unexpectedly grew corynebacterium asking if patient needs to be on IV antibiotics.  Placed on linezolid  till ID appointment. Plan -will treat as pji given pt stated knee was erythematous and swollen follwing initial OR in jaunary, also some concern that she may have been taking leflunemide at that time. She thinks she stopped in oct 2024, refill 90 day dispense history  in November. - Patient completed daptomycin x 6 weeks EOT 11/17.  She stated knee pain persists, has seen Ortho in the interim.  Denies any muscle soreness.  Reviewed labs as below overall stable.  Wound without signs of active infection.  Will transition to Augmentin  following IV antibiotic completion for suppression. - Follow-up in 1 month. -Continue follow-up with orthopedics  #medication management #PICC -11/3 WBC 4.1, SCr 0.6, ESR 25, CRP 7, CK 364 . Monitoring ck closely -Pull picc after last dose of abx on 11/17 start augmetin on 11/18 -F/U in a month     Loney Stank, MD [facility] 05/27/2024, 4:04 PM   I personally spent a total of 44 minutes in the care of the patient today including preparing  to see the patient, getting/reviewing separately obtained history, performing a medically appropriate exam/evaluation, counseling and educating, placing orders, documenting clinical information in the  EHR, independently interpreting results, and communicating results.

## 2024-06-01 ENCOUNTER — Other Ambulatory Visit: Payer: Self-pay | Admitting: Internal Medicine

## 2024-06-01 DIAGNOSIS — I519 Heart disease, unspecified: Secondary | ICD-10-CM | POA: Diagnosis not present

## 2024-06-01 DIAGNOSIS — E1169 Type 2 diabetes mellitus with other specified complication: Secondary | ICD-10-CM | POA: Diagnosis not present

## 2024-06-01 DIAGNOSIS — T84023D Instability of internal left knee prosthesis, subsequent encounter: Secondary | ICD-10-CM | POA: Diagnosis not present

## 2024-06-01 DIAGNOSIS — M5416 Radiculopathy, lumbar region: Secondary | ICD-10-CM | POA: Diagnosis not present

## 2024-06-01 DIAGNOSIS — I119 Hypertensive heart disease without heart failure: Secondary | ICD-10-CM | POA: Diagnosis not present

## 2024-06-01 DIAGNOSIS — E785 Hyperlipidemia, unspecified: Secondary | ICD-10-CM | POA: Diagnosis not present

## 2024-06-01 DIAGNOSIS — I700 Atherosclerosis of aorta: Secondary | ICD-10-CM | POA: Diagnosis not present

## 2024-06-01 DIAGNOSIS — T8454XA Infection and inflammatory reaction due to internal left knee prosthesis, initial encounter: Secondary | ICD-10-CM | POA: Diagnosis not present

## 2024-06-01 DIAGNOSIS — B9689 Other specified bacterial agents as the cause of diseases classified elsewhere: Secondary | ICD-10-CM | POA: Diagnosis not present

## 2024-06-02 DIAGNOSIS — M5416 Radiculopathy, lumbar region: Secondary | ICD-10-CM | POA: Diagnosis not present

## 2024-06-10 ENCOUNTER — Ambulatory Visit (INDEPENDENT_AMBULATORY_CARE_PROVIDER_SITE_OTHER): Payer: Self-pay | Admitting: Internal Medicine

## 2024-06-10 ENCOUNTER — Encounter: Payer: Self-pay | Admitting: Internal Medicine

## 2024-06-10 VITALS — BP 120/78 | HR 77 | Temp 98.7°F | Ht 66.0 in | Wt 252.8 lb

## 2024-06-10 DIAGNOSIS — I119 Hypertensive heart disease without heart failure: Secondary | ICD-10-CM | POA: Diagnosis not present

## 2024-06-10 DIAGNOSIS — E785 Hyperlipidemia, unspecified: Secondary | ICD-10-CM

## 2024-06-10 DIAGNOSIS — Z0000 Encounter for general adult medical examination without abnormal findings: Secondary | ICD-10-CM | POA: Diagnosis not present

## 2024-06-10 DIAGNOSIS — E1169 Type 2 diabetes mellitus with other specified complication: Secondary | ICD-10-CM | POA: Diagnosis not present

## 2024-06-10 DIAGNOSIS — Z6841 Body Mass Index (BMI) 40.0 and over, adult: Secondary | ICD-10-CM

## 2024-06-10 DIAGNOSIS — I700 Atherosclerosis of aorta: Secondary | ICD-10-CM | POA: Diagnosis not present

## 2024-06-10 DIAGNOSIS — Z23 Encounter for immunization: Secondary | ICD-10-CM

## 2024-06-10 DIAGNOSIS — T84023S Instability of internal left knee prosthesis, sequela: Secondary | ICD-10-CM

## 2024-06-10 LAB — POCT URINALYSIS DIPSTICK
Bilirubin, UA: NEGATIVE
Blood, UA: NEGATIVE
Glucose, UA: NEGATIVE
Ketones, UA: NEGATIVE
Leukocytes, UA: NEGATIVE
Nitrite, UA: NEGATIVE
Protein, UA: NEGATIVE
Spec Grav, UA: 1.025 (ref 1.010–1.025)
Urobilinogen, UA: 0.2 U/dL
pH, UA: 5.5 (ref 5.0–8.0)

## 2024-06-10 NOTE — Patient Instructions (Signed)

## 2024-06-10 NOTE — Assessment & Plan Note (Addendum)
 A full exam was performed.  Importance of monthly self breast exams was discussed with the patient.  She is advised to get 30-45 minutes of regular exercise, no less than four to five days per week. Both weight-bearing and aerobic exercises are recommended.  She is advised to follow a healthy diet with at least six fruits/veggies per day, decrease intake of red meat and other saturated fats and to increase fish intake to twice weekly.  Meats/fish should not be fried -- baked, boiled or broiled is preferable. It is also important to cut back on your sugar intake.  Be sure to read labels - try to avoid anything with added sugar, high fructose corn syrup or other sweeteners.  If you must use a sweetener, you can try stevia or monkfruit.  It is also important to avoid artificially sweetened foods/beverages and diet drinks. Lastly, wear SPF 50 sunscreen on exposed skin and when in direct sunlight for an extended period of time.  Be sure to avoid fast food restaurants and aim for at least 60 ounces of water  daily.    - Administered flu shot. - Advised to wait on shingles vaccine as per infectious disease specialist. - Encouraged incorporation of bone broth into diet. - Encouraged consumption of healthy fats such as guacamole, avocado, and nuts.

## 2024-06-10 NOTE — Progress Notes (Signed)
 I,Victoria T Emmitt, CMA,acting as a neurosurgeon for [name], MD.,have documented all relevant documentation on the behalf of [name], MD,as directed by  [name], MD while in the presence of [name], MD.  Subjective:    Patient ID: [name] , female    DOB: 1951/03/18 , 73 y.o.   MRN: [id number]  Chief Complaint  Patient presents with   Annual Exam    Patient here for annual exam. She is no longer followed by a GYN provider. She reports compliance with meds. She denies having any headaches, chest pain and shortness of breath. Patient has no other questions or concerns today.   Hypertension   Diabetes    HPI Discussed the use of AI scribe software for clinical note transcription with the patient, who gave verbal consent to proceed.  History of Present Illness [name] is a 73 year old female with diabetes and hypertension who presents for a physical exam and follow-up on her left knee infection.  She is following up on her left knee infection after undergoing a knee replacement revision on March 30, 2024, due to a bacterial infection identified during the procedure. She has been on antibiotics since then, initially receiving intravenous antibiotics for six weeks and currently taking oral Augmentin  875 mg. She notes improvement in her knee symptoms since the revision surgery.  Her blood sugar levels have ranged from 78 to 132 mg/dL.  She experiences back pain, which worsens with prolonged standing. She uses a cane for mobility support but can manage short distances without it. She has a history of back issues, which were problematic around the time of her physical last year.  She has been incorporating sauerkraut, kimchi, and nuts into her diet to support gut health following prolonged antibiotic use. She has two bowel movements daily and no current diarrhea.  She is gradually resuming driving short distances and engaging in activities she enjoys, such  as gardening.   Diabetes She presents for her follow-up diabetic visit. She has type 2 diabetes mellitus. Her disease course has been improving. There are no hypoglycemic associated symptoms. There are no diabetic associated symptoms. Pertinent negatives for diabetes include no blurred vision. There are no hypoglycemic complications. Risk factors for coronary artery disease include diabetes mellitus, hypertension, obesity and post-menopausal. She is compliant with treatment most of the time. Her breakfast blood glucose is taken between 8-9 am. Her breakfast blood glucose range is generally 70-90 mg/dl. Eye exam is current.  Hypertension This is a chronic problem. The current episode started more than 1 year ago. The problem is unchanged. The problem is controlled. Pertinent negatives include no blurred vision, neck pain, orthopnea or PND. Past treatments include angiotensin blockers, beta blockers and diuretics. The current treatment provides moderate improvement. There are no compliance problems.  There is no history of chronic renal disease or coarctation of the aorta.     Past Medical History:  Diagnosis Date   Allergy 1995   Arthritis 1990   Diabetes mellitus without complication (HCC)    Encounter for annual health examination 04/17/2023   GERD (gastroesophageal reflux disease)    Headache    High cholesterol    Hypertension    Sleep apnea    mild- no CPAP   Stroke (HCC) 07/17/2003   mini- stroke due to HTN     Family History  Problem Relation Age of Onset   Cancer Mother    Hypertension Mother  Cancer Maternal Grandmother    Hypertension Maternal Grandmother    Arthritis Maternal Grandmother    Cancer Sister      Current Outpatient Medications:    acetaminophen  (TYLENOL ) 500 MG tablet, Take 1,000 mg by mouth daily as needed (pain.)., Disp: , Rfl:    Alcohol  Swabs (DROPSAFE ALCOHOL  PREP) 70 % PADS, USE AS DIRECTED WHEN CHECKING BLOOD SUGARS, Disp: 300 each, Rfl: 3    amoxicillin -clavulanate (AUGMENTIN ) 875-125 MG tablet, Take 1 tablet by mouth 2 (two) times daily., Disp: 60 tablet, Rfl: 5   Blood Glucose Calibration (TRUE METRIX LEVEL 1) Low SOLN, USE AS DIRECTED WITH GLUCOSE METER, Disp: 1 each, Rfl: 3   Blood Glucose Monitoring Suppl (TRUE METRIX AIR GLUCOSE METER) w/Device KIT, Inject 1 kit into the skin daily. Use as directed to check blood sugars 1 time per dx: e11.22, Disp: 1 kit, Rfl: 1   Cholecalciferol  (VITAMIN D ) 2000 UNITS tablet, Take 2,000 Units by mouth every evening., Disp: , Rfl:    glucose blood (TRUE METRIX BLOOD GLUCOSE TEST) test strip, USE AS INSTRUCTED, Disp: 300 strip, Rfl: 3   latanoprost  (XALATAN ) 0.005 % ophthalmic solution, Place 1 drop into both eyes at bedtime., Disp: , Rfl:    levocetirizine (XYZAL ) 5 MG tablet, Take 5 mg by mouth daily as needed for allergies., Disp: , Rfl:    metFORMIN  (GLUCOPHAGE -XR) 750 MG 24 hr tablet, TAKE 1 TABLET EVERY DAY WITH EVENING MEAL, Disp: 90 tablet, Rfl: 3   montelukast  (SINGULAIR ) 10 MG tablet, TAKE 1 TABLET(10 MG) BY MOUTH DAILY, Disp: 30 tablet, Rfl: 2   Multiple Vitamin (MULTIVITAMIN WITH MINERALS) TABS tablet, Take 1 tablet by mouth daily in the afternoon., Disp: , Rfl:    Olmesartan -amLODIPine -HCTZ 40-5-12.5 MG TABS, TAKE 1 TABLET EVERY DAY, Disp: 90 tablet, Rfl: 3   omeprazole  (PRILOSEC) 40 MG capsule, Take 1 capsule (40 mg total) by mouth daily for 21 days., Disp: 21 capsule, Rfl: 0   Semaglutide , 2 MG/DOSE, (OZEMPIC , 2 MG/DOSE,) 8 MG/3ML SOPN, Inject 2 mg into the skin once a week., Disp: 3 mL, Rfl: 3   Triamcinolone  Acetonide (NASACORT  AQ NA), Place 1-2 sprays into both nostrils daily as needed (nasal congestion/allergies.)., Disp: , Rfl:    atorvastatin  (LIPITOR) 10 MG tablet, Take one tab po daily M-Friday and skip Saturday and Sundays (Patient not taking: Reported on 06/10/2024), Disp: 90 tablet, Rfl: 1   gabapentin  (NEURONTIN ) 300 MG capsule, Take 1 capsule (300 mg total) by mouth at  bedtime. (Patient not taking: Reported on 06/10/2024), Disp: 90 capsule, Rfl: 2   methocarbamol  (ROBAXIN ) 500 MG tablet, Take by mouth. (Patient not taking: Reported on 06/10/2024), Disp: , Rfl:    oxyCODONE  (OXY IR/ROXICODONE ) 5 MG immediate release tablet, Take 5 mg by mouth every 4 (four) hours as needed. (Patient not taking: Reported on 06/10/2024), Disp: , Rfl:    TRUEplus Lancets 33G MISC, USE AS DIRECTED, Disp: 300 each, Rfl: 3   Allergies  Allergen Reactions   Codeine Nausea And Vomiting and Diarrhea   Meperidine      Other reaction(s): Delusions (intolerance)      The patient states she uses none for birth control. Patient's last menstrual period was 01/23/2013.. Negative for Dysmenorrhea. Negative for: breast discharge, breast lump(s), breast pain and breast self exam. Associated symptoms include abnormal vaginal bleeding. Pertinent negatives include abnormal bleeding (hematology), anxiety, decreased libido, depression, difficulty falling sleep, dyspareunia, history of infertility, nocturia, sexual dysfunction, sleep disturbances, urinary incontinence, urinary urgency, vaginal discharge and vaginal  itching. Diet regular.The patient states her exercise level is  minimal now, recently had TKR revision.  . The patient's tobacco use is:  Social History   Tobacco Use  Smoking Status Never  Smokeless Tobacco Never  . She has been exposed to passive smoke. The patient's alcohol  use is:  Social History   Substance and Sexual Activity  Alcohol  Use Yes   Alcohol /week: 1.0 standard drink of alcohol    Types: 1 Shots of liquor per week   Comment: occassionally     Review of Systems  Constitutional: Negative.   HENT: Negative.    Eyes: Negative.  Negative for blurred vision.  Respiratory: Negative.    Cardiovascular: Negative.  Negative for orthopnea and PND.  Gastrointestinal: Negative.   Endocrine: Negative.   Genitourinary: Negative.   Musculoskeletal: Negative.  Negative for  neck pain.  Skin: Negative.   Allergic/Immunologic: Negative.   Neurological: Negative.   Hematological: Negative.   Psychiatric/Behavioral: Negative.       Today's Vitals   06/10/24 1146  BP: 120/78  Pulse: 77  Temp: 98.7 F (37.1 C)  SpO2: 98%  Weight: 252 lb 12.8 oz (114.7 kg)  Height: 5' 6 (1.676 m)   Body mass index is 40.8 kg/m.  Wt Readings from Last 3 Encounters:  06/10/24 252 lb 12.8 oz (114.7 kg)  03/30/24 243 lb (110.2 kg)  03/19/24 243 lb (110.2 kg)     Objective:  Physical Exam Vitals and nursing note reviewed.  Constitutional:      Appearance: Normal appearance. She is obese.  HENT:     Head: Normocephalic and atraumatic.     Right Ear: Tympanic membrane, ear canal and external ear normal.     Left Ear: Tympanic membrane, ear canal and external ear normal.     Nose: Nose normal.     Mouth/Throat:     Mouth: Mucous membranes are moist.     Pharynx: Oropharynx is clear.  Eyes:     Extraocular Movements: Extraocular movements intact.     Conjunctiva/sclera: Conjunctivae normal.     Pupils: Pupils are equal, round, and reactive to light.  Cardiovascular:     Rate and Rhythm: Normal rate and regular rhythm.     Pulses: Normal pulses.          Dorsalis pedis pulses are 2+ on the right side and 2+ on the left side.     Heart sounds: Normal heart sounds.  Pulmonary:     Effort: Pulmonary effort is normal.     Breath sounds: Normal breath sounds.  Chest:  Breasts:    Tanner Score is 5.     Right: Normal.     Left: Normal.  Abdominal:     General: Bowel sounds are normal.     Palpations: Abdomen is soft.     Comments: Rounded, soft.   Genitourinary:    Comments: deferred Musculoskeletal:        General: Normal range of motion.     Cervical back: Normal range of motion and neck supple.     Comments: Left knee w/ healing surgical scar  Feet:     Right foot:     Protective Sensation: 5 sites tested.  5 sites sensed.     Skin integrity: Dry skin  present.     Toenail Condition: Right toenails are long.     Left foot:     Protective Sensation: 5 sites tested.  5 sites sensed.     Skin integrity: Dry skin  present.     Toenail Condition: Left toenails are long.  Skin:    General: Skin is warm and dry.  Neurological:     General: No focal deficit present.     Mental Status: She is alert and oriented to person, place, and time.  Psychiatric:        Mood and Affect: Mood normal.        Behavior: Behavior normal.         Assessment And Plan:     Encounter for annual health examination Assessment & Plan: A full exam was performed.  Importance of monthly self breast exams was discussed with the patient.  She is advised to get 30-45 minutes of regular exercise, no less than four to five days per week. Both weight-bearing and aerobic exercises are recommended.  She is advised to follow a healthy diet with at least six fruits/veggies per day, decrease intake of red meat and other saturated fats and to increase fish intake to twice weekly.  Meats/fish should not be fried -- baked, boiled or broiled is preferable. It is also important to cut back on your sugar intake.  Be sure to read labels - try to avoid anything with added sugar, high fructose corn syrup or other sweeteners.  If you must use a sweetener, you can try stevia or monkfruit.  It is also important to avoid artificially sweetened foods/beverages and diet drinks. Lastly, wear SPF 50 sunscreen on exposed skin and when in direct sunlight for an extended period of time.  Be sure to avoid fast food restaurants and aim for at least 60 ounces of water  daily.    - Administered flu shot. - Advised to wait on shingles vaccine as per infectious disease specialist. - Encouraged incorporation of bone broth into diet. - Encouraged consumption of healthy fats such as guacamole, avocado, and nuts.   Hypertensive heart disease without heart failure Assessment & Plan: Chronic, well controlled.   She will continue with olmesartan /amlodipine /hydrochlorothiazide  40/5/12.5mg  daily.  She is reminded to follow a low sodium diet.  - Follow up in 3-4 months.  Orders: -     Lipid panel -     POCT urinalysis dipstick -     Microalbumin / creatinine urine ratio -     EKG 12-Lead -     TSH  Aortic atherosclerosis Assessment & Plan: Chronic, LDL goal is less than 70.  She is not currently on statin therapy. In the past, she has not tolerated daily dosing of statins - Consider trial of rosuvastatin, since previously intolerant of atorvastatin    Dyslipidemia associated with type 2 diabetes mellitus (HCC) Assessment & Plan: Chronic, diabetic foot exam was performed.  She is currently taking metformin  XR 750mg  daily along with semaglutide  2mg  weekly. I will check labs as below. Regarding lipids, LDL goal is less than 70.   - Previously on atorvastatin , she is not currently taking this.  - Encouraged compliance to decrease cardiac risk - I DISCUSSED WITH THE PATIENT AT LENGTH REGARDING THE GOALS OF GLYCEMIC CONTROL AND POSSIBLE LONG-TERM COMPLICATIONS.  I  ALSO STRESSED THE IMPORTANCE OF COMPLIANCE WITH HOME GLUCOSE MONITORING, DIETARY RESTRICTIONS INCLUDING AVOIDANCE OF SUGARY DRINKS/PROCESSED FOODS,  ALONG WITH REGULAR EXERCISE.  I  ALSO STRESSED THE IMPORTANCE OF ANNUAL EYE EXAMS, SELF FOOT CARE AND COMPLIANCE WITH OFFICE VISITS.   Orders: -     Lipid panel -     TSH  Instability of internal left knee prosthesis, sequela Assessment & Plan: She  is s/p revision. Unfortunately, now being treated for prosthetic joint infection. ID notes reviewed. Currently on Augmentin .   Morbid obesity with BMI of 40.0-44.9, adult (HCC) Assessment & Plan: BMI 40. She is encouraged to strive to lose ten percent of her body weight to decrease cardiac risk.  - Gradually increase daily activity as tolerated.   Immunization due -     Flu vaccine HIGH DOSE PF(Fluzone Trivalent)    Return in 3 months (on  09/10/2024), or dm check, for 1 year physical. Patient was given opportunity to ask questions. Patient verbalized understanding of the plan and was able to repeat key elements of the plan. All questions were answered to their satisfaction.   I, [name], MD, have reviewed all documentation for this visit. The documentation on 06/10/24 for the exam, diagnosis, procedures, and orders are all accurate and complete.

## 2024-06-12 ENCOUNTER — Ambulatory Visit: Payer: Self-pay | Admitting: Internal Medicine

## 2024-06-12 LAB — TSH: TSH: 1.73 u[IU]/mL (ref 0.450–4.500)

## 2024-06-12 LAB — MICROALBUMIN / CREATININE URINE RATIO
Creatinine, Urine: 85.2 mg/dL
Microalb/Creat Ratio: 11 mg/g{creat} (ref 0–29)
Microalbumin, Urine: 9.6 ug/mL

## 2024-06-12 LAB — LIPID PANEL
Chol/HDL Ratio: 2.8 ratio (ref 0.0–4.4)
Cholesterol, Total: 174 mg/dL (ref 100–199)
HDL: 63 mg/dL (ref >39)
LDL Chol Calc (NIH): 95 mg/dL (ref 0–99)
Triglycerides: 89 mg/dL (ref 0–149)
VLDL Cholesterol Cal: 16 mg/dL (ref 5–40)

## 2024-06-18 ENCOUNTER — Other Ambulatory Visit: Payer: Self-pay | Admitting: Internal Medicine

## 2024-06-21 NOTE — Assessment & Plan Note (Signed)
 Chronic, well controlled.  She will continue with olmesartan /amlodipine /hydrochlorothiazide  40/5/12.5mg  daily.  She is reminded to follow a low sodium diet.  - Follow up in 3-4 months.

## 2024-06-21 NOTE — Assessment & Plan Note (Signed)
 Chronic, diabetic foot exam was performed.  She is currently taking metformin  XR 750mg  daily along with semaglutide  2mg  weekly. I will check labs as below. Regarding lipids, LDL goal is less than 70.   - Previously on atorvastatin , she is not currently taking this.  - Encouraged compliance to decrease cardiac risk - I DISCUSSED WITH THE PATIENT AT LENGTH REGARDING THE GOALS OF GLYCEMIC CONTROL AND POSSIBLE LONG-TERM COMPLICATIONS.  I  ALSO STRESSED THE IMPORTANCE OF COMPLIANCE WITH HOME GLUCOSE MONITORING, DIETARY RESTRICTIONS INCLUDING AVOIDANCE OF SUGARY DRINKS/PROCESSED FOODS,  ALONG WITH REGULAR EXERCISE.  I  ALSO STRESSED THE IMPORTANCE OF ANNUAL EYE EXAMS, SELF FOOT CARE AND COMPLIANCE WITH OFFICE VISITS.

## 2024-06-21 NOTE — Assessment & Plan Note (Signed)
 BMI 40. She is encouraged to strive to lose ten percent of her body weight to decrease cardiac risk.  - Gradually increase daily activity as tolerated.

## 2024-06-21 NOTE — Assessment & Plan Note (Signed)
 Chronic, LDL goal is less than 70.  She is not currently on statin therapy. In the past, she has not tolerated daily dosing of statins - Consider trial of rosuvastatin, since previously intolerant of atorvastatin 

## 2024-06-21 NOTE — Assessment & Plan Note (Addendum)
 She is s/p revision. Unfortunately, now being treated for prosthetic joint infection. ID notes reviewed. Currently on Augmentin .

## 2024-06-25 ENCOUNTER — Ambulatory Visit: Admitting: Internal Medicine

## 2024-06-27 ENCOUNTER — Other Ambulatory Visit: Payer: Self-pay | Admitting: Internal Medicine

## 2024-07-21 ENCOUNTER — Encounter: Payer: Self-pay | Admitting: Internal Medicine

## 2024-07-21 ENCOUNTER — Other Ambulatory Visit: Payer: Self-pay

## 2024-07-21 ENCOUNTER — Ambulatory Visit: Admitting: Internal Medicine

## 2024-07-21 VITALS — BP 127/78 | HR 84 | Temp 97.5°F | Ht 66.0 in | Wt 249.0 lb

## 2024-07-21 DIAGNOSIS — Z7961 Long term (current) use of immunomodulator: Secondary | ICD-10-CM

## 2024-07-21 DIAGNOSIS — T8454XD Infection and inflammatory reaction due to internal left knee prosthesis, subsequent encounter: Secondary | ICD-10-CM

## 2024-07-21 DIAGNOSIS — T8450XS Infection and inflammatory reaction due to unspecified internal joint prosthesis, sequela: Secondary | ICD-10-CM

## 2024-07-21 DIAGNOSIS — M069 Rheumatoid arthritis, unspecified: Secondary | ICD-10-CM | POA: Diagnosis not present

## 2024-07-21 NOTE — Progress Notes (Signed)
 "         Patient Active Problem List   Diagnosis Date Noted   Morbid obesity with BMI of 40.0-44.9, adult (HCC) 06/10/2024   Instability of internal left knee prosthesis 03/30/2024   Instability of left knee joint 12/16/2023   Hypertensive heart disease without heart failure 09/17/2023   Lumbar radiculopathy 09/17/2023   Preoperative clearance 06/16/2023   Seasonal allergic rhinitis 05/20/2023   Right ear pain 05/20/2023   Postnasal drip 05/20/2023   Otalgia of right ear 05/20/2023   Encounter for annual health examination 04/17/2023   Aortic atherosclerosis 04/17/2023   Estrogen deficiency 04/17/2023   Dyslipidemia associated with type 2 diabetes mellitus (HCC) 12/07/2021   Pure hypercholesterolemia 12/07/2021   Arthritis of hand 07/01/2021   Pain in right hand 06/29/2021   Pain of left hand 06/29/2021   Carpal tunnel syndrome 06/29/2021   Pain in thoracic spine 10/07/2020   OSA on CPAP 07/30/2018   Left hip pain 07/02/2018   Snoring 02/27/2018   Intermittent sleepiness 02/27/2018   Sleep pattern disturbance 02/27/2018   Obesity (BMI 35.0-39.9 without comorbidity) 02/27/2018   Essential hypertension, benign 02/27/2018   Hyperlipidemia 02/20/2013   Hypertension 02/20/2013    Patient's Medications  New Prescriptions   No medications on file  Previous Medications   ACETAMINOPHEN  (TYLENOL ) 500 MG TABLET    Take 1,000 mg by mouth daily as needed (pain.).   ALCOHOL  SWABS (DROPSAFE ALCOHOL  PREP) 70 % PADS    USE AS DIRECTED WHEN CHECKING BLOOD SUGARS   AMOXICILLIN -CLAVULANATE (AUGMENTIN ) 875-125 MG TABLET    Take 1 tablet by mouth 2 (two) times daily.   ATORVASTATIN  (LIPITOR) 10 MG TABLET    Take one tab po daily M-Friday and skip Saturday and Sundays   BLOOD GLUCOSE CALIBRATION (TRUE METRIX LEVEL 1) LOW SOLN    USE AS DIRECTED WITH GLUCOSE METER   BLOOD GLUCOSE MONITORING SUPPL (TRUE METRIX AIR GLUCOSE METER) W/DEVICE KIT    Inject 1 kit into the skin daily. Use as  directed to check blood sugars 1 time per dx: e11.22   CHOLECALCIFEROL  (VITAMIN D ) 2000 UNITS TABLET    Take 2,000 Units by mouth every evening.   GABAPENTIN  (NEURONTIN ) 300 MG CAPSULE    Take 1 capsule (300 mg total) by mouth at bedtime.   GLUCOSE BLOOD (TRUE METRIX BLOOD GLUCOSE TEST) TEST STRIP    USE AS INSTRUCTED   LATANOPROST  (XALATAN ) 0.005 % OPHTHALMIC SOLUTION    Place 1 drop into both eyes at bedtime.   LEVOCETIRIZINE (XYZAL ) 5 MG TABLET    Take 5 mg by mouth daily as needed for allergies.   METFORMIN  (GLUCOPHAGE -XR) 750 MG 24 HR TABLET    TAKE 1 TABLET EVERY DAY WITH EVENING MEAL   METHOCARBAMOL  (ROBAXIN ) 500 MG TABLET    Take by mouth.   MONTELUKAST  (SINGULAIR ) 10 MG TABLET    TAKE 1 TABLET(10 MG) BY MOUTH DAILY   MULTIPLE VITAMIN (MULTIVITAMIN WITH MINERALS) TABS TABLET    Take 1 tablet by mouth daily in the afternoon.   OLMESARTAN -AMLODIPINE -HCTZ 40-5-12.5 MG TABS    TAKE 1 TABLET EVERY DAY   OMEPRAZOLE  (PRILOSEC) 40 MG CAPSULE    Take 1 capsule (40 mg total) by mouth daily for 21 days.   OXYCODONE  (OXY IR/ROXICODONE ) 5 MG IMMEDIATE RELEASE TABLET    Take 5 mg by mouth every 4 (four) hours as needed.   SEMAGLUTIDE , 2 MG/DOSE, (OZEMPIC , 2 MG/DOSE,) 8 MG/3ML SOPN    Inject 2 mg into  the skin once a week.   TRIAMCINOLONE  ACETONIDE (NASACORT  AQ NA)    Place 1-2 sprays into both nostrils daily as needed (nasal congestion/allergies.).   TRUEPLUS LANCETS 33G MISC    USE AS DIRECTED  Modified Medications   No medications on file  Discontinued Medications   No medications on file    Subjective: 74 y.o. Rheumatoid arthritis on hydroxychloroquine and leflunomide presents for concern for left knee PJI.  Patient had underwent left total knee arthroplasty on August 05, 2023 for prior right osteoarthritis. Struggled after surgery regaining strength and mobility.  Given concern of excessive laxity in her knee in both flexion and extension she was taken to the OR on 03/30/2024 with Dr. Marchione  for revision of left total knee arthroplasty, liner exchange, synovectomy scar excision.  No purulence commented per OR note.  Culture was obtained from circumferential synovectomy which grew corynebacterium minutissimum.  ID was called on 9/19 recommended start linezolid  as a bridge to ID appointment. Past medical history of diabetes mellitus hypertension, hypercholesterolemia, CVA  Today: tolerating augmentin  Review of Systems: Review of Systems  All other systems reviewed and are negative.   Past Medical History:  Diagnosis Date   Allergy 1995   Arthritis 1990   Diabetes mellitus without complication (HCC)    Encounter for annual health examination 04/17/2023   GERD (gastroesophageal reflux disease)    Headache    High cholesterol    Hypertension    Sleep apnea    mild- no CPAP   Stroke (HCC) 07/17/2003   mini- stroke due to HTN    Social History[1]  Family History  Problem Relation Age of Onset   Cancer Mother    Hypertension Mother    Cancer Maternal Grandmother    Hypertension Maternal Grandmother    Arthritis Maternal Grandmother    Cancer Sister     Allergies[2]  Health Maintenance  Topic Date Due   COVID-19 Vaccine (3 - Pfizer risk series) 11/13/2019   FOOT EXAM  04/15/2024   Zoster Vaccines- Shingrix (1 of 2) 09/10/2024 (Originally 11/28/1969)   OPHTHALMOLOGY EXAM  07/31/2024   HEMOGLOBIN A1C  09/16/2024   Mammogram  11/03/2024   Medicare Annual Wellness (AWV)  03/04/2025   Diabetic kidney evaluation - eGFR measurement  05/27/2025   Diabetic kidney evaluation - Urine ACR  06/10/2025   DTaP/Tdap/Td (2 - Td or Tdap) 12/27/2027   Colonoscopy  12/11/2032   Pneumococcal Vaccine: 50+ Years  Completed   Influenza Vaccine  Completed   Bone Density Scan  Completed   Hepatitis C Screening  Completed   Meningococcal B Vaccine  Aged Out    Objective:  Vitals:   07/21/24 1402  BP: 127/78  Pulse: 84  Temp: (!) 97.5 F (36.4 C)  TempSrc: Temporal  SpO2:  95%  Weight: 249 lb (112.9 kg)  Height: 5' 6 (1.676 m)   Body mass index is 40.19 kg/m.  Physical Exam Constitutional:      Appearance: Normal appearance.  HENT:     Head: Normocephalic and atraumatic.     Right Ear: Tympanic membrane normal.     Left Ear: Tympanic membrane normal.     Nose: Nose normal.     Mouth/Throat:     Mouth: Mucous membranes are moist.  Eyes:     Extraocular Movements: Extraocular movements intact.     Conjunctiva/sclera: Conjunctivae normal.     Pupils: Pupils are equal, round, and reactive to light.  Cardiovascular:     Rate and  Rhythm: Normal rate and regular rhythm.     Heart sounds: No murmur heard.    No friction rub. No gallop.  Pulmonary:     Effort: Pulmonary effort is normal.     Breath sounds: Normal breath sounds.  Abdominal:     General: Abdomen is flat.     Palpations: Abdomen is soft.  Skin:    General: Skin is warm and dry.  Neurological:     General: No focal deficit present.     Mental Status: She is alert and oriented to person, place, and time.  Psychiatric:        Mood and Affect: Mood normal.    Lab Results Lab Results  Component Value Date   WBC 4.4 04/15/2024   HGB 12.9 04/15/2024   HCT 40.9 04/15/2024   MCV 81.3 04/15/2024   PLT 282 04/15/2024    Lab Results  Component Value Date   CREATININE 0.71 04/15/2024   BUN 20 04/15/2024   NA 138 04/15/2024   K 4.0 04/15/2024   CL 103 04/15/2024   CO2 26 04/15/2024    Lab Results  Component Value Date   ALT 9 04/15/2024   AST 12 04/15/2024   ALKPHOS 90 03/19/2024   BILITOT 0.4 04/15/2024    Lab Results  Component Value Date   CHOL 174 06/10/2024   HDL 63 06/10/2024   LDLCALC 95 06/10/2024   TRIG 89 06/10/2024   CHOLHDL 2.8 06/10/2024   No results found for: LABRPR, RPRTITER No results found for: HIV1RNAQUANT, HIV1RNAVL, CD4TABS   Problem List Items Addressed This Visit   None  Results   Assessment/Plan #Left knee PJI #RA on HCQ and  leflunemide eft TKA on August 05, 2023 complicated by instability underwent revision on 915/25 synovectomy cultures grew corynebacterium minutissimum.  ID was called, per documentation 9/19 per Dr. Issac No concern of infection in OR blood cultures unexpectedly grew corynebacterium asking if patient needs to be on IV antibiotics.  Placed on linezolid  till ID appointment. Plan -will treat as pji given pt stated knee was erythematous and swollen follwing initial OR in jaunary, also some concern that she may have been taking leflunemide at that time. She thinks she stopped in oct 2024, refill 90 day dispense history  in November. - Patient completed daptomycin x 6 weeks EOT 11/17.  She stated knee pain persists, has seen Ortho in the interim.  Denies any muscle soreness.  Reviewed labs as below overall stable.  Wound without signs of active infection.  Transitioned to augmentin  on 11/18 Plan; Continue augmentin  x 6 months eot 11/30/24 Labs today  F/U in May Loney Stank, MD [facility] 07/21/2024, 2:18 PM  I personally spent a total of 106 minutes in the care of the patient today including preparing to see the patient, getting/reviewing separately obtained history, performing a medically appropriate exam/evaluation, counseling and educating, placing orders, documenting clinical information in the EHR, independently interpreting results, and communicating results.     [1]  Social History Tobacco Use   Smoking status: Never   Smokeless tobacco: Never  Vaping Use   Vaping status: Never Used  Substance Use Topics   Alcohol  use: Yes    Alcohol /week: 1.0 standard drink of alcohol     Types: 1 Shots of liquor per week    Comment: occassionally   Drug use: No  [2]  Allergies Allergen Reactions   Codeine Nausea And Vomiting and Diarrhea   Meperidine   Other reaction(s): Delusions (intolerance)   "

## 2024-07-22 LAB — CBC WITH DIFFERENTIAL/PLATELET
Absolute Lymphocytes: 1775 {cells}/uL (ref 850–3900)
Absolute Monocytes: 516 {cells}/uL (ref 200–950)
Basophils Absolute: 29 {cells}/uL (ref 0–200)
Basophils Relative: 0.5 %
Eosinophils Absolute: 99 {cells}/uL (ref 15–500)
Eosinophils Relative: 1.7 %
HCT: 45 % (ref 35.9–46.0)
Hemoglobin: 13.9 g/dL (ref 11.7–15.5)
MCH: 24.8 pg — ABNORMAL LOW (ref 27.0–33.0)
MCHC: 30.9 g/dL — ABNORMAL LOW (ref 31.6–35.4)
MCV: 80.2 fL — ABNORMAL LOW (ref 81.4–101.7)
MPV: 10.7 fL (ref 7.5–12.5)
Monocytes Relative: 8.9 %
Neutro Abs: 3381 {cells}/uL (ref 1500–7800)
Neutrophils Relative %: 58.3 %
Platelets: 409 Thousand/uL — ABNORMAL HIGH (ref 140–400)
RBC: 5.61 Million/uL — ABNORMAL HIGH (ref 3.80–5.10)
RDW: 15.1 % — ABNORMAL HIGH (ref 11.0–15.0)
Total Lymphocyte: 30.6 %
WBC: 5.8 Thousand/uL (ref 3.8–10.8)

## 2024-07-22 LAB — COMPLETE METABOLIC PANEL WITHOUT GFR
AG Ratio: 1.6 (calc) (ref 1.0–2.5)
ALT: 20 U/L (ref 6–29)
AST: 17 U/L (ref 10–35)
Albumin: 4.1 g/dL (ref 3.6–5.1)
Alkaline phosphatase (APISO): 88 U/L (ref 37–153)
BUN: 20 mg/dL (ref 7–25)
CO2: 26 mmol/L (ref 20–32)
Calcium: 9.7 mg/dL (ref 8.6–10.4)
Chloride: 106 mmol/L (ref 98–110)
Creat: 0.72 mg/dL (ref 0.60–1.00)
Globulin: 2.5 g/dL (ref 1.9–3.7)
Glucose, Bld: 123 mg/dL — ABNORMAL HIGH (ref 65–99)
Potassium: 3.9 mmol/L (ref 3.5–5.3)
Sodium: 141 mmol/L (ref 135–146)
Total Bilirubin: 0.4 mg/dL (ref 0.2–1.2)
Total Protein: 6.6 g/dL (ref 6.1–8.1)

## 2024-07-22 LAB — C-REACTIVE PROTEIN: CRP: 4.4 mg/L

## 2024-07-22 LAB — SEDIMENTATION RATE: Sed Rate: 9 mm/h (ref 0–30)

## 2024-08-03 ENCOUNTER — Encounter: Payer: Self-pay | Admitting: Internal Medicine

## 2024-09-10 ENCOUNTER — Ambulatory Visit: Admitting: Internal Medicine

## 2024-12-01 ENCOUNTER — Ambulatory Visit: Payer: Self-pay | Admitting: Internal Medicine

## 2025-04-07 ENCOUNTER — Ambulatory Visit: Payer: Self-pay

## 2025-06-16 ENCOUNTER — Encounter: Payer: Self-pay | Admitting: Internal Medicine
